# Patient Record
Sex: Female | Born: 1950 | Race: White | Hispanic: No | Smoking: Former smoker
Health system: Southern US, Community
[De-identification: ages and names within clinical notes are randomized; demographics above are authoritative.]

## PROBLEM LIST (undated history)

## (undated) ENCOUNTER — Ambulatory Visit: Admission: EM | Payer: Medicare PPO | Source: Home / Self Care

## (undated) DIAGNOSIS — M1990 Unspecified osteoarthritis, unspecified site: Secondary | ICD-10-CM

## (undated) DIAGNOSIS — C801 Malignant (primary) neoplasm, unspecified: Secondary | ICD-10-CM

## (undated) DIAGNOSIS — I472 Ventricular tachycardia, unspecified: Secondary | ICD-10-CM

## (undated) DIAGNOSIS — I10 Essential (primary) hypertension: Secondary | ICD-10-CM

## (undated) DIAGNOSIS — Z87442 Personal history of urinary calculi: Secondary | ICD-10-CM

## (undated) DIAGNOSIS — H3530 Unspecified macular degeneration: Secondary | ICD-10-CM

## (undated) DIAGNOSIS — G2581 Restless legs syndrome: Secondary | ICD-10-CM

## (undated) DIAGNOSIS — F329 Major depressive disorder, single episode, unspecified: Secondary | ICD-10-CM

## (undated) DIAGNOSIS — F32A Depression, unspecified: Secondary | ICD-10-CM

## (undated) DIAGNOSIS — E7800 Pure hypercholesterolemia, unspecified: Secondary | ICD-10-CM

## (undated) DIAGNOSIS — R011 Cardiac murmur, unspecified: Secondary | ICD-10-CM

## (undated) DIAGNOSIS — F419 Anxiety disorder, unspecified: Secondary | ICD-10-CM

## (undated) DIAGNOSIS — G4733 Obstructive sleep apnea (adult) (pediatric): Secondary | ICD-10-CM

## (undated) DIAGNOSIS — N200 Calculus of kidney: Secondary | ICD-10-CM

## (undated) DIAGNOSIS — T884XXA Failed or difficult intubation, initial encounter: Secondary | ICD-10-CM

## (undated) HISTORY — DX: Unspecified macular degeneration: H35.30

## (undated) HISTORY — DX: Restless legs syndrome: G25.81

## (undated) HISTORY — DX: Major depressive disorder, single episode, unspecified: F32.9

## (undated) HISTORY — DX: Unspecified osteoarthritis, unspecified site: M19.90

## (undated) HISTORY — DX: Anxiety disorder, unspecified: F41.9

## (undated) HISTORY — DX: Pure hypercholesterolemia, unspecified: E78.00

## (undated) HISTORY — DX: Cardiac murmur, unspecified: R01.1

## (undated) HISTORY — PX: OTHER SURGICAL HISTORY: SHX169

## (undated) HISTORY — PX: REPLACEMENT TOTAL KNEE BILATERAL: SUR1225

## (undated) HISTORY — DX: Obstructive sleep apnea (adult) (pediatric): G47.33

## (undated) HISTORY — DX: Essential (primary) hypertension: I10

## (undated) HISTORY — PX: APPENDECTOMY: SHX54

## (undated) HISTORY — DX: Depression, unspecified: F32.A

## (undated) HISTORY — PX: ABDOMINAL HYSTERECTOMY: SHX81

## (undated) HISTORY — DX: Calculus of kidney: N20.0

## (undated) HISTORY — PX: CHOLECYSTECTOMY: SHX55

---

## 2014-02-13 DIAGNOSIS — D803 Selective deficiency of immunoglobulin G [IgG] subclasses: Secondary | ICD-10-CM | POA: Insufficient documentation

## 2014-02-13 DIAGNOSIS — Z87442 Personal history of urinary calculi: Secondary | ICD-10-CM | POA: Insufficient documentation

## 2014-02-13 DIAGNOSIS — G2581 Restless legs syndrome: Secondary | ICD-10-CM | POA: Insufficient documentation

## 2014-02-13 DIAGNOSIS — J45901 Unspecified asthma with (acute) exacerbation: Secondary | ICD-10-CM | POA: Insufficient documentation

## 2014-02-13 DIAGNOSIS — Z9989 Dependence on other enabling machines and devices: Secondary | ICD-10-CM

## 2014-02-13 DIAGNOSIS — G4733 Obstructive sleep apnea (adult) (pediatric): Secondary | ICD-10-CM | POA: Insufficient documentation

## 2014-02-13 DIAGNOSIS — M1990 Unspecified osteoarthritis, unspecified site: Secondary | ICD-10-CM | POA: Insufficient documentation

## 2014-02-13 DIAGNOSIS — J45909 Unspecified asthma, uncomplicated: Secondary | ICD-10-CM | POA: Insufficient documentation

## 2014-02-13 DIAGNOSIS — E782 Mixed hyperlipidemia: Secondary | ICD-10-CM | POA: Insufficient documentation

## 2014-02-13 DIAGNOSIS — I10 Essential (primary) hypertension: Secondary | ICD-10-CM | POA: Insufficient documentation

## 2014-10-03 DIAGNOSIS — F3341 Major depressive disorder, recurrent, in partial remission: Secondary | ICD-10-CM | POA: Insufficient documentation

## 2014-10-03 DIAGNOSIS — F331 Major depressive disorder, recurrent, moderate: Secondary | ICD-10-CM | POA: Insufficient documentation

## 2014-10-13 DIAGNOSIS — E7212 Methylenetetrahydrofolate reductase deficiency: Secondary | ICD-10-CM | POA: Insufficient documentation

## 2014-10-13 DIAGNOSIS — Z1589 Genetic susceptibility to other disease: Secondary | ICD-10-CM | POA: Insufficient documentation

## 2015-02-11 DIAGNOSIS — F17200 Nicotine dependence, unspecified, uncomplicated: Secondary | ICD-10-CM | POA: Insufficient documentation

## 2015-05-14 DIAGNOSIS — E66813 Obesity, class 3: Secondary | ICD-10-CM | POA: Insufficient documentation

## 2015-09-02 DIAGNOSIS — R7301 Impaired fasting glucose: Secondary | ICD-10-CM | POA: Insufficient documentation

## 2016-09-07 ENCOUNTER — Encounter (HOSPITAL_COMMUNITY): Payer: Self-pay

## 2016-09-07 ENCOUNTER — Encounter (HOSPITAL_COMMUNITY): Payer: Self-pay | Admitting: Psychology

## 2016-09-07 ENCOUNTER — Ambulatory Visit (INDEPENDENT_AMBULATORY_CARE_PROVIDER_SITE_OTHER): Payer: Medicare Other | Admitting: Psychology

## 2016-09-07 DIAGNOSIS — F331 Major depressive disorder, recurrent, moderate: Secondary | ICD-10-CM | POA: Diagnosis not present

## 2016-09-07 DIAGNOSIS — Z634 Disappearance and death of family member: Secondary | ICD-10-CM | POA: Diagnosis not present
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
## 2016-09-21 ENCOUNTER — Ambulatory Visit (INDEPENDENT_AMBULATORY_CARE_PROVIDER_SITE_OTHER): Payer: Medicare Other | Admitting: Psychology

## 2016-09-21 DIAGNOSIS — F331 Major depressive disorder, recurrent, moderate: Secondary | ICD-10-CM | POA: Diagnosis not present

## 2016-09-21 DIAGNOSIS — Z634 Disappearance and death of family member: Secondary | ICD-10-CM

## 2016-09-22 NOTE — Progress Notes (Signed)
   THERAPIST PROGRESS NOTE  Session Time: 1.30pm-2.30pm  Participation Level: Active  Behavioral Response: Well GroomedAlertDepressed  Type of Therapy: Individual Therapy  Treatment Goals addressed: Diagnosis: MDD and goal 1.  Interventions: CBT and Other: tx planning  Summary: Alice Bowers is a 66 y.o. female who presents with affect WNL.  Pt discussed goals for tx and tx plan developed.   Pt reported that she is now staying in her rental in [place]- but still in the process of moving boxes from last rental.  Pt reported that she had several friends state would help but didn't- son in law has assisted in moving furniture.  Pt discussed how down sizing.  Pt discussed that her granddaughter was excited to see her house and her room.  Pt is staying connected w/ them- but fears that after a month will drop off. Pt is keeping communication open and offering support of being present at her biking events- plans to go this weekend w/ them.  Pt discussed that best friend is in ICU and her immediate response when she found out was to text her daughter and then realized she couldn't.  Pt discussed how this was difficult.  Pt discussed how her faith is being challenged- feels that prayers weren't answered.  Pt reports she did go to her daughter's church this weekend on a whim an did feel support here.     Suicidal/Homicidal: Nowithout intent/plan  Therapist Response:   Assessed pt current functioning per pt report.  Developed tx plan. Explored w/pt interactions over past couple of weeks.  Discussed stressors of move and supports.  Processed grieving and how faith is being challenged and still seeking support.  Validated and normalized.  Plan: Return again in 2 weeks.  Diagnosis: MDD    Jan Fireman, [facility] 09/22/2016

## 2016-10-04 ENCOUNTER — Ambulatory Visit (INDEPENDENT_AMBULATORY_CARE_PROVIDER_SITE_OTHER): Payer: Medicare Other | Admitting: Psychology

## 2016-10-04 DIAGNOSIS — F331 Major depressive disorder, recurrent, moderate: Secondary | ICD-10-CM | POA: Diagnosis not present

## 2016-10-04 DIAGNOSIS — Z634 Disappearance and death of family member: Secondary | ICD-10-CM

## 2016-10-05 NOTE — Progress Notes (Signed)
   THERAPIST PROGRESS NOTE  Session Time: 1.30pm-2.30pm  Participation Level: Active  Behavioral Response: Well GroomedAlertaffect wnl  Type of Therapy: Individual Therapy  Treatment Goals addressed: Diagnosis: MDD and goal 1.  Interventions: CBT and Supportive  Summary: Blakelee Certo is a 66 y.o. female who presents with report of feeling tired today.  Pt discussed how work has been stressful this week w/ increased volume.  Pt reported that work has been having her stay longer than her 4 hours to help- pt reports doesn't mind.  Pt reported that had positive weekend w/ her granddaughter and son-in-law at [employer] day.  Pt discussed how she has good days and bad days w/ mood and grief.  Pt reported that she slept through the night last night for the first time in months.  Pt has moved the majority of her things to new house and positive outlook about process of unpacking w/ reality that will take awhile.  Pt discussed her grieving of daughter and seeing this present for her granddaughter and son in law.  Pt discussed importance of remaining a support and feeling the support from the community her daughter was known in.   Suicidal/Homicidal: Nowithout intent/plan  Therapist Response: Assessed pt current functioning per pt report.  Processed w/pt her continued transition w/ move.  Explored w/ pt interactions w/ her family and how supporting each other through grief.  Discussed  her grieving process.   Plan: Return again in 2 weeks.  Diagnosis: MDD    Jan Fireman, [facility] 10/05/2016

## 2016-10-06 ENCOUNTER — Encounter (HOSPITAL_COMMUNITY): Payer: Self-pay | Admitting: Psychiatry

## 2016-10-06 ENCOUNTER — Ambulatory Visit (INDEPENDENT_AMBULATORY_CARE_PROVIDER_SITE_OTHER): Payer: Medicare Other | Admitting: Psychiatry

## 2016-10-06 VITALS — BP 138/82 | HR 85 | Ht 64.0 in | Wt 240.8 lb

## 2016-10-06 DIAGNOSIS — Z9889 Other specified postprocedural states: Secondary | ICD-10-CM

## 2016-10-06 DIAGNOSIS — Z818 Family history of other mental and behavioral disorders: Secondary | ICD-10-CM | POA: Diagnosis not present

## 2016-10-06 DIAGNOSIS — Z9049 Acquired absence of other specified parts of digestive tract: Secondary | ICD-10-CM | POA: Diagnosis not present

## 2016-10-06 DIAGNOSIS — Z882 Allergy status to sulfonamides status: Secondary | ICD-10-CM

## 2016-10-06 DIAGNOSIS — Z90710 Acquired absence of both cervix and uterus: Secondary | ICD-10-CM

## 2016-10-06 DIAGNOSIS — F17210 Nicotine dependence, cigarettes, uncomplicated: Secondary | ICD-10-CM

## 2016-10-06 DIAGNOSIS — Z79899 Other long term (current) drug therapy: Secondary | ICD-10-CM

## 2016-10-06 DIAGNOSIS — F331 Major depressive disorder, recurrent, moderate: Secondary | ICD-10-CM | POA: Diagnosis not present

## 2016-10-06 DIAGNOSIS — Z888 Allergy status to other drugs, medicaments and biological substances status: Secondary | ICD-10-CM

## 2016-10-06 MED ORDER — BUPROPION HCL ER (SR) 150 MG PO TB12: ORAL_TABLET | ORAL | 1 refills | Status: DC

## 2016-10-06 MED ORDER — ESCITALOPRAM OXALATE 20 MG PO TABS: 20.0000 mg | ORAL_TABLET | Freq: Every day | ORAL | 1 refills | Status: DC
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
## 2016-10-13 ENCOUNTER — Ambulatory Visit (HOSPITAL_COMMUNITY): Payer: Medicare Other | Admitting: Psychology

## 2016-10-27 ENCOUNTER — Ambulatory Visit (HOSPITAL_COMMUNITY): Payer: Medicare Other | Admitting: Psychology

## 2016-11-01 ENCOUNTER — Encounter (HOSPITAL_COMMUNITY): Payer: Self-pay | Admitting: Psychiatry

## 2016-11-10 ENCOUNTER — Ambulatory Visit (INDEPENDENT_AMBULATORY_CARE_PROVIDER_SITE_OTHER): Payer: Medicare Other | Admitting: Psychology

## 2016-11-10 DIAGNOSIS — Z634 Disappearance and death of family member: Secondary | ICD-10-CM | POA: Diagnosis not present

## 2016-11-10 DIAGNOSIS — F331 Major depressive disorder, recurrent, moderate: Secondary | ICD-10-CM

## 2016-11-10 NOTE — Progress Notes (Signed)
   THERAPIST PROGRESS NOTE  Session Time: 1.30pm-2.23pm  Participation Level: Active  Behavioral Response: Well GroomedAlertDepressed  Type of Therapy: Individual Therapy  Treatment Goals addressed: Diagnosis: MDD, Bereavment and goal 1.  Interventions: CBT and Supportive  Summary: Alice Bowers is a 66 y.o. female who presents with report of being tired- pt reported has been working increased hours for past several weeks.  Pt reports not a lot of energy to get things done around house. Pt reported that she has continued to stay engaged w/ her granddauther and son-in-law.  Pt reported this is positive and able to talk about daughter w/ granddaughter.  Pt reported thinking a lot about her daughter over past 2 weeks and acknowledges this as norm.  Pt did report new stressor dx w/ macular degeneration-  Some worry about future and lack of support to care for potential needs in future.   Pt looking forward to sibling beach trip.  Suicidal/Homicidal: Nowithout intent/plan  Therapist Response: Assessed pt current functioning per pt report.  Processed w/ pt mood, grief and new medical stressor.  Reflected positive interactions and continued engagement and benefit.  Plan: Return again in 2 weeks.  Diagnosis: MDD    Jan Fireman, [facility] 11/10/2016

## 2016-11-22 ENCOUNTER — Encounter (HOSPITAL_COMMUNITY): Payer: Self-pay | Admitting: Psychiatry

## 2016-11-22 ENCOUNTER — Ambulatory Visit (INDEPENDENT_AMBULATORY_CARE_PROVIDER_SITE_OTHER): Payer: Medicare Other | Admitting: Psychiatry

## 2016-11-22 DIAGNOSIS — F331 Major depressive disorder, recurrent, moderate: Secondary | ICD-10-CM | POA: Diagnosis not present

## 2016-11-22 DIAGNOSIS — Z818 Family history of other mental and behavioral disorders: Secondary | ICD-10-CM | POA: Diagnosis not present

## 2016-11-22 DIAGNOSIS — F17210 Nicotine dependence, cigarettes, uncomplicated: Secondary | ICD-10-CM | POA: Diagnosis not present

## 2016-11-22 DIAGNOSIS — Z79899 Other long term (current) drug therapy: Secondary | ICD-10-CM | POA: Diagnosis not present

## 2016-11-22 MED ORDER — ESCITALOPRAM OXALATE 20 MG PO TABS: 20.0000 mg | ORAL_TABLET | Freq: Every day | ORAL | 2 refills | Status: DC

## 2016-11-22 MED ORDER — BUPROPION HCL ER (SR) 150 MG PO TB12: ORAL_TABLET | ORAL | 2 refills | Status: DC

## 2016-11-22 NOTE — Progress Notes (Signed)
[facility] MD/PA/NP OP Progress Note  11/22/2016 2:08 PM Alice Bowers  MRN:  [id number]  Chief Complaint:  Subjective:  I am doing okay.  Some time I cry when I remember my daughter who died in Sep 11, 2023.  I was also recently diagnosed with macular degeneration and I'm getting injection in my eye every 4 weeks.  HPI: Alice Bowers came for her follow-up appointment.  She is 66 year old Caucasian widowed part-time employed female who was seen first time on February 22nd .  Patient has a history of depression and she's been taking Wellbutrin and Lexapro prescribed by her previous psychiatrist Dr. Philippa Bowers.  Her psychiatrist left the practice.  Overall patient describes her depression is a stable but sometimes she didn't get sad depressed and tearful when she remember her daughter who died in 09-11-2023 due to leukemia.  She was also diagnosed 4 weeks ago with macular degeneration and getting injection in her eye every month.  Sometimes she gets very anxious and depressed about her future but she believed medicine helping her.  She is seeing Alice Bowers for counseling.  Her energy level is fair.  She sleeping good.  She denies any irritability, anger, mania or any psychosis.  Patient has multiple health issues which include restless leg, sleep apnea, hypertension, obesity, hyperlipidemia and recently diagnosed with macular degeneration.  Patient does not want to change her medication.  She does not want to add or reduce the dose.  She has no tremors shakes or any EPS.  She denies any feeling of hopelessness or worthlessness.  Patient is very close to her son-in-law and 2-year-old granddaughter who lives in [place].  Patient has a son who lives in [place].  Patient lives by herself.  Patient denies drinking alcohol or using any illegal substances.  Visit Diagnosis:    ICD-9-CM ICD-10-CM   1. Major depressive disorder, recurrent episode, moderate (HCC) 296.32 F33.1 buPROPion (WELLBUTRIN SR) 150 MG 12 hr tablet   escitalopram (LEXAPRO) 20 MG tablet    Past Psychiatric History: Patient endorse history of depression since 1995.  At that time she was going through divorce.  She has at least 4 psychiatric hospitalization.  She has one suicidal attempt by taking overdose on Seroquel.  Patient denies any history of paranoia, hallucination or any aggressive behavior.  In the past she had tried Paxil, Zoloft and Seroquel with limited response.  She tried Remeron but cause weight gain.    Past Medical History:  Past Medical History:  Diagnosis Date  . Anxiety   . Arthritis   . Depression   . Heart murmur   . High blood pressure   . High cholesterol   . Kidney stones   . Macular degeneration of left eye   . Obstructive sleep apnea   . Restless leg syndrome     Past Surgical History:  Procedure Laterality Date  . ABDOMINAL HYSTERECTOMY    . APPENDECTOMY    . arthroscopy left shoulder    . CHOLECYSTECTOMY    . joint fusion bilateral thumbs    . ORIF right ankle    . REPLACEMENT TOTAL KNEE BILATERAL      Family Psychiatric History: Reviewed.   Family History:  Family History  Problem Relation Age of Onset  . Depression Mother   . Depression Brother     Social History:  Social History   Social History  . Marital status: Widowed    Spouse name: N/A  . Number of children: N/A  . Years of education:  N/A   Social History Main Topics  . Smoking status: Current Every Day Smoker    Packs/day: 1.00    Types: Cigarettes  . Smokeless tobacco: Never Used  . Alcohol use Yes     Comment: 1-2 drinks per month  . Drug use: No  . Sexual activity: Not Currently   Other Topics Concern  . None   Social History Narrative  . None    Allergies:  Allergies  Allergen Reactions  . Azo [Phenazopyridine]   . Cephalosporins   . Demerol [Meperidine]   . Edta [Edetic Acid]   . Macrobid [Nitrofurantoin Macrocrystal]   . Sulfa Antibiotics     Metabolic Disorder Labs: No results found for:  HGBA1C, MPG No results found for: PROLACTIN No results found for: CHOL, TRIG, HDL, CHOLHDL, VLDL, LDLCALC   Current Medications: Current Outpatient Prescriptions  Medication Sig Dispense Refill  . albuterol (ACCUNEB) 1.25 MG/3ML nebulizer solution Take 1 ampule by nebulization every 6 (six) hours as needed for wheezing.    . Albuterol Sulfate 108 (90 Base) MCG/ACT AEPB Inhale into the lungs.    Marland Kitchen allopurinol (ZYLOPRIM) 300 MG tablet Take 300 mg by mouth daily.    Marland Kitchen amLODipine (NORVASC) 5 MG tablet Take 5 mg by mouth daily.    . Armodafinil (NUVIGIL) 150 MG tablet Take 150 mg by mouth daily.    Marland Kitchen atorvastatin (LIPITOR) 40 MG tablet Take 40 mg by mouth daily.    Marland Kitchen buPROPion (WELLBUTRIN SR) 150 MG 12 hr tablet Take 2 in am and 1 at bed time 90 tablet 1  . escitalopram (LEXAPRO) 20 MG tablet Take 1 tablet (20 mg total) by mouth daily. 30 tablet 1  . fluticasone furoate-vilanterol (BREO ELLIPTA) 200-25 MCG/INH AEPB Inhale 1 puff into the lungs daily.    . folic acid (FOLVITE) 1 MG tablet Take 1 mg by mouth daily.    . pramipexole (MIRAPEX) 0.5 MG tablet Take 0.5 mg by mouth 2 (two) times daily.    . valsartan-hydrochlorothiazide (DIOVAN-HCT) 320-25 MG tablet Take 1 tablet by mouth daily.     No current facility-administered medications for this visit.     Neurologic: Headache: No Seizure: No Paresthesias: No  Musculoskeletal: Strength & Muscle Tone: within normal limits Gait & Station: normal Patient leans: N/A  Psychiatric Specialty Exam: ROS  Blood pressure 138/80, pulse 93, height 5\' 4"  (1.626 m), weight 246 lb 4.8 oz (111.7 kg), SpO2 95 %.Body mass index is 42.28 kg/m.  General Appearance: Casual  Eye Contact:  Good  Speech:  Clear and Coherent  Volume:  Normal  Mood:  Dysphoric  Affect:  Congruent  Thought Process:  Goal Directed  Orientation:  Full (Time, Place, and Person)  Thought Content: Rumination   Suicidal Thoughts:  No  Homicidal Thoughts:  No  Memory:   Immediate;   Good Recent;   Good Remote;   Good  Judgement:  Good  Insight:  Good  Psychomotor Activity:  Normal  Concentration:  Concentration: Good and Attention Span: Good  Recall:  Good  Fund of Knowledge: Good  Language: Good  Akathisia:  No  Handed:  Right  AIMS (if indicated):  0  Assets:  Communication Skills Desire for Improvement Housing Resilience  ADL's:  Intact  Cognition: WNL  Sleep:  Good   Assessment: Major depressive disorder, recurrent.  Plan: Patient is a stable on Wellbutrin and Lexapro.  She does not have any side effects.  She is seen Archie Endo for counseling.  She does not want to change or reduce the dose.  I will continue Lexapro 20 mg daily, Wellbutrin SR 300 mg in the morning and 150 in the evening.  Discussed medication side effects and benefits.  Recommended to call us back if she has any question, concern or if she feels worsening of the symptom.  Follow-up in 3 months.    Haidyn Kilburg T., MD 11/22/2016, 2:08 PM

## 2016-12-05 ENCOUNTER — Ambulatory Visit (INDEPENDENT_AMBULATORY_CARE_PROVIDER_SITE_OTHER): Payer: Medicare Other | Admitting: Psychology

## 2016-12-05 DIAGNOSIS — Z634 Disappearance and death of family member: Secondary | ICD-10-CM | POA: Diagnosis not present

## 2016-12-05 DIAGNOSIS — F331 Major depressive disorder, recurrent, moderate: Secondary | ICD-10-CM

## 2016-12-06 NOTE — Progress Notes (Signed)
   THERAPIST PROGRESS NOTE  Session Time: 1.30pm-2.20pm  Participation Level: Active  Behavioral Response: Well GroomedAlertDepressed  Type of Therapy: Individual Therapy  Treatment Goals addressed: Diagnosis: MDD and goal 1.  Interventions: CBT  Summary: Alice Bowers is a 66 y.o. female who presents with report of depressed mood and affect congruent.  Pt reports that she hasn't been feeling like doing anything when at home.  Pt is still working, traveling w/ granddaughter for dirt bike racing on weekends.  Pt acknowledges distortions of not wanted at these weekends and feeling left out.  Pt reports that she has been thinking a lot about her deceased daughter with her birthday this Friday. Pt talked about enjoying her visit w/ sister although trip to beach ended up very short.     Suicidal/Homicidal: Nowithout intent/plan  Therapist Response: Assessed pt current functioning per pt report.  Processed w/pt increased loss of interest and depressed mood.  Reflected pt grief and discussed interactions w/ family.  Assisted pt in reframing distortions and focusing on connections that are enjoyable to her w/ family  Plan: Return again in 2 weeks.  Diagnosis: MDD    Jan Fireman, [facility] 12/06/2016

## 2016-12-08 ENCOUNTER — Emergency Department (HOSPITAL_COMMUNITY)
Admission: EM | Admit: 2016-12-08 | Discharge: 2016-12-08 | Disposition: A | Discharge status: Home / Self Care | Payer: Medicare Other | Attending: Emergency Medicine | Admitting: Emergency Medicine

## 2016-12-08 ENCOUNTER — Emergency Department (HOSPITAL_COMMUNITY): Payer: Medicare Other

## 2016-12-08 ENCOUNTER — Encounter (HOSPITAL_COMMUNITY): Payer: Self-pay

## 2016-12-08 ENCOUNTER — Ambulatory Visit (HOSPITAL_COMMUNITY)
Admission: EM | Admit: 2016-12-08 | Discharge: 2016-12-08 | Disposition: A | Discharge status: Home / Self Care | Payer: Medicare Other | Source: Home / Self Care

## 2016-12-08 DIAGNOSIS — Z5321 Procedure and treatment not carried out due to patient leaving prior to being seen by health care provider: Secondary | ICD-10-CM | POA: Insufficient documentation

## 2016-12-08 DIAGNOSIS — M25512 Pain in left shoulder: Secondary | ICD-10-CM | POA: Insufficient documentation

## 2016-12-08 LAB — BASIC METABOLIC PANEL
Anion gap: 11 (ref 5–15)
BUN: 20 mg/dL (ref 6–20)
CHLORIDE: 103 mmol/L (ref 101–111)
CO2: 23 mmol/L (ref 22–32)
CREATININE: 1.28 mg/dL — AB (ref 0.44–1.00)
Calcium: 10.2 mg/dL (ref 8.9–10.3)
GFR calc Af Amer: 50 mL/min — ABNORMAL LOW (ref >60)
GFR calc non Af Amer: 43 mL/min — ABNORMAL LOW (ref >60)
GLUCOSE: 114 mg/dL — AB (ref 65–99)
POTASSIUM: 3.8 mmol/L (ref 3.5–5.1)
Sodium: 137 mmol/L (ref 135–145)

## 2016-12-08 LAB — I-STAT TROPONIN, ED: Troponin i, poc: 0 ng/mL (ref 0.00–0.08)

## 2016-12-08 LAB — CBC
HEMATOCRIT: 43.5 % (ref 36.0–46.0)
Hemoglobin: 14.5 g/dL (ref 12.0–15.0)
MCH: 29.9 pg (ref 26.0–34.0)
MCHC: 33.3 g/dL (ref 30.0–36.0)
MCV: 89.7 fL (ref 78.0–100.0)
Platelets: 338 10*3/uL (ref 150–400)
RBC: 4.85 MIL/uL (ref 3.87–5.11)
RDW: 14.4 % (ref 11.5–15.5)
WBC: 11 10*3/uL — AB (ref 4.0–10.5)

## 2016-12-08 NOTE — ED Notes (Signed)
Patient called multiple times in lobby, no answer

## 2016-12-08 NOTE — ED Notes (Signed)
Seen in Triage by Loura Halt, NP student, PT sent to ED via [facility] shuttle.

## 2016-12-08 NOTE — ED Triage Notes (Signed)
Patient complains of left shoulder pain with nausea and weakness that has gotten worse for 2 weeks. Today the pain a little longer with the above mentioned symptoms. Ongoing weakness on arrival

## 2016-12-08 NOTE — ED Notes (Signed)
Called pt's name in lobby x 2 for bed placement, unable to locate.

## 2016-12-12 ENCOUNTER — Telehealth (HOSPITAL_COMMUNITY): Payer: Self-pay

## 2016-12-12 ENCOUNTER — Other Ambulatory Visit (HOSPITAL_COMMUNITY): Payer: Self-pay | Admitting: Psychiatry

## 2016-12-12 DIAGNOSIS — F331 Major depressive disorder, recurrent, moderate: Secondary | ICD-10-CM

## 2016-12-12 MED ORDER — BUPROPION HCL ER (SR) 150 MG PO TB12: ORAL_TABLET | ORAL | 0 refills | Status: DC

## 2016-12-12 NOTE — Telephone Encounter (Signed)
Okay to fill 90 day supply

## 2016-12-12 NOTE — Telephone Encounter (Signed)
Per Dr. Adele Schilder, a 90 day order of Bupropion was sent to [facility] in [place]

## 2016-12-12 NOTE — Telephone Encounter (Signed)
Medication refill - Fax received from patient's [facility] in [place] with request for 90 day prescription for her Bupropion.

## 2016-12-15 ENCOUNTER — Other Ambulatory Visit (HOSPITAL_COMMUNITY): Payer: Self-pay | Admitting: Psychiatry

## 2016-12-15 ENCOUNTER — Telehealth (HOSPITAL_COMMUNITY): Payer: Self-pay

## 2016-12-15 NOTE — Telephone Encounter (Signed)
She is already taking maximum dose of Lexapro and Wellbutrin.  These medicine cannot be further increase but we can try addition with the medication and she need to be seen before we do that.

## 2016-12-15 NOTE — Telephone Encounter (Signed)
Patient is calling for a medication change or increase. She said that it was discussed in her last visit, but she did not want to change at that time. Patient states that depression is getting worse and she thinks she may need a change. She wants to know if she should come back in - her current follow up is 7/11. Please review and advise, thank you

## 2016-12-20 NOTE — Telephone Encounter (Signed)
I called back and spoke to patient, she is scheduled to come in Monday 5/14.

## 2016-12-22 ENCOUNTER — Ambulatory Visit (INDEPENDENT_AMBULATORY_CARE_PROVIDER_SITE_OTHER): Payer: Medicare Other | Admitting: Psychology

## 2016-12-22 DIAGNOSIS — F331 Major depressive disorder, recurrent, moderate: Secondary | ICD-10-CM

## 2016-12-22 DIAGNOSIS — Z634 Disappearance and death of family member: Secondary | ICD-10-CM | POA: Diagnosis not present

## 2016-12-22 NOTE — Progress Notes (Signed)
   THERAPIST PROGRESS NOTE  Session Time: 2.28pm-3.14pm  Participation Level: Active  Behavioral Response: Well GroomedAlertDepressed  Type of Therapy: Individual Therapy  Treatment Goals addressed: Diagnosis: MDD and goal 1.  Interventions: CBT and Supportive  Summary: Alice Bowers is a 66 y.o. female who presents with depressed mood and affect.  Pt has a walking boot on her left foot and reports dx w/ tendonitis. Pt is struggling w/ daily depressed mood and not wanting to do anything beside sitting down/in bed.  Pt reports that struggling financially and extra medical issue resulting in further bills she is unsure of how to pay.  Pt did accept information for food banks in her area as discussing having to cut out certain meds to pay for other bills.  Pt discussed that she hasn't talked w/ her granddaugther or son in law since 12/08/16 and is bothered by this.  She reports she has tried to call but calls haven't been returned.  Pt reports she's worried "what did I do" that ignoring.  Pt identified that could be several things unrelated and continuing to reach out and will show up this weekend at scheduled race event.  Pt discussed that she did met w/ granddaughter on daughter birthday to make cupcakes in honor of her and this was positive.    Suicidal/Homicidal: Nowithout intent/plan  Therapist Response: Assessed pt current functioning per pt report.  Processed w/ pt her worry re: lack of contact.  Validated her feelings and assisted pt in reframing and focusing on her continued interactions.  Encouraged continued interactions w/ other connections in area and other family as well.    Plan: Return again in 2 weeks.  Diagnosis: MDD, recurrent    YATES,LEANNE, [place] 12/22/2016

## 2016-12-26 ENCOUNTER — Ambulatory Visit (INDEPENDENT_AMBULATORY_CARE_PROVIDER_SITE_OTHER): Payer: Medicare Other | Admitting: Psychiatry

## 2016-12-26 ENCOUNTER — Encounter (HOSPITAL_COMMUNITY): Payer: Self-pay | Admitting: Psychiatry

## 2016-12-26 VITALS — BP 140/84 | HR 86 | Ht 64.0 in | Wt 244.0 lb

## 2016-12-26 DIAGNOSIS — Z79899 Other long term (current) drug therapy: Secondary | ICD-10-CM | POA: Diagnosis not present

## 2016-12-26 DIAGNOSIS — Z818 Family history of other mental and behavioral disorders: Secondary | ICD-10-CM

## 2016-12-26 DIAGNOSIS — F331 Major depressive disorder, recurrent, moderate: Secondary | ICD-10-CM

## 2016-12-26 DIAGNOSIS — F17210 Nicotine dependence, cigarettes, uncomplicated: Secondary | ICD-10-CM

## 2016-12-26 MED ORDER — LAMOTRIGINE 25 MG PO TABS: ORAL_TABLET | ORAL | 0 refills | Status: DC

## 2016-12-26 NOTE — Progress Notes (Signed)
[facility] MD/PA/NP OP Progress Note  12/26/2016 8:44 AM Alice Bowers  MRN:  [id number]  Chief Complaint:  Subjective:  I don't think that I am doing well.  I'm having crying spells.  I have no motivation to do things.  HPI: Shelah came for her follow-up appointment earlier than her scheduled time.  Alice is complaining of increased depression, lack of motivation, lack of energy and having crying spells.  Patient told when Alice comes from the work Alice does not do anything I like to go to bed.  Alice admitted lately more isolated and withdrawn.  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  Alice also very sad because Alice has been difficulty seeing her 65-year-old granddaughter.  Patient continues to mourn about her daughter who died in 2022/09/22 after leukemia.  Alice is very concerned about her physical health.  Alice was diagnosed with macular degeneration and getting injections every 4 week and her aunt.  Alice admitted getting easily tired, lack of energy, lack of motivation.  However Alice denies any suicidal thoughts, mania, irritability, hallucination or any paranoia.  Alice is taking Wellbutrin and Lexapro.  Alice has no tremors or shakes.  Recently Alice had tendinitis in her left foot and now Alice is having braces.  Alice has difficulty walking.  Patient lives by herself.  Alice had a son who lives in [place].  Patient denies drinking alcohol or using any illegal substances.  Alice seen Alice Bowers for counseling.  Visit Diagnosis:    ICD-9-CM ICD-10-CM   1. Major depressive disorder, recurrent episode, moderate (HCC) 296.32 F33.1 lamoTRIgine (LAMICTAL) 25 MG tablet    Past Psychiatric History: Reviewed. Patient endorse history of depression since 1995.  At that time Alice was going through divorce.  Alice has at least 4 psychiatric hospitalization.  Alice has one suicidal attempt by taking overdose on Seroquel.  Patient denies  any history of paranoia, hallucination or any aggressive behavior.  In the past Alice had tried Paxil, Zoloft, Remeron and Seroquel with limited response.  Alice tried Remeron but cause weight gain.    Past Medical History:  Past Medical History:  Diagnosis Date  . Anxiety   . Arthritis   . Depression   . Heart murmur   . High blood pressure   . High cholesterol   . Kidney stones   . Macular degeneration of left eye   . Obstructive sleep apnea   . Restless leg syndrome     Past Surgical History:  Procedure Laterality Date  . ABDOMINAL HYSTERECTOMY    . APPENDECTOMY    . arthroscopy left shoulder    . CHOLECYSTECTOMY    . joint fusion bilateral thumbs    . ORIF right ankle    . REPLACEMENT TOTAL KNEE BILATERAL      Family Psychiatric History: Reviewed.  Family History:  Family History  Problem Relation Age of Onset  . Depression Mother   . Depression Brother     Social History:  Social History   Social History  . Marital status: Widowed    Spouse name: N/A  . Number of children: N/A  . Years of education: N/A   Social History Main Topics  . Smoking status: Current Every Day Smoker    Packs/day: 1.00    Types: Cigarettes  . Smokeless tobacco: Never Used  . Alcohol use Yes     Comment: 1-2 drinks per  month  . Drug use: No  . Sexual activity: Not Currently   Other Topics Concern  . Not on file   Social History Narrative  . No narrative on file    Allergies:  Allergies  Allergen Reactions  . Azo [Phenazopyridine]   . Cephalosporins   . Demerol [Meperidine]   . Edta [Edetic Acid]   . Macrobid [Nitrofurantoin Macrocrystal]   . Sulfa Antibiotics     Metabolic Disorder Labs: No results found for: HGBA1C, MPG No results found for: PROLACTIN No results found for: CHOL, TRIG, HDL, CHOLHDL, VLDL, LDLCALC   Current Medications: Current Outpatient Prescriptions  Medication Sig Dispense Refill  . albuterol (ACCUNEB) 1.25 MG/3ML nebulizer solution Take 1  ampule by nebulization every 6 (six) hours as needed for wheezing.    . Albuterol Sulfate 108 (90 Base) MCG/ACT AEPB Inhale into the lungs.    Marland Kitchen allopurinol (ZYLOPRIM) 300 MG tablet Take 300 mg by mouth daily.    Marland Kitchen amLODipine (NORVASC) 5 MG tablet Take 5 mg by mouth daily.    . Armodafinil (NUVIGIL) 150 MG tablet Take 150 mg by mouth daily.    Marland Kitchen atorvastatin (LIPITOR) 40 MG tablet Take 40 mg by mouth daily.    Marland Kitchen buPROPion (WELLBUTRIN SR) 150 MG 12 hr tablet Take 2 in am and 1 at bed time 270 tablet 0  . escitalopram (LEXAPRO) 20 MG tablet Take 1 tablet (20 mg total) by mouth daily. 30 tablet 2  . fluticasone furoate-vilanterol (BREO ELLIPTA) 200-25 MCG/INH AEPB Inhale 1 puff into the lungs daily.    . folic acid (FOLVITE) 1 MG tablet Take 1 mg by mouth daily.    . pramipexole (MIRAPEX) 0.5 MG tablet Take 0.5 mg by mouth 2 (two) times daily.    . valsartan-hydrochlorothiazide (DIOVAN-HCT) 320-25 MG tablet Take 1 tablet by mouth daily.     No current facility-administered medications for this visit.     Neurologic: Headache: No Seizure: No Paresthesias: No  Musculoskeletal: Strength & Muscle Tone: decreased Gait & Station: Patient has left ankle braces Patient leans: Right  Psychiatric Specialty Exam: Review of Systems  Constitutional: Negative.   HENT: Negative.   Respiratory: Negative.   Cardiovascular: Negative.   Musculoskeletal: Positive for joint pain.       Left foot pain  Skin: Negative.   Neurological: Negative.   Psychiatric/Behavioral: Positive for depression. The patient is nervous/anxious.     Blood pressure 140/84, pulse 86, height 5\' 4"  (1.626 m), weight 244 lb (110.7 kg).Body mass index is 41.88 kg/m.  General Appearance: Casual  Eye Contact:  Fair  Speech:  Slow  Volume:  Normal  Mood:  Depressed and Dysphoric  Affect:  Constricted and Depressed  Thought Process:  Goal Directed  Orientation:  Full (Time, Place, and Person)  Thought Content: Rumination    Suicidal Thoughts:  No  Homicidal Thoughts:  No  Memory:  Immediate;   Fair Recent;   Fair Remote;   Fair  Judgement:  Fair  Insight:  Good  Psychomotor Activity:  Decreased  Concentration:  Concentration: Fair and Attention Span: Fair  Recall:  Good  Fund of Knowledge: Good  Language: Good  Akathisia:  No  Handed:  Right  AIMS (if indicated):  0  Assets:  Communication Skills Desire for Improvement Housing Resilience Social Support  ADL's:  Intact  Cognition: WNL  Sleep:  fair    Assessment: Major depressive disorder, recurrent.  Plan: Discusses psychosocial stressors.  Patient is taking Wellbutrin SR  450 mg in the morning and Lexapro 20 mg in the evening.  Alice has no tremors or shakes or any serotonin syndrome.  I recommended to try Lamictal to help the mood and depression.  Discuss in detail medication side effects but Alice if Alice had a rash that Alice needed to stop the medication immediately.  We will start Lamictal 25 mg daily for 1 week and then 50 mg daily.  Encouraged to continue counseling with Alice Bowers.  Recommended to call us back if Alice has any question, concern or if Alice feels worsening of the symptom.  Discuss safety plan that anytime having active suicidal thoughts or homicidal thought that should call 911 or go to the local emergency room.  Follow-up in 4 weeks. Viktoriya Glaspy T., MD 12/26/2016, 8:44 AM

## 2017-01-05 ENCOUNTER — Ambulatory Visit (INDEPENDENT_AMBULATORY_CARE_PROVIDER_SITE_OTHER): Payer: Medicare Other | Admitting: Psychology

## 2017-01-05 DIAGNOSIS — F331 Major depressive disorder, recurrent, moderate: Secondary | ICD-10-CM

## 2017-01-05 DIAGNOSIS — Z634 Disappearance and death of family member: Secondary | ICD-10-CM | POA: Diagnosis not present

## 2017-01-05 NOTE — Progress Notes (Signed)
   THERAPIST PROGRESS NOTE  Session Time: 1.33pm-2.23pm  Participation Level: Active  Behavioral Response: Well GroomedAlertDepressed  Type of Therapy: Individual Therapy  Treatment Goals addressed: Diagnosis: MDd and goal 1.  Interventions: CBT and Supportive  Summary: Alice Bowers is a 66 y.o. female who presents with depressed mood and affect.  Pt does report some improvement w/ new medication- a little more energy and pt w/ assistance of church members is getting things on packed at home.  Pt reported that she did talk w/ her son-in law last weekend after several unreturned- went to the home.  Pt reported that he seemed guarded and suggested too busy that day when she offered to pick up granddaughter for sleepover and church next day.  Pt reported she confronted him about what was going on as feeling put off- had she done something wrong.  Pt reported that he didn't like that she upset granddaughter when talking to her couple weeks prior. pt apologized- clarified what happened and they had resolved.  Pt reported he stated "he was done with it".  Pt is concerned that he won't change is mind and will be left out of granddaughters life. Pt discussed ways she can still show support at her races but give space as well for her son- in law.  Pt discussed how connection w/ church that daughter was a member has been positive and feels welcomed there and connected to daughter there.    Suicidal/Homicidal: Nowithout intent/plan  Therapist Response: Assessed pt current functioning per pt report.  Processed w/pt coping depression and bereavement.  Discussed how pt assert herself and was able to have discussion even if not resolved.  Explored w/pt how she can be present but not instigate power struggle either.  REflected positive connection w/ the church and ways she feels connected.   Plan: Return again in 2 weeks.  Diagnosis: MDD    Jan Fireman, [facility] 01/05/2017

## 2017-01-24 ENCOUNTER — Encounter (HOSPITAL_COMMUNITY): Payer: Self-pay | Admitting: Psychiatry

## 2017-01-24 ENCOUNTER — Ambulatory Visit (INDEPENDENT_AMBULATORY_CARE_PROVIDER_SITE_OTHER): Payer: Medicare Other | Admitting: Psychiatry

## 2017-01-24 DIAGNOSIS — F331 Major depressive disorder, recurrent, moderate: Secondary | ICD-10-CM

## 2017-01-24 DIAGNOSIS — Z818 Family history of other mental and behavioral disorders: Secondary | ICD-10-CM | POA: Diagnosis not present

## 2017-01-24 DIAGNOSIS — F17210 Nicotine dependence, cigarettes, uncomplicated: Secondary | ICD-10-CM | POA: Diagnosis not present

## 2017-01-24 MED ORDER — LAMOTRIGINE 25 MG PO TABS: 50.0000 mg | ORAL_TABLET | Freq: Every day | ORAL | 0 refills | Status: DC

## 2017-01-24 NOTE — Progress Notes (Signed)
BH MD/PA/NP OP Progress Note  01/24/2017 2:26 PM Alice Bowers  MRN:  [id number]  Chief Complaint:  Subjective:  I like new medication.  I have more energy.  HPI: Patient came for her follow-up appointment.  She like Lamictal and she is feeling less depressed and less anxious.  She endorse increased energy level and motivation to do things.  Her crying spells are less intense and less frequent.  She has noticed that her coworker also helpful and change in the behavior.  She is sleeping better.  She was able to see her grandson daughter few times in past few weeks.  Though she still sad about son who lives in [place] and does not call as much.  She still missed her daughter who died due to leukemia in 10/01/22.  She started seeing Archie Endo for a regular basis.  Patient denies any agitation, anger, mania, psychosis.  She has no tremors or shakes.  She has no rash or itching.  She wants to continue Lamictal 50 mg along with Lexapro and Wellbutrin.  Patient denies drinking alcohol or using any illegal substances.  Her appetite is okay.  Her vital signs are stable.    Visit Diagnosis:    ICD-10-CM   1. Major depressive disorder, recurrent episode, moderate (HCC) F33.1 lamoTRIgine (LAMICTAL) 25 MG tablet    Past Psychiatric History: Reviewed. Patient endorse history of depression since 1995. At that time she was going through divorce. She has at least 4 psychiatric hospitalization. She has one suicidal attempt by taking overdose on Seroquel. Patient denies any history of paranoia, hallucination or any aggressive behavior. In the past she had tried Paxil, Zoloft, Remeron and Seroquel with limited response. She tried Remeron but cause weight gain.   Past Medical History:  Past Medical History:  Diagnosis Date  . Anxiety   . Arthritis   . Depression   . Heart murmur   . High blood pressure   . High cholesterol   . Kidney stones   . Macular degeneration of left eye   . Obstructive  sleep apnea   . Restless leg syndrome     Past Surgical History:  Procedure Laterality Date  . ABDOMINAL HYSTERECTOMY    . APPENDECTOMY    . arthroscopy left shoulder    . CHOLECYSTECTOMY    . joint fusion bilateral thumbs    . ORIF right ankle    . REPLACEMENT TOTAL KNEE BILATERAL      Family Psychiatric History: Reviewed.  Family History:  Family History  Problem Relation Age of Onset  . Depression Mother   . Depression Brother     Social History:  Social History   Social History  . Marital status: Widowed    Spouse name: N/A  . Number of children: N/A  . Years of education: N/A   Social History Main Topics  . Smoking status: Current Every Day Smoker    Packs/day: 1.00    Types: Cigarettes  . Smokeless tobacco: Never Used  . Alcohol use Yes     Comment: 1-2 drinks per month  . Drug use: No  . Sexual activity: Not Currently   Other Topics Concern  . Not on file   Social History Narrative  . No narrative on file    Allergies:  Allergies  Allergen Reactions  . Azo [Phenazopyridine]   . Cephalosporins   . Demerol [Meperidine]   . Edta [Edetic Acid]   . Macrobid [Nitrofurantoin Macrocrystal]   . Sulfa Antibiotics  Metabolic Disorder Labs: No results found for: HGBA1C, MPG No results found for: PROLACTIN No results found for: CHOL, TRIG, HDL, CHOLHDL, VLDL, LDLCALC   Current Medications: Current Outpatient Prescriptions  Medication Sig Dispense Refill  . albuterol (ACCUNEB) 1.25 MG/3ML nebulizer solution Take 1 ampule by nebulization every 6 (six) hours as needed for wheezing.    . Albuterol Sulfate 108 (90 Base) MCG/ACT AEPB Inhale into the lungs.    Marland Kitchen allopurinol (ZYLOPRIM) 300 MG tablet Take 300 mg by mouth daily.    Marland Kitchen amLODipine (NORVASC) 5 MG tablet Take 5 mg by mouth daily.    . Armodafinil (NUVIGIL) 150 MG tablet Take 150 mg by mouth daily.    Marland Kitchen atorvastatin (LIPITOR) 40 MG tablet Take 40 mg by mouth daily.    Marland Kitchen buPROPion (WELLBUTRIN SR)  150 MG 12 hr tablet Take 2 in am and 1 at bed time 270 tablet 0  . escitalopram (LEXAPRO) 20 MG tablet Take 1 tablet (20 mg total) by mouth daily. 30 tablet 2  . fluticasone furoate-vilanterol (BREO ELLIPTA) 200-25 MCG/INH AEPB Inhale 1 puff into the lungs daily.    . folic acid (FOLVITE) 1 MG tablet Take 1 mg by mouth daily.    Marland Kitchen lamoTRIgine (LAMICTAL) 25 MG tablet Take 1 tab daily for 1 week and than 2 tab daily 60 tablet 0  . pramipexole (MIRAPEX) 0.5 MG tablet Take 0.5 mg by mouth 2 (two) times daily.    . valsartan-hydrochlorothiazide (DIOVAN-HCT) 320-25 MG tablet Take 1 tablet by mouth daily.     No current facility-administered medications for this visit.     Neurologic: Headache: No Seizure: No Paresthesias: No  Musculoskeletal: Strength & Muscle Tone: within normal limits Gait & Station: normal Patient leans: N/A  Psychiatric Specialty Exam: Review of Systems  Constitutional: Negative.   Skin: Negative.  Negative for itching and rash.  Neurological: Negative for dizziness and tremors.    Blood pressure 122/70, pulse 87, height 5\' 6"  (1.676 m), weight 240 lb 6.4 oz (109 kg).Body mass index is 38.8 kg/m.  General Appearance: Casual  Eye Contact:  Good  Speech:  Clear and Coherent  Volume:  Normal  Mood:  Euthymic  Affect:  Improved  Thought Process:  Goal Directed  Orientation:  Full (Time, Place, and Person)  Thought Content: WDL and Logical   Suicidal Thoughts:  No  Homicidal Thoughts:  No  Memory:  Immediate;   Good Recent;   Good Remote;   Good  Judgement:  Good  Insight:  Good  Psychomotor Activity:  Normal  Concentration:  Concentration: Good and Attention Span: Good  Recall:  Good  Fund of Knowledge: Good  Language: Good  Akathisia:  No  Handed:  Right  AIMS (if indicated):  0  Assets:  Communication Skills Desire for [place] Talents/Skills Transportation Vocational/Educational  ADL's:  Intact   Cognition: WNL  Sleep:  Improved    Assessment: Major depressive disorder, recurrent.  Plan: Patient is doing better since added Lamictal.  She has more energy.  She started seeing Tye Maryland for counseling.  Discussed medication side effects and benefits.  Continue Wellbutrin SR 450 mg in the morning and Lexapro 20 mg in the evening.  She will continue Lamictal 50 mg however we will consider increasing the dose if needed on her next appointment.  Reminded about rash that if she had the rash and need to stop the medication immediately.  Recommended to call us back if  she has any question, concern or if she feels worsening of the symptom.  Follow-up in 3 months  Tricia O[place]aca T., MD 01/24/2017, 2:26 PM

## 2017-02-06 ENCOUNTER — Ambulatory Visit (HOSPITAL_COMMUNITY): Payer: Medicare Other | Admitting: Psychology

## 2017-02-06 ENCOUNTER — Encounter (HOSPITAL_COMMUNITY): Payer: Self-pay | Admitting: Psychology

## 2017-02-06 NOTE — Progress Notes (Signed)
Alice Bowers is a 66 y.o. female patient who didn't show for her appointment.  Called to front office 25 minutes after appointment informing got caught up w/ pt at work and just realized missed appointment.          Jan Fireman, LPC

## 2017-02-20 ENCOUNTER — Ambulatory Visit (INDEPENDENT_AMBULATORY_CARE_PROVIDER_SITE_OTHER): Payer: Medicare Other | Admitting: Psychology

## 2017-02-20 DIAGNOSIS — Z634 Disappearance and death of family member: Secondary | ICD-10-CM | POA: Diagnosis not present

## 2017-02-20 DIAGNOSIS — F331 Major depressive disorder, recurrent, moderate: Secondary | ICD-10-CM | POA: Diagnosis not present

## 2017-02-20 NOTE — Progress Notes (Signed)
   THERAPIST PROGRESS NOTE  Session Time: 2.26pm-3.16pm  Participation Level: Active  Behavioral Response: Well GroomedAlertaffect wnl  Type of Therapy: Individual Therapy  Treatment Goals addressed: Diagnosis: MDD and goal 1.  Interventions: CBT and Supportive  Summary: Alice Bowers is a 66 y.o. female who presents with affect bright.  Pt reports she had to call off work today as w/ activity over weekend she wasn't able to walk on her foot this morning. Pt reports that she has been involved w/ going to her granddaughter's races and helping her out and her son in law is talking w/ her again.  Pt reported that she has had more initiative to get things done around the house.  Also her house that was in foreclosure sold at auction today- although will still owe bank- more closure.  Pt discussed positives and challenges financially and w/ medical issues- but focused on not ruminating on things and problem solving what she can.   Suicidal/Homicidal: Nowithout intent/plan  Therapist Response: Assessed pt current functioning per pt report.  Processed w/pt coping and improved interactions w/ son in law and granddaughter.  EXplored w/ pt stressors and how she is approaching.  Plan: Return again in 2 weeks.  Diagnosis: MDD, mild    YATES,LEANNE, LPC 02/20/2017

## 2017-02-22 ENCOUNTER — Ambulatory Visit (HOSPITAL_COMMUNITY): Payer: Medicare Other | Admitting: Psychiatry

## 2017-03-06 ENCOUNTER — Ambulatory Visit (INDEPENDENT_AMBULATORY_CARE_PROVIDER_SITE_OTHER): Payer: Medicare Other | Admitting: Psychology

## 2017-03-06 DIAGNOSIS — F331 Major depressive disorder, recurrent, moderate: Secondary | ICD-10-CM

## 2017-03-06 DIAGNOSIS — Z634 Disappearance and death of family member: Secondary | ICD-10-CM

## 2017-03-06 NOTE — Progress Notes (Signed)
   THERAPIST PROGRESS NOTE  Session Time: 2.30pm-3.17pm  Participation Level: Active  Behavioral Response: Well GroomedAlertaffect bright  Type of Therapy: Individual Therapy  Treatment Goals addressed: Diagnosis: MDD and goal 1.  Interventions: CBT and Supportive  Summary: Alice Bowers is a 66 y.o. female who presents with full and bright affect.  Pt reported tired following work today and was tired Sunday- but that she has been accomplishing more and overall energy improved.  Pt continues to attend her granddaughter's races and enjoying being part of things.  Pt reported she is continuing to get through things in the house slowly and responding to debts.  Pt reports that she feels she is doing better. Pt continues to think of her daughter frequently but not w/ constant tearfulness.  Pt is connecting w/ church support group of parents w/ death of a child. .   Suicidal/Homicidal: Nowithout intent/plan  Therapist Response: Assessed pt current functioning per pt report.  Processed w/pt connectedness w/ granddaughter, church and groups for support.  Validated and normalized grieving process .    Plan: Return again in 2 weeks.  Diagnosis: MDD    Jan Fireman, [facility] 03/06/2017

## 2017-03-20 ENCOUNTER — Ambulatory Visit (INDEPENDENT_AMBULATORY_CARE_PROVIDER_SITE_OTHER): Payer: Medicare Other | Admitting: Psychology

## 2017-03-20 DIAGNOSIS — F331 Major depressive disorder, recurrent, moderate: Secondary | ICD-10-CM

## 2017-03-20 DIAGNOSIS — Z634 Disappearance and death of family member: Secondary | ICD-10-CM | POA: Diagnosis not present

## 2017-03-20 NOTE — Progress Notes (Signed)
   THERAPIST PROGRESS NOTE  Session Time: 2.38pm-3.30pm  Participation Level: Active  Behavioral Response: Well GroomedAlertaffect wnl  Type of Therapy: Individual Therapy  Treatment Goals addressed: diagnosis, MDD and goal 1.  Interventions: CBT  Summary: Harbor Paster is a 66 y.o. female who presents with affect full and generally bright.  pt reported she hasn't worked past 1.5 weeks due to low volume at work. pt reported she has been able to get things done around the house w/ the time.  pt reported that overall mood has been improved and energy remains better.  pt reported some triggers of grief over past couple of weeks and validating/normalized for self.  pt reported positives of being present w/her granddaughter for races and having her over for 2 nights. Pt discussed financial stressors but not ruminating on- doing what    Suicidal/Homicidal: Nowithout intent/plan  Therapist Response: Assessed pt current functioning per pt report.  Processed w/pt coping through grief moments and stressors of finances.  Explored ways of remembering and celebrating.  Plan: Return again in 2 weeks.  Diagnosis: MDD    Jan Fireman, [facility] 03/20/2017

## 2017-04-11 ENCOUNTER — Ambulatory Visit (INDEPENDENT_AMBULATORY_CARE_PROVIDER_SITE_OTHER): Payer: Medicare Other | Admitting: Psychology

## 2017-04-11 DIAGNOSIS — Z634 Disappearance and death of family member: Secondary | ICD-10-CM

## 2017-04-11 DIAGNOSIS — F331 Major depressive disorder, recurrent, moderate: Secondary | ICD-10-CM | POA: Diagnosis not present

## 2017-04-11 NOTE — Progress Notes (Signed)
   THERAPIST PROGRESS NOTE  Session Time: 1.20pm-2.15pm  Participation Level: Active  Behavioral Response: Well GroomedAlertaffect wnl  Type of Therapy: Individual Therapy  Treatment Goals addressed: Diagnosis: MDD and goal 1.  Interventions: CBT and Strength-based  Summary: Alice Bowers is a 66 y.o. female who presents with affect wnl.  Pt reported positive interactions w/ granddaughter and son-in-law.  Pt reported work stressors w/ evaluation and not reflective of her pt care and how hard work- but of one time incidents/complaints of one of physicians.  Pt was able to acknowledge and challenge distortions.  Pt discussed possible job change w/ her role switching to PRN.  Pt reported some progress w/ financial stressor- getting payments reduced and continue progress w/ accomplishing organizing of home.  Pt reports making progress overall.   Suicidal/Homicidal: Nowithout intent/plan  Therapist Response: Assessed pt current functioning per pt report.  Processed w/ pt coping w/ stressors and challenging distortions.  Discussed pt potential job changes and opportunity that may come w/ change.  Reflected pt progress and reminded as process.   Plan: Return again in 2 weeks.  Diagnosis: MDD    Alice Bowers, [facility] 04/11/2017

## 2017-04-24 ENCOUNTER — Ambulatory Visit (INDEPENDENT_AMBULATORY_CARE_PROVIDER_SITE_OTHER): Payer: Medicare Other | Admitting: Psychology

## 2017-04-24 DIAGNOSIS — F331 Major depressive disorder, recurrent, moderate: Secondary | ICD-10-CM

## 2017-04-24 DIAGNOSIS — Z634 Disappearance and death of family member: Secondary | ICD-10-CM

## 2017-04-24 NOTE — Progress Notes (Signed)
   THERAPIST PROGRESS NOTE  Session Time: 2pm-2:30pm  Participation Level: Active  Behavioral Response: Well GroomedAlertaffect bright  Type of Therapy: Individual Therapy  Treatment Goals addressed: Diagnosis: MDD, bereavement and goal 1.  Interventions: CBT and Supportive  Summary: Alice Bowers is a 66 y.o. female who presents with full and bright affect.  Pt apologized for arriving late.  Pt reportedly dozed off at home unexpectedly.  Pt reported that she had a good weekend w/ her granddaughter.  She was able to bring her to one of her racing buddies who lives out of town birthday.  Pt reported that communication w/ her son in law has been better and recognizing he is not an initiator of planning.  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 .   Suicidal/Homicidal: Nowithout intent/plan  Therapist Response: Assessed pt current functioning per pt report.  processed w/pt interactions w/ her granddaughter and son in law.  Assisted in recognizing and reframing past cognitive distortions given interactions.   Plan: Return again in 2 weeks.  Diagnosis: MDD and Grief  Afnan Cadiente, LPC 04/24/2017

## 2017-04-26 ENCOUNTER — Ambulatory Visit (INDEPENDENT_AMBULATORY_CARE_PROVIDER_SITE_OTHER): Payer: Medicare Other | Admitting: Psychiatry

## 2017-04-26 ENCOUNTER — Encounter (HOSPITAL_COMMUNITY): Payer: Self-pay | Admitting: Psychiatry

## 2017-04-26 DIAGNOSIS — Z818 Family history of other mental and behavioral disorders: Secondary | ICD-10-CM

## 2017-04-26 DIAGNOSIS — F331 Major depressive disorder, recurrent, moderate: Secondary | ICD-10-CM | POA: Diagnosis not present

## 2017-04-26 DIAGNOSIS — F17210 Nicotine dependence, cigarettes, uncomplicated: Secondary | ICD-10-CM | POA: Diagnosis not present

## 2017-04-26 MED ORDER — BUPROPION HCL ER (SR) 150 MG PO TB12: ORAL_TABLET | ORAL | 0 refills | Status: DC

## 2017-04-26 MED ORDER — LAMOTRIGINE 25 MG PO TABS: 50.0000 mg | ORAL_TABLET | Freq: Every day | ORAL | 0 refills | Status: DC

## 2017-04-26 MED ORDER — ESCITALOPRAM OXALATE 20 MG PO TABS: 20.0000 mg | ORAL_TABLET | Freq: Every day | ORAL | 2 refills | Status: DC

## 2017-04-26 NOTE — Progress Notes (Signed)
BH MD/PA/NP OP Progress Note  04/26/2017 1:24 PM Alice Bowers  MRN:  [id number]  Chief Complaint: I am doing good.  I like Lamictal. Chief Complaint    Follow-up     HPI: Patient came for her follow-up appointment.  She is taking Lamictal and denies any side effects.  She is less depressed and less anxious.  She also taking Lexapro 20 mg and Wellbutrin 450 mg daily.  She has no rash, itching, tremors or shakes.  She is disappointed because she could not see Legrand Pitts due to her insurance.  Her energy level is good.  She had a good time with her grand daughter last week when she went close to [place] to visit the family member.  Patient denies any mania, psychosis, hallucination.  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  Her appetite is okay.  Her vital signs are stable.  Patient denies drinking alcohol or using any illegal substances. Visit Diagnosis:    ICD-10-CM   1. Major depressive disorder, recurrent episode, moderate (HCC) F33.1 escitalopram (LEXAPRO) 20 MG tablet    buPROPion (WELLBUTRIN SR) 150 MG 12 hr tablet    lamoTRIgine (LAMICTAL) 25 MG tablet    Past Psychiatric History: Reviewed.  Patient endorse history of depression since 1995. At that time she was going through divorce. She has at least 4 psychiatric hospitalization. She has one suicidal attempt by taking overdose on Seroquel. Patient denies any history of paranoia, hallucination or any aggressive behavior. In the past she had tried Paxil, Zoloft, Remeronand Seroquel with limited response. She tried Remeron but cause weight gain.   Past Medical History:  Past Medical History:  Diagnosis Date  . Anxiety   . Arthritis   . Depression   . Heart murmur   . High blood pressure   . High cholesterol   . Kidney stones   . Macular degeneration of left eye   . Obstructive sleep apnea   . Restless leg syndrome     Past Surgical History:  Procedure Laterality Date  . ABDOMINAL HYSTERECTOMY     . APPENDECTOMY    . arthroscopy left shoulder    . CHOLECYSTECTOMY    . joint fusion bilateral thumbs    . ORIF right ankle    . REPLACEMENT TOTAL KNEE BILATERAL      Family Psychiatric Histoy; Reviewed.    Family History:  Family History  Problem Relation Age of Onset  . Depression Mother   . Depression Brother     Social History:  Social History   Social History  . Marital status: Widowed    Spouse name: N/A  . Number of children: N/A  . Years of education: N/A   Social History Main Topics  . Smoking status: Current Every Day Smoker    Packs/day: 1.00    Types: Cigarettes  . Smokeless tobacco: Never Used  . Alcohol use Yes     Comment: 1-2 drinks per month  . Drug use: No  . Sexual activity: Not Currently   Other Topics Concern  . None   Social History Narrative  . None    Allergies:  Allergies  Allergen Reactions  . Azo [Phenazopyridine]   . Cephalosporins   . Demerol [Meperidine]   . Edta [Edetic Acid]   . Macrobid [Nitrofurantoin Macrocrystal]   . Sulfa Antibiotics     Metabolic Disorder Labs: No results found for: HGBA1C, MPG No results found for: PROLACTIN No results found for: CHOL, TRIG, HDL,  CHOLHDL, VLDL, LDLCALC No results found for: TSH  Therapeutic Level Labs: No results found for: LITHIUM No results found for: VALPROATE No components found for:  CBMZ  Current Medications: Current Outpatient Prescriptions  Medication Sig Dispense Refill  . albuterol (ACCUNEB) 1.25 MG/3ML nebulizer solution Take 1 ampule by nebulization every 6 (six) hours as needed for wheezing.    . Albuterol Sulfate 108 (90 Base) MCG/ACT AEPB Inhale into the lungs.    Marland Kitchen allopurinol (ZYLOPRIM) 300 MG tablet Take 300 mg by mouth daily.    Marland Kitchen amLODipine (NORVASC) 5 MG tablet Take 5 mg by mouth daily.    . Armodafinil (NUVIGIL) 150 MG tablet Take 150 mg by mouth daily.    Marland Kitchen atorvastatin (LIPITOR) 40 MG tablet Take 40 mg by mouth daily.    Marland Kitchen buPROPion (WELLBUTRIN  SR) 150 MG 12 hr tablet Take 2 in am and 1 at bed time 270 tablet 0  . escitalopram (LEXAPRO) 20 MG tablet Take 1 tablet (20 mg total) by mouth daily. 30 tablet 2  . fluticasone furoate-vilanterol (BREO ELLIPTA) 200-25 MCG/INH AEPB Inhale 1 puff into the lungs daily.    . folic acid (FOLVITE) 1 MG tablet Take 1 mg by mouth daily.    Marland Kitchen lamoTRIgine (LAMICTAL) 25 MG tablet Take 2 tablets (50 mg total) by mouth daily. 180 tablet 0  . pramipexole (MIRAPEX) 0.5 MG tablet Take 0.5 mg by mouth 2 (two) times daily.    . valsartan-hydrochlorothiazide (DIOVAN-HCT) 320-25 MG tablet Take 1 tablet by mouth daily.     No current facility-administered medications for this visit.      Musculoskeletal: Strength & Muscle Tone: within normal limits Gait & Station: normal Patient leans: N/A  Psychiatric Specialty Exam: Review of Systems  HENT: Negative.   Skin: Negative.  Negative for itching and rash.  Neurological: Negative.     Blood pressure (!) 124/56, pulse 96, height 5\' 6"  (1.676 m), weight 246 lb 12.8 oz (111.9 kg).Body mass index is 39.83 kg/m.  General Appearance: Casual  Eye Contact:  Good  Speech:  Clear and Coherent  Volume:  Normal  Mood:  Euthymic  Affect:  Congruent  Thought Process:  Goal Directed  Orientation:  Full (Time, Place, and Person)  Thought Content: Logical   Suicidal Thoughts:  No  Homicidal Thoughts:  No  Memory:  Immediate;   Good Recent;   Good Remote;   Good  Judgement:  Good  Insight:  Good  Psychomotor Activity:  Normal  Concentration:  Concentration: Good and Attention Span: Good  Recall:  Good  Fund of Knowledge: Good  Language: Good  Akathisia:  No  Handed:  Right  AIMS (if indicated): not done  Assets:  Communication Skills Desire for Improvement Housing Resilience  ADL's:  Intact  Cognition: WNL  Sleep:  Good   Screenings:   Assessment and Plan: Mdd, recurrent   Patient doing better on her current meds. We will schedule her to a new  therapist who accepts her insurance. Continue Lamicatl 50 mg daily, wellbutrin 450 mg daily and lexapro 20 mg daily. Discussed side effects of meds. Rec to call us if needed. Follow up in 3 months    Merryl Buckels T., MD 04/26/2017, 1:24 PM

## 2017-05-08 ENCOUNTER — Ambulatory Visit (HOSPITAL_COMMUNITY): Payer: Self-pay | Admitting: Psychology

## 2017-05-08 ENCOUNTER — Encounter (HOSPITAL_COMMUNITY): Payer: Self-pay | Admitting: Licensed Clinical Social Worker

## 2017-05-08 ENCOUNTER — Ambulatory Visit (INDEPENDENT_AMBULATORY_CARE_PROVIDER_SITE_OTHER): Payer: Medicare Other | Admitting: Licensed Clinical Social Worker

## 2017-05-08 DIAGNOSIS — F331 Major depressive disorder, recurrent, moderate: Secondary | ICD-10-CM

## 2017-05-08 DIAGNOSIS — Z634 Disappearance and death of family member: Secondary | ICD-10-CM | POA: Diagnosis not present

## 2017-05-08 NOTE — Progress Notes (Signed)
Comprehensive Clinical Assessment (CCA) Note  05/08/2017 Alice Bowers [id number]  Visit Diagnosis:      ICD-10-CM   1. Major depressive disorder, recurrent episode, moderate (HCC) F33.1   2. Bereavement Z63.4       CCA Part One  Part One has been completed on paper by the patient.  (See scanned document in Chart Review)  CCA Part Two A  Intake/Chief Complaint:  CCA Intake With Chief Complaint CCA Part Two Date: 05/08/17 CCA Part Two Time: 1333-11-22 Chief Complaint/Presenting Problem: Major depressive disorder since 11/22/93. August 26, 2023- daughter died. Still having difficulty dealing with this loss Patients Currently Reported Symptoms/Problems: "I don't want to do anything, I just want to sit". Lamictdal has helped somewhat.  Collateral Involvement: Dr. Adele Schilder, Son-in-law and granddaughter live here. Son lives in [place].  Individual's Strengths: I'm 26 and still have my brain, still working. Individual's Preferences: support for grieving and services for depression Individual's Abilities: nurse, crochet, swedish weave, sewing, photography Type of Services Patient Feels Are Needed: counseling and medication management  Mental Health Symptoms Depression:  Depression: Change in energy/activity, Fatigue, Hopelessness, Increase/decrease in appetite, Tearfulness (Lamictdal has helped with energy level)  Mania:  Mania: N/A  Anxiety:   Anxiety: Fatigue, Irritability, Worrying  Psychosis:  Psychosis: N/A  Trauma:  Trauma: N/A  Obsessions:  Obsessions: N/A  Compulsions:  Compulsions: N/A  Inattention:  Inattention: N/A  Hyperactivity/Impulsivity:  Hyperactivity/Impulsivity: N/A  Oppositional/Defiant Behaviors:  Oppositional/Defiant Behaviors: N/A  Borderline Personality:  Emotional Irregularity: N/A  Other Mood/Personality Symptoms:   NA   Mental Status Exam Appearance and self-care  Stature:  Stature: Average  Weight:  Weight: Overweight  Clothing:  Clothing: Neat/clean   Grooming:  Grooming: Well-groomed  Cosmetic use:  Cosmetic Use: Age appropriate  Posture/gait:  Posture/Gait: Normal  Motor activity:  Motor Activity: Not Remarkable  Sensorium  Attention:  Attention: Normal  Concentration:  Concentration: Normal  Orientation:  Orientation: X5  Recall/memory:  Recall/Memory: Normal  Affect and Mood  Affect:  Affect: Depressed  Mood:  Mood: Angry  Relating  Eye contact:  Eye Contact: Normal  Facial expression:  Facial Expression: Depressed  Attitude toward examiner:  Attitude Toward Examiner: Cooperative  Thought and Language  Speech flow: Speech Flow: Normal  Thought content:  Thought Content: Appropriate to mood and circumstances  Preoccupation:   NA  Hallucinations:   NA  Organization:   NA  Transport planner of Knowledge:  Fund of Knowledge: Average  Intelligence:  Intelligence: Average  Abstraction:  Abstraction: Normal  Judgement:  Judgement: Normal  Reality Testing:  Reality Testing: Realistic  Insight:  Insight: Good  Decision Making:  Decision Making: Normal  Social Functioning  Social Maturity:  Social Maturity: Isolates  Social Judgement:  Social Judgement: Normal  Stress  Stressors:  Stressors: Brewing technologist, Psychologist, forensic Ability:  Coping Ability: Deficient supports, English as a second language teacher Deficits:   NA  Supports:   NA   Family and Psychosocial History: Family history Marital status: Widowed Widowed, when?: 11-23-2010- 2nd husband died from lung cancer 1 week after dx Are you sexually active?: No What is your sexual orientation?: heterosexual Has your sexual activity been affected by drugs, alcohol, medication, or emotional stress?: no Does patient have children?: Yes How many children?: 2 How is patient's relationship with their children?: Pt daughter, Claiborne Billings, died 60/73/71 from complications of leukemia- she was 65y/o.  Pt reported that a year after her dx her daughter distanced herself but over past 9 months positive  relationship  and interactions.  Pt has a son Hilliard Clark age 34y/o who moved to [place], [place] 9 years ago.  pt has 1 65 y/o granddaughter and 4 stepgranddaughters who are all adults.   Childhood History:  Childhood History By whom was/is the patient raised?: Both parents Additional childhood history information: Pt was born and grew up in [place], [place] Description of patient's relationship with caregiver when they were a child: good with dad, mother was never a loving person. mother found fault with everything.  Patient's description of current relationship with people who raised him/her: parents are deceased How were you disciplined when you got in trouble as a child/adolescent?: spanking with a belt Does patient have siblings?: Yes Number of Siblings: 3 Description of patient's current relationship with siblings: Pt has a 5 y/o sister, 65 y/o sister, susan who lives in [place] and pt reports she is a support, pt has a 61 y/o brother.  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[facility] [employer] How long has patient been employed?: 10 months Patient's job has been impacted by current illness: Yes Describe how patient's job has been impacted: feels paranoid about doing her job, gets negative attention from the doctor What is the longest time patient has a held a job?: 16 years Where was the patient employed at that time?: [employer] in [place] patient ever been in  the [employer]?: No Has patient ever served in combat?: No Did You Receive Any Psychiatric Treatment/Services While in Passenger transport manager?: No Are There Guns or Other Weapons in [place]?: Yes Types of Guns/Weapons: gun Are These Weapons Safely Secured?: No Who Could Verify You Are Able To Have These Secured:: lives by herself. Gun for her protection  Education: Education Did Teacher, adult education From [employer]?: Yes Did Physicist, medical?: Yes What Type of College Degree Do you Have?: RN What Was Your Major?: nursing Did You Have An Individualized Education Program (IIEP): No Did You Have Any Difficulty At School?: No  Religion: Religion/Spirituality Are You A Religious Person?: Yes What is Your Religious Affiliation?: Baptist How Might This Affect Treatment?: "it shouldn't"  Leisure/Recreation: Leisure / Recreation Leisure and Hobbies: crocheting, sewing, play games on her phone, watching tv  Exercise/Diet: Exercise/Diet Do You Exercise?: No Have You Gained or Lost A Significant Amount of Weight in the Past Six Months?: No Do You Follow a Special Diet?: No Do You Have Any Trouble Sleeping?: Yes Explanation of Sleeping Difficulties: difficulty falling asleep, waking frequent- just since the loss of her daughter.   CCA Part Two C  Alcohol/Drug Use: Alcohol / Drug Use Pain Medications: see chart Prescriptions: taking as prescribed History of alcohol / drug use?: No history of alcohol / drug abuse                      CCA Part Three  ASAM's:  Six Dimensions of Multidimensional Assessment  Dimension 1:  Acute Intoxication and/or Withdrawal Potential:     Dimension 2:  Biomedical Conditions  and Complications:     Dimension 3:  Emotional, Behavioral, or Cognitive Conditions and Complications:     Dimension 4:  Readiness to Change:     Dimension 5:  Relapse, Continued use, or Continued Problem Potential:     Dimension 6:  Recovery/Living Environment:      Substance use  Disorder (SUD)    Social Function:  Social Functioning Social Maturity: Isolates Social Judgement: Normal  Stress:  Stress Stressors: Grief/losses, Barista Ability: Deficient supports, Overwhelmed Patient Takes Medications The Way The Doctor Instructed?: Yes Priority Risk: Low Acuity  Risk Assessment- Self-Harm Potential: Risk Assessment For Self-Harm Potential Thoughts of Self-Harm: No current thoughts Method: No plan Availability of Means: No access/NA Additional Information for Self-Harm Potential: Previous Attempts Additional Comments for Self-Harm Potential: Pt was inpt for tx following overdose in November 1995. Wasn't trying to kill herself. Very overwhelmed by family stressors.   Risk Assessment -Dangerous to Others Potential: Risk Assessment For Dangerous to Others Potential Method: No Plan Availability of Means: No access or NA Intent: Vague intent or NA Notification Required: No need or identified person  DSM5 Diagnoses: There are no active problems to display for this patient.   Patient Centered Plan: Patient is on the following Treatment Plan(s):  Depression  Recommendations for Services/Supports/Treatments: Recommendations for Services/Supports/Treatments Recommendations For Services/Supports/Treatments: Medication Management, Individual Therapy  Treatment Plan Summary:    Referrals to Alternative Service(s): Referred to Alternative Service(s):   Place:   Date:   Time:    Referred to Alternative Service(s):   Place:   Date:   Time:    Referred to Alternative Service(s):   Place:   Date:   Time:    Referred to Alternative Service(s):   Place:   Date:   Time:     [name], LCSW

## 2017-05-08 NOTE — Progress Notes (Signed)
   THERAPIST PROGRESS NOTE  Session Time: 1:30pm-2:30pm  Participation Level: Active  Behavioral Response: NeatConfusedDepressed  Type of Therapy: Individual Therapy   Treatment Goals addressed: Diagnosis: MDD, bereavement    Interventions: CBT and Supportive   Summary: Alice Bowers is a 66 y.o. female who presents with MDD and Bereavement    Suicidal/Homicidal: Nowithout intent/plan   Therapist Response: Assessed pt current functioning per pt report.  processed w/pt interactions w/ her granddaughter and son in law.  Assisted in recognizing and reframing past cognitive distortions given interactions. Updated CCA. Discussed treatment options and identified coping skills.    Plan: Return again in 2 weeks.   Diagnosis:      MDD and Bereavement  [name] 05/08/2017

## 2017-05-22 ENCOUNTER — Ambulatory Visit (INDEPENDENT_AMBULATORY_CARE_PROVIDER_SITE_OTHER): Payer: Medicare Other | Admitting: Licensed Clinical Social Worker

## 2017-05-22 ENCOUNTER — Encounter (HOSPITAL_COMMUNITY): Payer: Self-pay | Admitting: Licensed Clinical Social Worker

## 2017-05-22 DIAGNOSIS — Z634 Disappearance and death of family member: Secondary | ICD-10-CM | POA: Diagnosis not present

## 2017-05-22 DIAGNOSIS — F331 Major depressive disorder, recurrent, moderate: Secondary | ICD-10-CM | POA: Diagnosis not present

## 2017-05-22 NOTE — Progress Notes (Signed)
   THERAPIST PROGRESS NOTE  Session Time: 1:30pm-2:30pm  Participation Level: Active  Behavioral Response: NeatLethargicDepressed  Type of Therapy: Individual Therapy   Treatment Goals addressed: Diagnosis: MDD, bereavement   Interventions: CBT and Supportive   Summary: Alice Bowers is a 65 y.o. female who presents with MDD and Bereavement    Suicidal/Homicidal: Nowithout intent/plan   Therapist Response: Assessed pt current functioning per pt report.  processed w/pt interactions w/ her granddaughter and son in law.  Assisted in recognizing and reframing past cognitive distortions given interactions. Discussed current work situation and identified need to make a change. Explored pt's interests and options for job hunting. Encouraged pt to do what she loves in order to make her experience better. Also explored ways to improve self care.    Plan: Return again in 2 weeks.   Diagnosis:      MDD and Grief   [name], LCSW 05/22/2017

## 2017-05-30 ENCOUNTER — Ambulatory Visit (HOSPITAL_COMMUNITY): Payer: Self-pay | Admitting: Psychology

## 2017-06-05 ENCOUNTER — Ambulatory Visit (HOSPITAL_COMMUNITY): Payer: Self-pay | Admitting: Licensed Clinical Social Worker

## 2017-06-12 ENCOUNTER — Ambulatory Visit (HOSPITAL_COMMUNITY): Payer: Self-pay | Admitting: Licensed Clinical Social Worker

## 2017-06-13 ENCOUNTER — Ambulatory Visit (HOSPITAL_COMMUNITY): Payer: Self-pay | Admitting: Psychology

## 2017-06-26 ENCOUNTER — Ambulatory Visit (INDEPENDENT_AMBULATORY_CARE_PROVIDER_SITE_OTHER): Payer: Medicare Other | Admitting: Licensed Clinical Social Worker

## 2017-06-26 ENCOUNTER — Encounter (HOSPITAL_COMMUNITY): Payer: Self-pay | Admitting: Licensed Clinical Social Worker

## 2017-06-26 DIAGNOSIS — F331 Major depressive disorder, recurrent, moderate: Secondary | ICD-10-CM | POA: Diagnosis not present

## 2017-06-26 DIAGNOSIS — F4321 Adjustment disorder with depressed mood: Secondary | ICD-10-CM | POA: Diagnosis not present

## 2017-06-26 NOTE — Progress Notes (Signed)
   THERAPIST PROGRESS NOTE  Session Time: 1:30pm-2:30pm  Participation Level: Active  Behavioral Response: Well GroomedAlertEuthymic  Type of Therapy: Individual Therapy   Treatment Goals addressed: Diagnosis: MDD, bereavement and goal 1.   Interventions: CBT and Supportive   Summary: Alice Bowers is a 66 y.o. female who presents with MDD and Bereavement    Suicidal/Homicidal: Nowithout intent/plan   Therapist Response: Assessed pt current functioning per pt report.  processed w/pt interactions w/ her granddaughter and son in law.  Assisted in recognizing and reframing past cognitive distortions given interactions. Discussed upcoming holidays and pt's concerns about spending the holidays on her own. Explored options for asking to be included with son-in-law and granddaughter's family. Reminded pt that the assumption is that she has plans, which will continue to be the case unless she speaks up. Clinician discussed ways to commemorate daughter's memory, as her Onnie Boer date is coming up after Christmas. Pt discussed the death of daughter and her previous interests and involvement with first responders. Clinician offered options for baking cookies for first responders or giving back in some other way. Pt. Reports mood is improved also because she has changed jobs.    Plan: Return again in 2 weeks.   Diagnosis:      MDD and Grief  [name], LCSW 06/26/2017

## 2017-07-10 ENCOUNTER — Ambulatory Visit (INDEPENDENT_AMBULATORY_CARE_PROVIDER_SITE_OTHER): Payer: Medicare Other | Admitting: Licensed Clinical Social Worker

## 2017-07-10 ENCOUNTER — Encounter (HOSPITAL_COMMUNITY): Payer: Self-pay | Admitting: Licensed Clinical Social Worker

## 2017-07-10 DIAGNOSIS — F4321 Adjustment disorder with depressed mood: Secondary | ICD-10-CM

## 2017-07-10 DIAGNOSIS — F329 Major depressive disorder, single episode, unspecified: Secondary | ICD-10-CM | POA: Diagnosis not present

## 2017-07-10 DIAGNOSIS — F330 Major depressive disorder, recurrent, mild: Secondary | ICD-10-CM

## 2017-07-10 NOTE — Progress Notes (Signed)
   THERAPIST PROGRESS NOTE  Session Time: 1:30pm-2:30pm   Participation Level: Active  Behavioral Response: Well GroomedAlertEuthymic  Type of Therapy: Individual Therapy   Treatment Goals addressed: Diagnosis: MDD, bereavement and goal 1.   Interventions: CBT and Supportive   Summary: Alice Bowers is a 66 y.o. female who presents with MDD and Bereavement    Suicidal/Homicidal: Nowithout intent/plan   Therapist Response: Assessed pt current functioning per pt report.  processed w/pt interactions w/ her granddaughter and son in law.  Assisted in recognizing and reframing past cognitive distortions given interactions. Discussed interactions with in-laws over the holiday and noted increased positive interactions and happiness due to feelings more included in the family. Explored interactions and adjustment to new work situation and noted improvement in mood, processing, and overall sense of ease.    Plan: Return again in 2 weeks.   Diagnosis:      MDD and Grief  Alice Curling, LCSW 07/10/2017

## 2017-07-24 ENCOUNTER — Ambulatory Visit (HOSPITAL_COMMUNITY): Payer: Self-pay | Admitting: Licensed Clinical Social Worker

## 2017-07-26 ENCOUNTER — Encounter (HOSPITAL_COMMUNITY): Payer: Self-pay | Admitting: Psychiatry

## 2017-07-26 ENCOUNTER — Ambulatory Visit (INDEPENDENT_AMBULATORY_CARE_PROVIDER_SITE_OTHER): Payer: Medicare Other | Admitting: Psychiatry

## 2017-07-26 DIAGNOSIS — F331 Major depressive disorder, recurrent, moderate: Secondary | ICD-10-CM | POA: Diagnosis not present

## 2017-07-26 DIAGNOSIS — Z818 Family history of other mental and behavioral disorders: Secondary | ICD-10-CM | POA: Diagnosis not present

## 2017-07-26 DIAGNOSIS — F17210 Nicotine dependence, cigarettes, uncomplicated: Secondary | ICD-10-CM | POA: Diagnosis not present

## 2017-07-26 MED ORDER — BUPROPION HCL ER (SR) 150 MG PO TB12: ORAL_TABLET | ORAL | 0 refills | Status: DC

## 2017-07-26 MED ORDER — LAMOTRIGINE 25 MG PO TABS: 50.0000 mg | ORAL_TABLET | Freq: Every day | ORAL | 0 refills | Status: DC

## 2017-07-26 MED ORDER — ESCITALOPRAM OXALATE 20 MG PO TABS: 20.0000 mg | ORAL_TABLET | Freq: Every day | ORAL | 0 refills | Status: DC

## 2017-07-26 NOTE — Progress Notes (Signed)
[place] MD/PA/NP OP Progress Note  07/26/2017 1:19 PM Alice Bowers  MRN:  [id number]  Chief Complaint: I am doing better.  I quit working at [employer] because I did not get along with the physician.  HPI: Patient came for her follow-up appointment.  She is compliant with medication and denies any side effects.  She quit working at [employer] because she did not get along with the physician.  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  She really like the job.  She is busy 3-4 days a week.  She is happy she is seeing [name] for CBT.  She had a good Thanksgiving.  However this will be her first Thanksgiving since her daughter died.  She is hoping to spend time with her granddaughter.  She is sleeping good.  She denies any irritability, anger, mania or any psychosis.  She denies drinking alcohol or using any illegal substances.  She has no tremors shakes or any EPS.  She is taking Lexapro 20 mg daily, Wellbutrin 450 mg daily and Lamictal 50 mg daily.  She has no rash, itching, tremors or shakes.  Her energy level is good.  Her appetite is okay.  Visit Diagnosis:    ICD-10-CM   1. Major depressive disorder, recurrent episode, moderate (HCC) F33.1 escitalopram (LEXAPRO) 20 MG tablet    buPROPion (WELLBUTRIN SR) 150 MG 12 hr tablet    lamoTRIgine (LAMICTAL) 25 MG tablet    Past Psychiatric History: Reviewed. Patient has history of depression since 1995. At that time she was going through divorce. She has at least 4 psychiatric hospitalization. She has one suicidal attempt by taking overdose on Seroquel. Patient denies any history of paranoia, hallucination or any aggressive behavior. In the past she had tried Paxil, Zoloft, Remeronand Seroquel with limited response. She tried Remeron but cause weight gain.   Past Medical History:  Past Medical History:  Diagnosis Date  . Anxiety   . Arthritis   . Depression   . Heart murmur   . High  blood pressure   . High cholesterol   . Kidney stones   . Macular degeneration of left eye   . Obstructive sleep apnea   . Restless leg syndrome     Past Surgical History:  Procedure Laterality Date  . ABDOMINAL HYSTERECTOMY    . APPENDECTOMY    . arthroscopy left shoulder    . CHOLECYSTECTOMY    . joint fusion bilateral thumbs    . ORIF right ankle    . REPLACEMENT TOTAL KNEE BILATERAL      Family Psychiatric History: Reviewed  Family History:  Family History  Problem Relation Age of Onset  . Depression Mother   . Depression Brother     Social History:  Social History   Socioeconomic History  . Marital status: Widowed    Spouse name: Not on file  . Number of children: Not on file  . Years of education: Not on file  . Highest education level: Not on file  Social Needs  . Financial resource strain: Not on file  . Food insecurity - worry: Not on file  . Food insecurity - inability: Not on file  . Transportation needs - medical: Not on file  . Transportation needs - non-medical: Not on file  Occupational History  . Not on file  Tobacco Use  . Smoking status: Current Every Day Smoker    Packs/day: 1.00    Types: Cigarettes  .  Smokeless tobacco: Never Used  Substance and Sexual Activity  . Alcohol use: Yes    Comment: 1-2 drinks per month  . Drug use: No  . Sexual activity: Not Currently  Other Topics Concern  . Not on file  Social History Narrative  . Not on file    Allergies:  Allergies  Allergen Reactions  . Azo [Phenazopyridine]   . Cephalosporins   . Demerol [Meperidine]   . Edta [Edetic Acid]   . Macrobid [Nitrofurantoin Macrocrystal]   . Sulfa Antibiotics     Metabolic Disorder Labs: No results found for: HGBA1C, MPG No results found for: PROLACTIN No results found for: CHOL, TRIG, HDL, CHOLHDL, VLDL, LDLCALC No results found for: TSH  Therapeutic Level Labs: No results found for: LITHIUM No results found for: VALPROATE No components  found for:  CBMZ  Current Medications: Current Outpatient Medications  Medication Sig Dispense Refill  . albuterol (ACCUNEB) 1.25 MG/3ML nebulizer solution Take 1 ampule by nebulization every 6 (six) hours as needed for wheezing.    . Albuterol Sulfate 108 (90 Base) MCG/ACT AEPB Inhale into the lungs.    Marland Kitchen allopurinol (ZYLOPRIM) 300 MG tablet Take 300 mg by mouth daily.    Marland Kitchen amLODipine (NORVASC) 5 MG tablet Take 5 mg by mouth daily.    . Armodafinil (NUVIGIL) 150 MG tablet Take 150 mg by mouth daily.    Marland Kitchen atorvastatin (LIPITOR) 40 MG tablet Take 40 mg by mouth daily.    Marland Kitchen buPROPion (WELLBUTRIN SR) 150 MG 12 hr tablet Take 2 in am and 1 at bed time 270 tablet 0  . escitalopram (LEXAPRO) 20 MG tablet Take 1 tablet (20 mg total) by mouth daily. 30 tablet 2  . fluticasone furoate-vilanterol (BREO ELLIPTA) 200-25 MCG/INH AEPB Inhale 1 puff into the lungs daily.    . folic acid (FOLVITE) 1 MG tablet Take 1 mg by mouth daily.    Marland Kitchen lamoTRIgine (LAMICTAL) 25 MG tablet Take 2 tablets (50 mg total) by mouth daily. 180 tablet 0  . pramipexole (MIRAPEX) 0.5 MG tablet Take 0.5 mg by mouth 2 (two) times daily.    . valsartan-hydrochlorothiazide (DIOVAN-HCT) 320-25 MG tablet Take 1 tablet by mouth daily.     No current facility-administered medications for this visit.      Musculoskeletal: Strength & Muscle Tone: within normal limits Gait & Station: normal Patient leans: N/A  Psychiatric Specialty Exam: ROS  Blood pressure 128/76, pulse 86, height 5' 3.5" (1.613 m), weight 246 lb (111.6 kg), SpO2 97 %.There is no height or weight on file to calculate BMI.  General Appearance: Casual  Eye Contact:  Good  Speech:  Clear and Coherent  Volume:  Normal  Mood:  Euthymic  Affect:  Appropriate  Thought Process:  Goal Directed  Orientation:  Full (Time, Place, and Person)  Thought Content: Logical   Suicidal Thoughts:  No  Homicidal Thoughts:  No  Memory:  Immediate;   Good Recent;   Good Remote;    Good  Judgement:  Good  Insight:  Good  Psychomotor Activity:  Normal  Concentration:  Concentration: Good and Attention Span: Good  Recall:  Good  Fund of Knowledge: Good  Language: Good  Akathisia:  No  Handed:  Right  AIMS (if indicated): not done  Assets:  Communication Skills Desire for Improvement Housing  ADL's:  Intact  Cognition: WNL  Sleep:  Good   Screenings:   Assessment and Plan: Major depressive disorder, recurrent.  Patient doing much better  on her current medication.  She is seeing [name] for CBT.  Continue Lamictal 50 mg daily, Wellbutrin 450 mg daily and Lexapro 20 mg daily.  Discussed medication side effects and benefits.  Recommended to call us back if she has any question or any concern.  Follow-up in 3 months.  Discussed healthy lifestyle.  Encouraged to watch her calorie intake and do regular exercise.   Kathlee Nations, MD 07/26/2017, 1:19 PM

## 2017-08-01 ENCOUNTER — Other Ambulatory Visit (HOSPITAL_COMMUNITY): Payer: Self-pay | Admitting: Psychiatry

## 2017-08-01 DIAGNOSIS — F331 Major depressive disorder, recurrent, moderate: Secondary | ICD-10-CM

## 2017-08-14 ENCOUNTER — Encounter (HOSPITAL_COMMUNITY): Payer: Self-pay | Admitting: Psychiatry

## 2017-08-14 ENCOUNTER — Encounter (HOSPITAL_COMMUNITY): Payer: Self-pay | Admitting: Licensed Clinical Social Worker

## 2017-08-14 ENCOUNTER — Ambulatory Visit (INDEPENDENT_AMBULATORY_CARE_PROVIDER_SITE_OTHER): Payer: Medicare Other | Admitting: Licensed Clinical Social Worker

## 2017-08-14 DIAGNOSIS — F4321 Adjustment disorder with depressed mood: Secondary | ICD-10-CM | POA: Diagnosis not present

## 2017-08-14 DIAGNOSIS — F330 Major depressive disorder, recurrent, mild: Secondary | ICD-10-CM

## 2017-08-14 NOTE — Progress Notes (Signed)
   THERAPIST PROGRESS NOTE  Session Time: 1:30pm-2:30pm  Participation Level: Active  Behavioral Response: Well GroomedAlertEuthymic   Type of Therapy: Individual Therapy   Treatment Goals addressed: Diagnosis: MDD, bereavement   Interventions: CBT and Supportive   Summary: Alice Bowers is a 65 y.o. female who presents with MDD and Bereavement    Suicidal/Homicidal: Nowithout intent/plan   Therapist Response: Assessed pt current functioning per pt report.  processed w/pt interactions w/ her granddaughter and son in law.  Assisted in recognizing and reframing past cognitive distortions given interactions. Discussed grief process through Christmas and the anniversary of her daughter's death. Provided supportive therapy re: grief process and noted that there is no rule book about how to process losses. Identified the importance of being prepared for difficult anniversaries and allowing the feelings to come, as well as tears, as needed.  Alice Bowers reported she increased her Lamictdal for a few days in order to survive the holidays and anniversary date. She reported that was very helpful, but has since reduced it to previous dosage.    Plan: Return again in 2 weeks.   Diagnosis:      MDD and Grief  [name], LCSW 08/14/2017

## 2017-08-15 HISTORY — PX: MASTECTOMY: SHX3

## 2017-09-21 ENCOUNTER — Ambulatory Visit (HOSPITAL_COMMUNITY): Payer: Self-pay | Admitting: Licensed Clinical Social Worker

## 2017-10-18 ENCOUNTER — Other Ambulatory Visit (HOSPITAL_COMMUNITY): Payer: Self-pay | Admitting: Psychiatry

## 2017-10-18 DIAGNOSIS — F331 Major depressive disorder, recurrent, moderate: Secondary | ICD-10-CM

## 2017-10-19 ENCOUNTER — Encounter (HOSPITAL_COMMUNITY): Payer: Self-pay | Admitting: Licensed Clinical Social Worker

## 2017-10-19 ENCOUNTER — Ambulatory Visit (INDEPENDENT_AMBULATORY_CARE_PROVIDER_SITE_OTHER): Payer: Medicare Other | Admitting: Licensed Clinical Social Worker

## 2017-10-19 DIAGNOSIS — F331 Major depressive disorder, recurrent, moderate: Secondary | ICD-10-CM

## 2017-10-19 DIAGNOSIS — F4321 Adjustment disorder with depressed mood: Secondary | ICD-10-CM | POA: Diagnosis not present

## 2017-10-19 NOTE — Progress Notes (Signed)
   THERAPIST PROGRESS NOTE  Session Time: 10:10am-11:00am  Participation Level: Active  Behavioral Response: NeatAlertDepressed  Type of Therapy: Individual Therapy   Treatment Goals addressed: Diagnosis: MDD, bereavement and goal 1.   Interventions: CBT and Supportive   Summary: Alice Bowers is a 67 y.o. female who presents with MDD and Bereavement    Suicidal/Homicidal: Nowithout intent/plan   Therapist Response: Assessed pt current functioning per pt report.  processed w/pt interactions w/ her granddaughter and son in law.  Assisted in recognizing and reframing past cognitive distortions given interactions. Clinician identified increased depression following increased thoughts about daughter. Clinician processed the events of daughter's death and noted the likelihood of daughter knowing it was her time to go and not being afraid. [name] reports she continues to feel so stressed about money and not being able to pay for prescriptions. Clinician explored options for discounts, samples, and going directly to the pharmaceutical companies.   Plan: Return again in 2 weeks.   Diagnosis:      MDD and Grief  [name], LCSW  10/19/2017

## 2017-10-24 ENCOUNTER — Ambulatory Visit (INDEPENDENT_AMBULATORY_CARE_PROVIDER_SITE_OTHER): Payer: Medicare Other | Admitting: Psychiatry

## 2017-10-24 ENCOUNTER — Encounter (HOSPITAL_COMMUNITY): Payer: Self-pay | Admitting: Psychiatry

## 2017-10-24 DIAGNOSIS — F17210 Nicotine dependence, cigarettes, uncomplicated: Secondary | ICD-10-CM

## 2017-10-24 DIAGNOSIS — Z818 Family history of other mental and behavioral disorders: Secondary | ICD-10-CM

## 2017-10-24 DIAGNOSIS — F331 Major depressive disorder, recurrent, moderate: Secondary | ICD-10-CM

## 2017-10-24 MED ORDER — LAMOTRIGINE 25 MG PO TABS: 50.0000 mg | ORAL_TABLET | Freq: Every day | ORAL | 0 refills | Status: DC

## 2017-10-24 MED ORDER — ESCITALOPRAM OXALATE 20 MG PO TABS: 20.0000 mg | ORAL_TABLET | Freq: Every day | ORAL | 0 refills | Status: DC

## 2017-10-24 MED ORDER — BUPROPION HCL ER (SR) 150 MG PO TB12: ORAL_TABLET | ORAL | 0 refills | Status: DC

## 2017-10-24 NOTE — Progress Notes (Signed)
[facility] MD/PA/NP OP Progress Note  10/24/2017 8:54 AM Alice Bowers  MRN:  [id number]  Chief Complaint: I am doing good.  I am taking the medication.  HPI: Patient came for her follow-up appointment.  She is compliant with medication and reported no side effects.  She is working 2 part-time job.  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  She recently seen her primary care physician and she had blood work.  Her hemoglobin A1c is 5.8.  Her LFT and CBC was normal.  She admitted weight gain because she has been not walking and doing exercise.  She has tendinitis in left leg and wearing boot.  She is hoping once boot come off then she can walk or exercise.  Patient denies any irritability, anger, mania, psychosis.  She denies any crying spells or any feeling of hopelessness or worthlessness.  She has no tremors, shakes or any EPS.  She feels medicine working very well.  She has no rash or itching.  She is taking Lexapro 20 mg daily and Wellbutrin 450 mg daily Lamictal 50 mg daily.  Patient denies drinking alcohol or using any illegal substances.  Her appetite is okay.  Her energy level is good.  Visit Diagnosis:    ICD-10-CM   1. Major depressive disorder, recurrent episode, moderate (HCC) F33.1 buPROPion (WELLBUTRIN SR) 150 MG 12 hr tablet    lamoTRIgine (LAMICTAL) 25 MG tablet    escitalopram (LEXAPRO) 20 MG tablet    Past Psychiatric History: Reviewed. Patient has history of depression since 1995. At that time she was going through divorce. She has at least 4 psychiatric hospitalization. She has one suicidal attempt by taking overdose on Seroquel. Patient denies any history of paranoia, hallucination or any aggressive behavior. In the past she had tried Paxil, Zoloft, Remeronand Seroquel with limited response. She tried Remeron but cause weight gain.   Past Medical History:  Past Medical History:  Diagnosis Date  . Anxiety   . Arthritis   .  Depression   . Heart murmur   . High blood pressure   . High cholesterol   . Kidney stones   . Macular degeneration of left eye   . Obstructive sleep apnea   . Restless leg syndrome     Past Surgical History:  Procedure Laterality Date  . ABDOMINAL HYSTERECTOMY    . APPENDECTOMY    . arthroscopy left shoulder    . CHOLECYSTECTOMY    . joint fusion bilateral thumbs    . ORIF right ankle    . REPLACEMENT TOTAL KNEE BILATERAL      Family Psychiatric History: Reviewed.  Family History:  Family History  Problem Relation Age of Onset  . Depression Mother   . Depression Brother     Social History:  Social History   Socioeconomic History  . Marital status: Widowed    Spouse name: None  . Number of children: None  . Years of education: None  . Highest education level: None  Social Needs  . Financial resource strain: None  . Food insecurity - worry: None  . Food insecurity - inability: None  . Transportation needs - medical: None  . Transportation needs - non-medical: None  Occupational History  . None  Tobacco Use  . Smoking status: Current Some Day Smoker    Packs/day: 1.00    Years: 20.00    Pack years: 20.00    Types: Cigarettes  . Smokeless tobacco:  Never Used  Substance and Sexual Activity  . Alcohol use: Yes    Comment: 1-2 drinks per month  . Drug use: No  . Sexual activity: Not Currently  Other Topics Concern  . None  Social History Narrative  . None    Allergies:  Allergies  Allergen Reactions  . Azo [Phenazopyridine] Hives  . Pyridium [Phenazopyridine Hcl] Hives  . Requip [Ropinirole Hcl] Swelling  . Betadine [Povidone Iodine] Other (See Comments)    Burn rash  . Cephalosporins   . Demerol [Meperidine]   . Edta [Edetic Acid]   . Macrobid [Nitrofurantoin Macrocrystal]   . Sulfa Antibiotics   . Ceclor [Cefaclor] Rash  . Dynacirc [Isradipine] Rash  . Keflex [Cephalexin] Rash    Metabolic Disorder Labs: No results found for: HGBA1C,  MPG No results found for: PROLACTIN No results found for: CHOL, TRIG, HDL, CHOLHDL, VLDL, LDLCALC No results found for: TSH  Therapeutic Level Labs: No results found for: LITHIUM No results found for: VALPROATE No components found for:  CBMZ  Current Medications: Current Outpatient Medications  Medication Sig Dispense Refill  . albuterol (ACCUNEB) 1.25 MG/3ML nebulizer solution Take 1 ampule by nebulization every 6 (six) hours as needed for wheezing.    . Albuterol Sulfate 108 (90 Base) MCG/ACT AEPB Inhale into the lungs.    Marland Kitchen allopurinol (ZYLOPRIM) 300 MG tablet Take 300 mg by mouth daily.    Marland Kitchen amLODipine (NORVASC) 5 MG tablet Take 5 mg by mouth daily.    Marland Kitchen atorvastatin (LIPITOR) 40 MG tablet Take 40 mg by mouth daily.    Marland Kitchen buPROPion (WELLBUTRIN SR) 150 MG 12 hr tablet Take 2 in am and 1 at bed time 270 tablet 0  . escitalopram (LEXAPRO) 20 MG tablet Take 1 tablet (20 mg total) by mouth daily. 90 tablet 0  . fluticasone furoate-vilanterol (BREO ELLIPTA) 200-25 MCG/INH AEPB Inhale 1 puff into the lungs daily.    . folic acid (FOLVITE) 1 MG tablet Take 1 mg by mouth daily.    Marland Kitchen lamoTRIgine (LAMICTAL) 25 MG tablet Take 2 tablets (50 mg total) by mouth daily. 180 tablet 0  . pramipexole (MIRAPEX) 0.5 MG tablet Take 0.5 mg by mouth 2 (two) times daily.    . valsartan-hydrochlorothiazide (DIOVAN-HCT) 320-25 MG tablet Take 1 tablet by mouth daily.    . Armodafinil (NUVIGIL) 150 MG tablet Take 150 mg by mouth daily.     No current facility-administered medications for this visit.      Musculoskeletal: Strength & Muscle Tone: within normal limits Gait & Station: unsteady, boot on left leg Patient leans: Left  Psychiatric Specialty Exam: ROS  Blood pressure 132/80, pulse 97, height 5\' 6"  (1.676 m), weight 250 lb (113.4 kg).Body mass index is 40.35 kg/m.  General Appearance: Casual  Eye Contact:  Good  Speech:  Clear and Coherent  Volume:  Normal  Mood:  Euthymic  Affect:   Appropriate  Thought Process:  Goal Directed  Orientation:  Full (Time, Place, and Person)  Thought Content: Logical   Suicidal Thoughts:  No  Homicidal Thoughts:  No  Memory:  Immediate;   Good Recent;   Good Remote;   Good  Judgement:  Good  Insight:  Good  Psychomotor Activity:  Normal  Concentration:  Concentration: Good and Attention Span: Good  Recall:  Good  Fund of Knowledge: Good  Language: Good  Akathisia:  No  Handed:  Right  AIMS (if indicated): not done  Assets:  Communication Skills Desire for  Improvement Housing Resilience Social Support  ADL's:  Intact  Cognition: WNL  Sleep:  Good   Screenings:   Assessment and Plan: Major depressive disorder, recurrent.  Patient is a stable on her current psychiatric medication.  I reviewed blood work results from the primary care physician.  Her hemoglobin A1c is 5.8.  Encourage weight loss and healthy lifestyle.  Continue Lamictal 50 mg daily, Wellbutrin XL 450 mg daily and Lexapro 20 mg daily.  Patient does not have any tremors, shakes or any serotonin syndrome.  Encouraged to continue counseling with [name].  Recommended to call us back if she has any question, concern if he feels worsening of the symptoms.  Follow-up in 3 months.   Kathlee Nations, MD 10/24/2017, 8:55 AM

## 2017-11-16 ENCOUNTER — Ambulatory Visit (INDEPENDENT_AMBULATORY_CARE_PROVIDER_SITE_OTHER): Payer: Medicare Other | Admitting: Licensed Clinical Social Worker

## 2017-11-16 ENCOUNTER — Encounter (HOSPITAL_COMMUNITY): Payer: Self-pay | Admitting: Licensed Clinical Social Worker

## 2017-11-16 DIAGNOSIS — F331 Major depressive disorder, recurrent, moderate: Secondary | ICD-10-CM

## 2017-11-16 DIAGNOSIS — Z634 Disappearance and death of family member: Secondary | ICD-10-CM

## 2017-11-16 NOTE — Progress Notes (Signed)
   THERAPIST PROGRESS NOTE  Session Time: 10:00am-11:00am  Participation Level: Active  Behavioral Response: Well GroomedAlertDepressed  Type of Therapy: Individual Therapy   Treatment Goals addressed: Diagnosis: MDD, bereavement and goal 1.   Interventions: CBT, MI and Supportive   Summary: Alice Bowers is a 67 y.o. female who presents with MDD and Bereavement    Suicidal/Homicidal: Nowithout intent/plan   Therapist Response: Assessed pt current functioning per pt report.  processed w/pt interactions w/ her granddaughter and son in law.  Assisted in recognizing and reframing past cognitive distortions given interactions. Pt discussed current stress regarding finances. Clinician processed ways to either cut down on expenses or change her work environment in order to make more money. Clinician discussed pt's concerns about granddaughter being more reserved and not wanting to spend as much time with her. Clinician utilized MI OARS to reflect and summarize thoughts and feelings. Clinician encouraged pt to maintain engagement and communication with granddaughter and son-in-law to maintain closeness and relationship.    Plan: Return again in 4 weeks.   Diagnosis:      MDD and Grief  [name], LCSW 11/16/2017

## 2017-11-25 ENCOUNTER — Other Ambulatory Visit (HOSPITAL_COMMUNITY): Payer: Self-pay | Admitting: Psychiatry

## 2017-11-25 DIAGNOSIS — F331 Major depressive disorder, recurrent, moderate: Secondary | ICD-10-CM

## 2017-12-14 ENCOUNTER — Ambulatory Visit (INDEPENDENT_AMBULATORY_CARE_PROVIDER_SITE_OTHER): Payer: Medicare Other | Admitting: Licensed Clinical Social Worker

## 2017-12-14 ENCOUNTER — Encounter (HOSPITAL_COMMUNITY): Payer: Self-pay | Admitting: Licensed Clinical Social Worker

## 2017-12-14 ENCOUNTER — Other Ambulatory Visit (HOSPITAL_COMMUNITY): Payer: Self-pay | Admitting: Psychiatry

## 2017-12-14 ENCOUNTER — Telehealth (HOSPITAL_COMMUNITY): Payer: Self-pay

## 2017-12-14 DIAGNOSIS — F331 Major depressive disorder, recurrent, moderate: Secondary | ICD-10-CM | POA: Diagnosis not present

## 2017-12-14 DIAGNOSIS — Z634 Disappearance and death of family member: Secondary | ICD-10-CM

## 2017-12-14 NOTE — Telephone Encounter (Signed)
Patient is asking if she can increase the Lamictal, she follows up with you in June

## 2017-12-14 NOTE — Progress Notes (Signed)
   THERAPIST PROGRESS NOTE  Session Time: 10:00am-11:00am  Participation Level: Active  Behavioral Response: CasualAlertDepressed  Type of Therapy: Individual Therapy   Treatment Goals addressed: Diagnosis: MDD, bereavement and goal 1.   Interventions: CBT, MI, and Supportive   Summary: Alice Bowers is a 67 y.o. female who presents with MDD and Bereavement    Suicidal/Homicidal: Nowithout intent/plan   Therapist Response: Assessed pt current functioning per pt report.  processed w/pt interactions w/ her granddaughter and son in law.  Used MI when processing client's feelings about not being able to afford her maintenance medications or pay her bills due to financial hardship.  Assisted patient with processing feelings related to her son-in-law not being on board with grief therapy for her granddaughter.  Provided referral information for a therapist in [place], [place] for granddaughter and son in Sports coach. Offered postive reinforcement and encouraged patient to continue to be social at work.  Spoke to the nurse about possibly increasing Lamictal.    Plan: Return again in 2 weeks.   Diagnosis:      MDD and Grief  [name], LCSW 12/14/2017

## 2017-12-14 NOTE — Telephone Encounter (Signed)
She can take 3 Lamictal to 75 mg daily.  Watch carefully for the rash.  We can provide 30-day supply.  Patient has appointment in June.

## 2017-12-15 MED ORDER — LAMOTRIGINE 25 MG PO TABS: 75.0000 mg | ORAL_TABLET | Freq: Every day | ORAL | 0 refills | Status: DC

## 2017-12-20 ENCOUNTER — Other Ambulatory Visit (HOSPITAL_COMMUNITY): Payer: Self-pay

## 2017-12-20 DIAGNOSIS — F331 Major depressive disorder, recurrent, moderate: Secondary | ICD-10-CM

## 2017-12-20 MED ORDER — LAMOTRIGINE 25 MG PO TABS: 75.0000 mg | ORAL_TABLET | Freq: Every day | ORAL | 0 refills | Status: DC

## 2018-01-16 ENCOUNTER — Ambulatory Visit (INDEPENDENT_AMBULATORY_CARE_PROVIDER_SITE_OTHER): Payer: Medicare Other | Admitting: Licensed Clinical Social Worker

## 2018-01-16 ENCOUNTER — Encounter (HOSPITAL_COMMUNITY): Payer: Self-pay | Admitting: Licensed Clinical Social Worker

## 2018-01-16 DIAGNOSIS — Z634 Disappearance and death of family member: Secondary | ICD-10-CM | POA: Diagnosis not present

## 2018-01-16 DIAGNOSIS — F329 Major depressive disorder, single episode, unspecified: Secondary | ICD-10-CM | POA: Diagnosis not present

## 2018-01-16 DIAGNOSIS — F331 Major depressive disorder, recurrent, moderate: Secondary | ICD-10-CM

## 2018-01-16 NOTE — Progress Notes (Signed)
   THERAPIST PROGRESS NOTE  Session Time: 9:15am-10:00am  Participation Level: Active  Behavioral Response: Well GroomedAlertDepressed  Type of Therapy: Individual Therapy   Treatment Goals addressed: Diagnosis: MDD, bereavement and goal 1.   Interventions: CBT and Supportive   Summary: Alice Bowers is a 67 y.o. female who presents with MDD and Bereavement    Suicidal/Homicidal: Nowithout intent/plan   Therapist Response: Assessed pt current functioning per pt report.  processed w/pt interactions w/ her granddaughter and son in law.  Assisted in recognizing and reframing past cognitive distortions given interactions. Clinician identified ongoing depression and hurt feelings about granddaughter not wanting to spend as much time with her. Clinician provided psychoeducation about the impact of grief and how it can be presented by children and adults. Clinician discussed ways to self-soothe and encouraged Alice Bowers to increase her social network. 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 Clinician also discussed the value of exercise in treatment of depression and encouraged Alice Bowers to research getting into [employer].    Plan: Return again in 2 weeks.   Diagnosis:      MDD and Grief  [name], LCSW 01/16/2018

## 2018-01-18 ENCOUNTER — Other Ambulatory Visit (HOSPITAL_COMMUNITY): Payer: Self-pay | Admitting: Psychiatry

## 2018-01-18 DIAGNOSIS — F331 Major depressive disorder, recurrent, moderate: Secondary | ICD-10-CM

## 2018-01-24 ENCOUNTER — Encounter (HOSPITAL_COMMUNITY): Payer: Self-pay | Admitting: Psychiatry

## 2018-01-24 ENCOUNTER — Ambulatory Visit (INDEPENDENT_AMBULATORY_CARE_PROVIDER_SITE_OTHER): Payer: Medicare Other | Admitting: Psychiatry

## 2018-01-24 DIAGNOSIS — F331 Major depressive disorder, recurrent, moderate: Secondary | ICD-10-CM

## 2018-01-24 MED ORDER — ESCITALOPRAM OXALATE 20 MG PO TABS: 20.0000 mg | ORAL_TABLET | Freq: Every day | ORAL | 2 refills | Status: DC

## 2018-01-24 MED ORDER — BUPROPION HCL ER (SR) 150 MG PO TB12: ORAL_TABLET | ORAL | 2 refills | Status: DC

## 2018-01-24 MED ORDER — LAMOTRIGINE 100 MG PO TABS: 100.0000 mg | ORAL_TABLET | Freq: Every day | ORAL | 2 refills | Status: DC

## 2018-01-24 NOTE — Progress Notes (Signed)
[facility] MD/PA/NP OP Progress Note  01/24/2018 8:45 AM Alice Bowers  MRN:  [id number]  Chief Complaint: I am very sad because my 66-year-old daughter refused to talk to me.  HPI: Alice Bowers came for her follow-up appointment.  Now she is taking Lamictal 75 mg along with Wellbutrin and Lexapro.  She is sad because her 51-year-old granddaughter refused to talk to her.  Patient told she accuses her that all the time she talks about her deceased daughter.  Patient endorsed having crying spells and feeling guilty and sad.  She is also not happy with her current job which is part-time.  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  She is seeing Alice Bowers for therapy.  She admitted lately crying spells, anxiety but denies any suicidal thoughts or homicidal thought.  She continues to have left leg pain and she is taking some time pain medication.  She has no tremors, shakes or any EPS.  She has no rash or itching.  Her appetite is okay.  Her energy level is good.  Patient denies drinking or using any illegal substances.  Visit Diagnosis:    ICD-10-CM   1. Major depressive disorder, recurrent episode, moderate (HCC) F33.1 escitalopram (LEXAPRO) 20 MG tablet    buPROPion (WELLBUTRIN SR) 150 MG 12 hr tablet    lamoTRIgine (LAMICTAL) 100 MG tablet    Past Psychiatric History: Reviewed Patienthashistory of depression since 1995. At that time she was going through divorce. She has at least 4 psychiatric hospitalization. She has one suicidal attempt by taking overdose on Seroquel. Patient denies any history of paranoia, hallucination or any aggressive behavior. In the past she had tried Paxil, Zoloft, Remeronand Seroquel with limited response. She tried Remeron but cause weight gain.   Past Medical History:  Past Medical History:  Diagnosis Date  . Anxiety   . Arthritis   . Depression   . Heart murmur   . High blood pressure   . High cholesterol   . Kidney stones   . Macular  degeneration of left eye   . Obstructive sleep apnea   . Restless leg syndrome     Past Surgical History:  Procedure Laterality Date  . ABDOMINAL HYSTERECTOMY    . APPENDECTOMY    . arthroscopy left shoulder    . CHOLECYSTECTOMY    . joint fusion bilateral thumbs    . ORIF right ankle    . REPLACEMENT TOTAL KNEE BILATERAL      Family Psychiatric History: Viewed.  Family History:  Family History  Problem Relation Age of Onset  . Depression Mother   . Depression Brother     Social History:  Social History   Socioeconomic History  . Marital status: Widowed    Spouse name: Not on file  . Number of children: Not on file  . Years of education: Not on file  . Highest education level: Not on file  Occupational History  . Not on file  Social Needs  . Financial resource strain: Not on file  . Food insecurity:    Worry: Not on file    Inability: Not on file  . Transportation needs:    Medical: Not on file    Non-medical: Not on file  Tobacco Use  . Smoking status: Current Some Day Smoker    Packs/day: 1.00    Years: 20.00    Pack years: 20.00    Types: Cigarettes  . Smokeless tobacco: Never Used  Substance and Sexual Activity  .  Alcohol use: Yes    Comment: 1-2 drinks per month  . Drug use: No  . Sexual activity: Not Currently  Lifestyle  . Physical activity:    Days per week: Not on file    Minutes per session: Not on file  . Stress: Not on file  Relationships  . Social connections:    Talks on phone: Not on file    Gets together: Not on file    Attends religious service: Not on file    Active member of club or organization: Not on file    Attends meetings of clubs or organizations: Not on file    Relationship status: Not on file  Other Topics Concern  . Not on file  Social History Narrative  . Not on file    Allergies:  Allergies  Allergen Reactions  . Azo [Phenazopyridine] Hives  . Pyridium [Phenazopyridine Hcl] Hives  . Requip [Ropinirole Hcl]  Swelling  . Betadine [Povidone Iodine] Other (See Comments)    Burn rash  . Cephalosporins   . Demerol [Meperidine]   . Edta [Edetic Acid]   . Macrobid [Nitrofurantoin Macrocrystal]   . Sulfa Antibiotics   . Ceclor [Cefaclor] Rash  . Dynacirc [Isradipine] Rash  . Keflex [Cephalexin] Rash    Metabolic Disorder Labs: No results found for: HGBA1C, MPG No results found for: PROLACTIN No results found for: CHOL, TRIG, HDL, CHOLHDL, VLDL, LDLCALC No results found for: TSH  Therapeutic Level Labs: No results found for: LITHIUM No results found for: VALPROATE No components found for:  CBMZ  Current Medications: Current Outpatient Medications  Medication Sig Dispense Refill  . albuterol (ACCUNEB) 1.25 MG/3ML nebulizer solution Take 1 ampule by nebulization every 6 (six) hours as needed for wheezing.    . Albuterol Sulfate 108 (90 Base) MCG/ACT AEPB Inhale into the lungs.    Marland Kitchen allopurinol (ZYLOPRIM) 300 MG tablet Take 300 mg by mouth daily.    Marland Kitchen amLODipine (NORVASC) 5 MG tablet Take 5 mg by mouth daily.    Marland Kitchen atorvastatin (LIPITOR) 40 MG tablet Take 40 mg by mouth daily.    Marland Kitchen buPROPion (WELLBUTRIN SR) 150 MG 12 hr tablet Take 2 in am and 1 at bed time 270 tablet 0  . escitalopram (LEXAPRO) 20 MG tablet Take 1 tablet (20 mg total) by mouth daily. 90 tablet 0  . fluticasone furoate-vilanterol (BREO ELLIPTA) 200-25 MCG/INH AEPB Inhale 1 puff into the lungs daily.    . folic acid (FOLVITE) 1 MG tablet Take 1 mg by mouth daily.    . hydrochlorothiazide (HYDRODIURIL) 25 MG tablet Take 25 mg by mouth daily.    Marland Kitchen lamoTRIgine (LAMICTAL) 25 MG tablet Take 3 tablets (75 mg total) by mouth daily. 90 tablet 0  . losartan (COZAAR) 100 MG tablet Take 100 mg by mouth daily.    . pramipexole (MIRAPEX) 0.5 MG tablet Take 0.5 mg by mouth 2 (two) times daily.    . Armodafinil (NUVIGIL) 150 MG tablet Take 150 mg by mouth daily.    . valsartan-hydrochlorothiazide (DIOVAN-HCT) 320-25 MG tablet Take 1 tablet  by mouth daily.     No current facility-administered medications for this visit.      Musculoskeletal: Strength & Muscle Tone: within normal limits Gait & Station: normal Patient leans: N/A  Psychiatric Specialty Exam: ROS  Blood pressure 97/62, pulse 86, height 5\' 6"  (1.676 m), weight 245 lb (111.1 kg), SpO2 98 %.Body mass index is 39.54 kg/m.  General Appearance: Casual  Eye Contact:  Good  Speech:  Clear and Coherent  Volume:  Normal  Mood:  Dysphoric  Affect:  Congruent  Thought Process:  Goal Directed  Orientation:  Full (Time, Place, and Person)  Thought Content: Rumination   Suicidal Thoughts:  No  Homicidal Thoughts:  No  Memory:  Immediate;   Good Recent;   Good Remote;   Good  Judgement:  Good  Insight:  Good  Psychomotor Activity:  Normal  Concentration:  Concentration: Fair and Attention Span: Fair  Recall:  Good  Fund of Knowledge: Good  Language: Good  Akathisia:  No  Handed:  Left  AIMS (if indicated): not done  Assets:  Communication Skills Desire for Improvement Housing Talents/Skills  ADL's:  Intact  Cognition: WNL  Sleep:  Fair   Screenings:   Assessment and Plan: Major depressive disorder, recurrent.  Anxiety disorder NOS.  Reassurance given.  Encouraged to write letters to her granddaughter.  Increase Lamictal 100 mg daily, continue Wellbutrin SR 450 mg daily and Lexapro 20 mg daily.  Patient has no shakes, tremors or serotonin syndrome.  Encouraged to keep appointment with Alice Bowers.  Recommended to call us back if she has any question, concern if she feels worsening of the symptoms.  Follow-up in 3 months.   Kathlee Nations, MD 01/24/2018, 8:45 AM

## 2018-02-14 ENCOUNTER — Encounter (HOSPITAL_COMMUNITY): Payer: Self-pay | Admitting: Licensed Clinical Social Worker

## 2018-02-14 ENCOUNTER — Ambulatory Visit (INDEPENDENT_AMBULATORY_CARE_PROVIDER_SITE_OTHER): Payer: Medicare Other | Admitting: Licensed Clinical Social Worker

## 2018-02-14 DIAGNOSIS — F331 Major depressive disorder, recurrent, moderate: Secondary | ICD-10-CM

## 2018-02-14 DIAGNOSIS — F4321 Adjustment disorder with depressed mood: Secondary | ICD-10-CM | POA: Diagnosis not present

## 2018-02-14 NOTE — Progress Notes (Signed)
   THERAPIST PROGRESS NOTE  Session Time: 11:00am-12:00pm  Participation Level: Active  Behavioral Response: Well GroomedAlertDepressed  Type of Therapy: Individual Therapy   Treatment Goals addressed: Diagnosis: MDD, bereavement and goal 1.   Interventions: CBT and Supportive   Summary: Alice Bowers is a 67 y.o. female who presents with MDD and Bereavement    Suicidal/Homicidal: Nowithout intent/plan   Therapist Response: Assessed pt current functioning per pt report.  processed w/pt interactions w/ her granddaughter and son in law.  Assisted in recognizing and reframing past cognitive distortions given interactions. Pt discussed ongoing concerns about her relationship with granddaughter. She reports granddaughter does not want her to go to races or to have sleepovers at this time. Clinician explored thoughts and feelings and encouraged her to maintain her involvement with granddaughter, but to go at a slower pace. Pt continues to experience high levels of depression and grief. 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 Clinician explored opportunities available and discussed pt's experience and job options.    Plan: Return again in 2 weeks.   Diagnosis:      MDD and Grief  [name], LCSW 02/14/2018

## 2018-03-15 ENCOUNTER — Ambulatory Visit (HOSPITAL_COMMUNITY): Payer: Medicare Other | Admitting: Licensed Clinical Social Worker

## 2018-04-17 ENCOUNTER — Ambulatory Visit (INDEPENDENT_AMBULATORY_CARE_PROVIDER_SITE_OTHER): Payer: Medicare Other | Admitting: Licensed Clinical Social Worker

## 2018-04-17 ENCOUNTER — Encounter (HOSPITAL_COMMUNITY): Payer: Self-pay | Admitting: Licensed Clinical Social Worker

## 2018-04-17 DIAGNOSIS — Z634 Disappearance and death of family member: Secondary | ICD-10-CM | POA: Diagnosis not present

## 2018-04-17 DIAGNOSIS — F331 Major depressive disorder, recurrent, moderate: Secondary | ICD-10-CM | POA: Diagnosis not present

## 2018-04-17 NOTE — Progress Notes (Signed)
   THERAPIST PROGRESS NOTE  Session Time: 10:00am-11:00am  Participation Level: Active  Behavioral Response: Well GroomedAlertDepressed  Type of Therapy: Individual Therapy   Treatment Goals addressed: Diagnosis: MDD, bereavement and goal 1.   Interventions: CBT and Supportive   Summary: Alice Bowers is a 67 y.o. female who presents with MDD and Bereavement    Suicidal/Homicidal: Nowithout intent/plan   Therapist Response: Assessed pt current functioning per pt report.  processed w/pt interactions w/ her granddaughter and son in law.  Assisted in recognizing and reframing past cognitive distortions given interactions. Pt continues to express depression due to loss of daughter and current lack of contact and communication with granddaughter. Clinician processed recent interactions and explored assistants within the family to increase contact. Clinician provided feedback about expectations of granddaughter and encouraged her to continue being present in granddaughter's life. Pt reports she is working full-time now and feels a little more financially secure, once she can get bills up to date.    Plan: Return again in 4 weeks.   Diagnosis:      MDD and Grief  [name], LCSW 04/17/2018

## 2018-04-30 ENCOUNTER — Ambulatory Visit (HOSPITAL_COMMUNITY): Payer: Self-pay | Admitting: Psychiatry

## 2018-05-17 ENCOUNTER — Encounter

## 2018-05-17 ENCOUNTER — Ambulatory Visit (HOSPITAL_COMMUNITY): Payer: Self-pay | Admitting: Licensed Clinical Social Worker

## 2018-05-26 ENCOUNTER — Other Ambulatory Visit (HOSPITAL_COMMUNITY): Payer: Self-pay | Admitting: Psychiatry

## 2018-05-26 DIAGNOSIS — F331 Major depressive disorder, recurrent, moderate: Secondary | ICD-10-CM

## 2018-05-31 ENCOUNTER — Encounter (HOSPITAL_COMMUNITY): Payer: Self-pay | Admitting: Licensed Clinical Social Worker

## 2018-05-31 ENCOUNTER — Ambulatory Visit (INDEPENDENT_AMBULATORY_CARE_PROVIDER_SITE_OTHER): Payer: Medicare Other | Admitting: Licensed Clinical Social Worker

## 2018-05-31 DIAGNOSIS — F4321 Adjustment disorder with depressed mood: Secondary | ICD-10-CM | POA: Diagnosis not present

## 2018-05-31 DIAGNOSIS — F331 Major depressive disorder, recurrent, moderate: Secondary | ICD-10-CM | POA: Diagnosis not present

## 2018-05-31 NOTE — Progress Notes (Signed)
   THERAPIST PROGRESS NOTE  Session Time: 11:00am-12:00pm  Participation Level: Active  Behavioral Response: Well GroomedAlertDepressed   Type of Therapy: Individual Therapy   Treatment Goals addressed: Diagnosis: MDD, bereavement   Interventions: CBT and Supportive   Summary: Lydiana Milley is a 67 y.o. female who presents with MDD and Bereavement    Suicidal/Homicidal: Nowithout intent/plan   Therapist Response: [name] met with clinician for an individual session. Sakiya discussed her psychiatric symptoms, her current life events and her goals for therapy. Shahira shared that she was diagnosed with Breast Cancer earlier this month and has recently had a mastectomy. Syvanna reports she has not told her granddaughter and son-in-law, but she has let her step-granddaughter know. She has also discussed this with her son, nieces, and neighbors. Clinician processed emotional status and identified the expected and current thoughts and feelings. Clinician utilized supportive therapy in order to process the reality of this dx, as well as the feelings about treatment. Clinician identified the importance of connections with others, referred to groups at the hospital.  [name] discussed increased anxiety since learning of this dx and asked if Dr. Adele Schilder would re-prescribe Ativan, which she took in 2015. Clinician will discuss this with Dr. Adele Schilder.   Plan: Return again in 1-2 weeks.   Diagnosis:      MDD and Grief  [name], LCSW 05/31/2018

## 2018-06-08 ENCOUNTER — Telehealth (HOSPITAL_COMMUNITY): Payer: Self-pay

## 2018-06-08 ENCOUNTER — Other Ambulatory Visit (HOSPITAL_COMMUNITY): Payer: Self-pay | Admitting: Psychiatry

## 2018-06-08 MED ORDER — LORAZEPAM 0.5 MG PO TABS: 0.5000 mg | ORAL_TABLET | Freq: Every day | ORAL | 0 refills | Status: DC | PRN

## 2018-06-08 NOTE — Telephone Encounter (Signed)
Patient comes in next week to see you, however she was just diagnosed with breast cancer and wants to know if if you can give her something for nerves. Patient used to take Ativan and wants to know if you can fill it for her

## 2018-06-08 NOTE — Telephone Encounter (Signed)
Please call Ativan 0.5 mg # 10 pills to take as needed for severe anxiety.  We will discuss more on her appointment.

## 2018-06-12 ENCOUNTER — Ambulatory Visit (HOSPITAL_COMMUNITY): Payer: Self-pay | Admitting: Psychiatry

## 2018-06-13 ENCOUNTER — Ambulatory Visit (HOSPITAL_COMMUNITY): Payer: Medicare Other | Admitting: Licensed Clinical Social Worker

## 2018-06-21 ENCOUNTER — Encounter (HOSPITAL_COMMUNITY): Payer: Self-pay | Admitting: Licensed Clinical Social Worker

## 2018-06-21 ENCOUNTER — Ambulatory Visit (INDEPENDENT_AMBULATORY_CARE_PROVIDER_SITE_OTHER): Payer: Medicare Other | Admitting: Licensed Clinical Social Worker

## 2018-06-21 DIAGNOSIS — F331 Major depressive disorder, recurrent, moderate: Secondary | ICD-10-CM | POA: Diagnosis not present

## 2018-06-21 NOTE — Progress Notes (Signed)
   THERAPIST PROGRESS NOTE  Session Time: 8:05am-9:00am  Participation Level: Active  Behavioral Response: Well GroomedAlertDepressed  Type of Therapy: Individual Therapy   Treatment Goals addressed: Diagnosis: MDD, bereavement   Interventions: CBT and Supportive   Summary: Daleigh Pollinger is a 67 y.o. female who presents with MDD and Bereavement    Suicidal/Homicidal: Nowithout intent/plan   Therapist Response: [name] met with clinician for an individual session. Shanyce discussed her psychiatric symptoms, her current life events and her goals for therapy. Nakiesha shared that she has not been able to have her chemotherapy started due to continuing to drain lymphatic fluid following her mastectomy. Clinician explored the surgeon's concerns and noted that she will not be released to start her chemo until she is draining less than 30 cc's, but at this time she is still draining more than 70 cc's daily. Clinician discussed feelings about going through these treatments and noted resilience and preparedness to go ahead and get through it. Clinician explored sadness and fear, but also identified the motivation for completion. Diasha reports she is working and continues to [employer] through each day, even though she is very tired. Venicia reported concerns about scheduling appointments due to them possibly interfering with chemo. Clinician encouraged Marimar to prioritize her chemo and to utilized counseling services provided at the cancer center as well.  Jerrianne reports ongoing problems with granddaughter not wanting to see her. She reports her daughter's friend has been manipulating granddaughter and discouraging her from reaching out to Dexter and her sister. Clinician utilized MI OARS to reflect and summarize thoughts and feelings. Clinician also identified the importance of being present and keeping up with son-in-law.    Plan: Return again in 1-2 weeks.   Diagnosis:      MDD and Grief  [name], LCSW 06/21/2018

## 2018-06-27 ENCOUNTER — Ambulatory Visit (HOSPITAL_COMMUNITY): Payer: Medicare Other | Admitting: Licensed Clinical Social Worker

## 2018-06-27 ENCOUNTER — Other Ambulatory Visit (HOSPITAL_COMMUNITY): Payer: Self-pay | Admitting: Psychiatry

## 2018-06-27 DIAGNOSIS — F331 Major depressive disorder, recurrent, moderate: Secondary | ICD-10-CM

## 2018-07-03 ENCOUNTER — Ambulatory Visit (INDEPENDENT_AMBULATORY_CARE_PROVIDER_SITE_OTHER): Payer: Medicare Other | Admitting: Licensed Clinical Social Worker

## 2018-07-03 ENCOUNTER — Encounter (HOSPITAL_COMMUNITY): Payer: Self-pay | Admitting: Licensed Clinical Social Worker

## 2018-07-03 DIAGNOSIS — F4321 Adjustment disorder with depressed mood: Secondary | ICD-10-CM | POA: Diagnosis not present

## 2018-07-03 DIAGNOSIS — F331 Major depressive disorder, recurrent, moderate: Secondary | ICD-10-CM | POA: Diagnosis not present

## 2018-07-03 NOTE — Progress Notes (Signed)
   THERAPIST PROGRESS NOTE  Session Time: 8:00am-9:00am  Participation Level: Active  Behavioral Response: Well GroomedAlertDepressed  Type of Therapy: Individual Therapy   Treatment Goals addressed: Diagnosis: MDD, bereavement   Interventions: CBT and Supportive   Summary: Bobie Caris is a 67 y.o. female who presents with MDD and Bereavement    Suicidal/Homicidal: Nowithout intent/plan   Therapist Response: [name] met with clinician for an individual session. Shanette discussed her psychiatric symptoms, her current life events and her goals for therapy. Teresina shared that she started her chemotherapy last week for the first time. She reports she feels very tired and it has been hard for her to work. However, due to her financial concerns, she has no choice. Ethelyne identified that her colleagues and supervisors have been very supportive and have allowed her to take time off as needed. However, she has concerns that the corporate management may cause problems for her. Clinician explored Karisa's coping ability and her connections with her family and friends. Clinician identified the importance of remaining as active and social as possible in order to reduce feelings of isolation and loneliness. Clinician also discussed the value of communicating with her son-in-law, so he can possibly get granddaughter to be closer again. Quintara reports there has been no change in that arena and noted feeling so isolated and lonely when she's stuck at home by herself. Darcy reports motivation to beat the cancer and she is doing what she can do to stay healthy and positive.    Plan: Return again in 1-2 weeks.   Diagnosis:      MDD and Grief  [name], LCSW 07/03/2018

## 2018-07-10 ENCOUNTER — Ambulatory Visit (HOSPITAL_COMMUNITY): Payer: Medicare Other | Admitting: Licensed Clinical Social Worker

## 2018-07-18 ENCOUNTER — Ambulatory Visit (INDEPENDENT_AMBULATORY_CARE_PROVIDER_SITE_OTHER): Payer: BLUE CROSS/BLUE SHIELD | Admitting: Psychiatry

## 2018-07-18 ENCOUNTER — Encounter (HOSPITAL_COMMUNITY): Payer: Self-pay | Admitting: Psychiatry

## 2018-07-18 ENCOUNTER — Encounter

## 2018-07-18 VITALS — BP 144/77 | HR 93 | Ht 66.0 in | Wt 254.0 lb

## 2018-07-18 DIAGNOSIS — F419 Anxiety disorder, unspecified: Secondary | ICD-10-CM | POA: Diagnosis not present

## 2018-07-18 DIAGNOSIS — F331 Major depressive disorder, recurrent, moderate: Secondary | ICD-10-CM

## 2018-07-18 MED ORDER — ESCITALOPRAM OXALATE 20 MG PO TABS: 20.0000 mg | ORAL_TABLET | Freq: Every day | ORAL | 2 refills | Status: DC

## 2018-07-18 MED ORDER — LORAZEPAM 0.5 MG PO TABS: 0.5000 mg | ORAL_TABLET | Freq: Every day | ORAL | 1 refills | Status: DC | PRN

## 2018-07-18 MED ORDER — LAMOTRIGINE 150 MG PO TABS: 150.0000 mg | ORAL_TABLET | Freq: Every day | ORAL | 2 refills | Status: DC

## 2018-07-18 MED ORDER — BUPROPION HCL ER (SR) 150 MG PO TB12: ORAL_TABLET | ORAL | 2 refills | Status: DC

## 2018-07-18 NOTE — Progress Notes (Signed)
BH MD/PA/NP OP Progress Note  07/18/2018 4:08 PM Alice Bowers  MRN:  [id number]  Chief Complaint: I am diagnosed with breast cancer in September.  I am anxious.  I am doing chemotherapy and after that I will require radiation.  HPI: Alice Bowers came for her follow-up appointment.  She was last seen in June.  She apologized missing appointment because diagnosed with breast cancer.  She was very anxious after the diagnosis and call us to get some Ativan which she used to take in the past.  She took Ativan 0.5 mg few times at that help her anxiety.  She feels very nervous and anxious because of the diagnosis of cancer.  Her neighbor helps her as she does not have other good support system.  She is working part-time but she is not sure how long she can work because her job is not accommodating when she goes for chemotherapy.  She is seeing [name] for CBT.  She denies any suicidal thoughts or homicidal thought but continues to have episodes of depression, isolation and sometimes crying spells.  She was disappointed because her granddaughter did not went to shopping on Thanksgiving.  Her energy level is fair.  She is seeing oncologist at [facility].  Patient denies drinking or using any illegal substances.  She has no rash, itching or tremors.  She like increase Lamictal which was done in June visit.  Visit Diagnosis:    ICD-10-CM   1. Anxiety F41.9 LORazepam (ATIVAN) 0.5 MG tablet  2. Major depressive disorder, recurrent episode, moderate (HCC) F33.1 buPROPion (WELLBUTRIN SR) 150 MG 12 hr tablet    escitalopram (LEXAPRO) 20 MG tablet    lamoTRIgine (LAMICTAL) 150 MG tablet    Past Psychiatric History: Reviewed. History of depression since 1995. At that time she was going through divorce. History of 4 psychiatric hospitalization. One suicidal attempt by taking overdose on Seroquel. No history of paranoia, hallucination or aggressive behavior. Tried Paxil, Zoloft, Remeron(weight gain)  and Seroquel with limited response.   Past Medical History:  Past Medical History:  Diagnosis Date  . Anxiety   . Arthritis   . Depression   . Heart murmur   . High blood pressure   . High cholesterol   . Kidney stones   . Macular degeneration of left eye   . Obstructive sleep apnea   . Restless leg syndrome     Past Surgical History:  Procedure Laterality Date  . ABDOMINAL HYSTERECTOMY    . APPENDECTOMY    . arthroscopy left shoulder    . CHOLECYSTECTOMY    . joint fusion bilateral thumbs    . MASTECTOMY Right   . ORIF right ankle    . REPLACEMENT TOTAL KNEE BILATERAL      Family Psychiatric History: Reviewed.  Family History:  Family History  Problem Relation Age of Onset  . Depression Mother   . Depression Brother     Social History:  Social History   Socioeconomic History  . Marital status: Widowed    Spouse name: Not on file  . Number of children: Not on file  . Years of education: Not on file  . Highest education level: Not on file  Occupational History  . Not on file  Social Needs  . Financial resource strain: Not on file  . Food insecurity:    Worry: Not on file    Inability: Not on file  . Transportation needs:    Medical: Not on file    Non-medical:  Not on file  Tobacco Use  . Smoking status: Current Some Day Smoker    Packs/day: 1.00    Years: 20.00    Pack years: 20.00    Types: Cigarettes  . Smokeless tobacco: Never Used  Substance and Sexual Activity  . Alcohol use: Not Currently  . Drug use: No  . Sexual activity: Not Currently  Lifestyle  . Physical activity:    Days per week: Not on file    Minutes per session: Not on file  . Stress: Not on file  Relationships  . Social connections:    Talks on phone: Not on file    Gets together: Not on file    Attends religious service: Not on file    Active member of club or organization: Not on file    Attends meetings of clubs or organizations: Not on file    Relationship status:  Not on file  Other Topics Concern  . Not on file  Social History Narrative  . Not on file    Allergies:  Allergies  Allergen Reactions  . Azo [Phenazopyridine] Hives  . Pyridium [Phenazopyridine Hcl] Hives  . Requip [Ropinirole Hcl] Swelling  . Betadine [Povidone Iodine] Other (See Comments)    Burn rash  . Cephalosporins   . Demerol [Meperidine]   . Edta [Edetic Acid]   . Macrobid [Nitrofurantoin Macrocrystal]   . Sulfa Antibiotics   . Ceclor [Cefaclor] Rash  . Dynacirc [Isradipine] Rash  . Keflex [Cephalexin] Rash    Metabolic Disorder Labs: No results found for: HGBA1C, MPG No results found for: PROLACTIN No results found for: CHOL, TRIG, HDL, CHOLHDL, VLDL, LDLCALC No results found for: TSH  Therapeutic Level Labs: No results found for: LITHIUM No results found for: VALPROATE No components found for:  CBMZ  Current Medications: Current Outpatient Medications  Medication Sig Dispense Refill  . albuterol (ACCUNEB) 1.25 MG/3ML nebulizer solution Take 1 ampule by nebulization every 6 (six) hours as needed for wheezing.    . Albuterol Sulfate 108 (90 Base) MCG/ACT AEPB Inhale into the lungs.    Marland Kitchen allopurinol (ZYLOPRIM) 300 MG tablet Take 300 mg by mouth daily.    Marland Kitchen amLODipine (NORVASC) 5 MG tablet Take 5 mg by mouth daily.    Marland Kitchen Aprepitant 80 & 125 MG CAPS Take by mouth.    Marland Kitchen atorvastatin (LIPITOR) 40 MG tablet Take 40 mg by mouth daily.    Marland Kitchen buPROPion (WELLBUTRIN SR) 150 MG 12 hr tablet TAKE 2 TABLETS BY MOUTH EVERY MORNING AND 1 TABLET AT BEDTIME 90 tablet 0  . dexamethasone (DECADRON) 4 MG tablet Take by mouth.    . escitalopram (LEXAPRO) 20 MG tablet Take 1 tablet (20 mg total) by mouth daily. 30 tablet 2  . fluticasone furoate-vilanterol (BREO ELLIPTA) 200-25 MCG/INH AEPB Inhale 1 puff into the lungs daily.    . folic acid (FOLVITE) 1 MG tablet Take 1 mg by mouth daily.    . hydrochlorothiazide (HYDRODIURIL) 25 MG tablet Take 25 mg by mouth daily.    Marland Kitchen  lamoTRIgine (LAMICTAL) 100 MG tablet TAKE 1 TABLET BY MOUTH EVERY DAY 30 tablet 0  . losartan (COZAAR) 100 MG tablet Take 100 mg by mouth daily.    . pramipexole (MIRAPEX) 0.5 MG tablet Take 0.5 mg by mouth 2 (two) times daily.    . Armodafinil (NUVIGIL) 150 MG tablet Take 150 mg by mouth daily.    Marland Kitchen LORazepam (ATIVAN) 0.5 MG tablet Take 1 tablet (0.5 mg total) by  mouth daily as needed for anxiety. (Patient not taking: Reported on 07/18/2018) 10 tablet 0   No current facility-administered medications for this visit.      Musculoskeletal: Strength & Muscle Tone: within normal limits Gait & Station: normal Patient leans: N/A  Psychiatric Specialty Exam: ROS  Blood pressure (!) 144/77, pulse 93, height 5\' 6"  (1.676 m), weight 254 lb (115.2 kg).Body mass index is 41 kg/m.  General Appearance: Casual  Eye Contact:  Good  Speech:  Clear and Coherent  Volume:  Normal  Mood:  Dysphoric  Affect:  Congruent  Thought Process:  Goal Directed  Orientation:  Full (Time, Place, and Person)  Thought Content: Rumination   Suicidal Thoughts:  No  Homicidal Thoughts:  No  Memory:  Immediate;   Good Recent;   Good Remote;   Good  Judgement:  Good  Insight:  Present  Psychomotor Activity:  Normal  Concentration:  Concentration: Good and Attention Span: Good  Recall:  Good  Fund of Knowledge: Good  Language: Good  Akathisia:  No  Handed:  Right  AIMS (if indicated): not done  Assets:  Communication Skills Desire for Improvement Housing Resilience Social Support Talents/Skills  ADL's:  Intact  Cognition: WNL  Sleep:  Fair   Screenings:   Assessment and Plan: Major depressive disorder, recurrent.  Generalized anxiety disorder.  Reassurance given.  Patient now diagnosed with breast cancer and getting treatment.  Encouraged to continue therapy with [name].  I will increase Lamictal 150 mg daily which is helping her depression.  Continue Wellbutrin SR 450 mg daily Lexapro 20 mg daily.   Patient tried Ativan in the past with good response to help her anxiety.  I will continue Ativan 0.5 mg to take as needed for severe anxiety.  We will provide 20 tablets with one more additional refill.  Discussed benzodiazepine dependence tolerance and withdrawal.  Recommended to call us back if she has any question or any concern.  Follow-up in 3 months.   Kathlee Nations, MD 07/18/2018, 4:08 PM

## 2018-07-19 ENCOUNTER — Encounter (HOSPITAL_COMMUNITY): Payer: Self-pay | Admitting: Licensed Clinical Social Worker

## 2018-07-19 ENCOUNTER — Ambulatory Visit (INDEPENDENT_AMBULATORY_CARE_PROVIDER_SITE_OTHER): Payer: BLUE CROSS/BLUE SHIELD | Admitting: Licensed Clinical Social Worker

## 2018-07-19 DIAGNOSIS — F330 Major depressive disorder, recurrent, mild: Secondary | ICD-10-CM

## 2018-07-19 NOTE — Progress Notes (Signed)
   THERAPIST PROGRESS NOTE  Session Time: 8:05am-9:00am  Participation Level: Active  Behavioral Response: Well GroomedAlertDepressed  Type of Therapy: Individual Therapy   Treatment Goals addressed: Diagnosis: MDD, bereavement   Interventions: CBT and Supportive   Summary: Alice Bowers is a 67 y.o. female who presents with MDD and Bereavement    Suicidal/Homicidal: Nowithout intent/plan   Therapist Response: [name] met with clinician for an individual session. Alice Bowers discussed her psychiatric symptoms, her current life events and her goals for therapy. Alice Bowers shared that she has now had 2 chemotherapy treatments. She reports she has already started losing hair and she wore a wig today to session. Clinician explored coping with this and noted positive motivation to carry on with her treatments. She reported that she is young enough and healthy enough to fight and this is what she'll do. Clinician explored conversation she had with her son-in-law about her treatment and her interactions with granddaughter. Alice Bowers identified that son-in-law will talk to granddaughter about this and encourage her to spend more time with [name]. Alice Bowers reports concern that she may lose her job due to absences. Clinician explored options in the event that this may happen. She reports having another job interview in [place], which may be a better option for her due to proximity to home.  Clinician validated and reflected feelings and noted the importance of self-care and self-compassion.    Plan: Return again in 2 weeks.   Diagnosis:      MDD and Grief  [name], LCSW 07/19/2018

## 2018-07-24 ENCOUNTER — Ambulatory Visit (INDEPENDENT_AMBULATORY_CARE_PROVIDER_SITE_OTHER): Payer: Medicare PPO | Admitting: Licensed Clinical Social Worker

## 2018-07-24 ENCOUNTER — Encounter (HOSPITAL_COMMUNITY): Payer: Self-pay | Admitting: Licensed Clinical Social Worker

## 2018-07-24 DIAGNOSIS — F330 Major depressive disorder, recurrent, mild: Secondary | ICD-10-CM | POA: Diagnosis not present

## 2018-07-24 NOTE — Progress Notes (Signed)
   THERAPIST PROGRESS NOTE  Session Time: 8:00am-8:45am  Participation Level: Active  Behavioral Response: Well GroomedAlertEuthymic  Type of Therapy: Individual Therapy   Treatment Goals addressed: Diagnosis: MDD, bereavement   Interventions: CBT and Supportive   Summary: Alice Bowers is a 67 y.o. female who presents with MDD and Bereavement    Suicidal/Homicidal: Nowithout intent/plan   Therapist Response: [name] met with clinician for an individual session. Alice Bowers discussed her psychiatric symptoms, her current life events and her goals for therapy. 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 She identified that chemotherapy is going well, but her legs and feet are very swollen and she has not received a call back from her doctor/nurse. Clinician processed feelings about being demoted at work. 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 She also reported feeling disappointed that the other job interview was for a similar position, not a supervisor position.  Alice Bowers was able to find a bright side, which was that her granddaughter visited her over the weekend and they had a good time together. She reported feeling a full and happy heart and hoped that her granddaughter will come back and spend time over the coming weekend.     Plan: Return again in 1 week.   Diagnosis:      MDD and Grief  [name], LCSW 07/24/2018

## 2018-08-01 ENCOUNTER — Encounter (HOSPITAL_COMMUNITY): Payer: Self-pay | Admitting: Licensed Clinical Social Worker

## 2018-08-01 ENCOUNTER — Ambulatory Visit (INDEPENDENT_AMBULATORY_CARE_PROVIDER_SITE_OTHER): Payer: BLUE CROSS/BLUE SHIELD | Admitting: Licensed Clinical Social Worker

## 2018-08-01 DIAGNOSIS — F330 Major depressive disorder, recurrent, mild: Secondary | ICD-10-CM | POA: Diagnosis not present

## 2018-08-01 NOTE — Progress Notes (Signed)
   THERAPIST PROGRESS NOTE  Session Time: 9:00am-10:00am  Participation Level: Active  Behavioral Response: Well GroomedAlertEuthymic  Type of Therapy: Individual Therapy   Treatment Goals addressed: Diagnosis: MDD, bereavement   Interventions: CBT and Supportive   Summary: Alice Bowers is a 67 y.o. female who presents with MDD and Bereavement    Suicidal/Homicidal: Nowithout intent/plan   Therapist Response: [name] met with clinician for an individual session. Shalondra discussed her psychiatric symptoms, her current life events and her goals for therapy. Amarra shared that she has been spending a little more time with her granddaughter and son-in-law. She reports her granddaughter now knows about her breast cancer and was curious if Juliah would die. Clinician processed those emotions and identified the importance of Jilliane's attitude and resilience in her cancer treatment. Clinician also identified the importance of Delano's self care and encouraged her to do as much as she could, but to take her time and back off as needed. Brityn reports feeling more energy with her granddaughter around her. However, she reports that the chemotherapy makes her very fatigued and she even feels exhausted after getting up to let the dogs out.    Plan: Return again in 2 weeks.   Diagnosis:      MDD and Grief  [name], LCSW 08/01/2018

## 2018-08-06 ENCOUNTER — Encounter (HOSPITAL_COMMUNITY): Payer: Self-pay | Admitting: Licensed Clinical Social Worker

## 2018-08-06 ENCOUNTER — Ambulatory Visit (INDEPENDENT_AMBULATORY_CARE_PROVIDER_SITE_OTHER): Payer: BLUE CROSS/BLUE SHIELD | Admitting: Licensed Clinical Social Worker

## 2018-08-06 DIAGNOSIS — F330 Major depressive disorder, recurrent, mild: Secondary | ICD-10-CM | POA: Diagnosis not present

## 2018-08-06 NOTE — Progress Notes (Signed)
   THERAPIST PROGRESS NOTE  Session Time: 8:00am-9:00am  Participation Level: Active  Behavioral Response: Well GroomedAlertEuthymic  Type of Therapy: Individual Therapy   Treatment Goals addressed: Diagnosis: MDD, bereavement   Interventions: CBT and Supportive   Summary: Alice Bowers is a 67 y.o. female who presents with MDD and Bereavement    Suicidal/Homicidal: Nowithout intent/plan   Therapist Response: [name] met with clinician for an individual session. Catori discussed her psychiatric symptoms, her current life events and her goals for therapy. [name] shared that she has been pretty fatigued lately, following chemotherapy. She reports that the 6-7 day after treatment is when her immunity is lowest and she feels the most tired. Marrianne reports she has not found a job yet, but she has been getting some interest in her resume. Clinician explored the necessity of working and also encouraged Yashvi to re-evaluate her benefits and monthly bills. Clinician identified the importance of self-care and maintaining her energy as much as possible in order to focus on her wellness.    Plan: Return again in 2 weeks.   Diagnosis:      MDD and Grief  [name], LCSW 08/06/2018

## 2018-08-14 ENCOUNTER — Encounter (HOSPITAL_COMMUNITY): Payer: Self-pay | Admitting: Licensed Clinical Social Worker

## 2018-08-14 ENCOUNTER — Ambulatory Visit (INDEPENDENT_AMBULATORY_CARE_PROVIDER_SITE_OTHER): Payer: BLUE CROSS/BLUE SHIELD | Admitting: Licensed Clinical Social Worker

## 2018-08-14 DIAGNOSIS — F330 Major depressive disorder, recurrent, mild: Secondary | ICD-10-CM

## 2018-08-14 NOTE — Progress Notes (Signed)
   THERAPIST PROGRESS NOTE  Session Time: 8:00am-8:55am  Participation Level: Active  Behavioral Response: Well GroomedAlertEuthymic  Type of Therapy: Individual Therapy   Treatment Goals addressed: Diagnosis: MDD, bereavement   Interventions: CBT and Supportive   Summary: Alice Bowers is a 67 y.o. female who presents with MDD and Bereavement    Suicidal/Homicidal: Nowithout intent/plan   Therapist Response: [name] met with clinician for an individual session. Alice Bowers discussed her psychiatric symptoms, her current life events and her goals for therapy. [name] shared that she has been feeling pretty good and positive about her cancer treatment. She reports the hardest part has been losing her hair. Clinician processed thoughts and feelings about losing her hair and identified the importance of hair to most people. Alice Bowers identified that she feels confident that her treatment will work and she will live through cancer. Clinician explored thoughts and feelings about death, God's plan, and heaven. Alice Bowers processed her beliefs and reported no fear, but also confidence that she will be fine.    Plan: Return again in 2 weeks.   Diagnosis:      MDD and Grief [name], LCSW 08/14/2018

## 2018-08-29 ENCOUNTER — Ambulatory Visit (INDEPENDENT_AMBULATORY_CARE_PROVIDER_SITE_OTHER): Payer: Medicare PPO | Admitting: Licensed Clinical Social Worker

## 2018-08-29 ENCOUNTER — Encounter (HOSPITAL_COMMUNITY): Payer: Self-pay | Admitting: Licensed Clinical Social Worker

## 2018-08-29 DIAGNOSIS — F330 Major depressive disorder, recurrent, mild: Secondary | ICD-10-CM | POA: Diagnosis not present

## 2018-08-29 DIAGNOSIS — F4321 Adjustment disorder with depressed mood: Secondary | ICD-10-CM | POA: Diagnosis not present

## 2018-08-29 NOTE — Progress Notes (Signed)
   THERAPIST PROGRESS NOTE  Session Time: 9:05am-10:00am  Participation Level: Active  Behavioral Response: Well GroomedAlertEuthymic  Type of Therapy: Individual Therapy   Treatment Goals addressed: Diagnosis: MDD, bereavement   Interventions: CBT and Supportive   Summary: Alice Bowers is a 68 y.o. female who presents with MDD and Bereavement    Suicidal/Homicidal: Nowithout intent/plan   Therapist Response: [name] met with clinician for an individual session. Deandra discussed her psychiatric symptoms, her current life events and her goals for therapy. Saquoia shared that she continues to move through her chemotherapy and she is not have major side effects other than tiredness and some back pain with the new weekly medication. Adiba identified frustration and hurt feelings that her friends and family who have offered help and support have not actually come through. Clinician explored thoughts and feelings using MI. Clinician encouraged Jeylin to ask again and actually schedule a time for her step-granddaughter to come help her clean the house. Aryonna identified that due to her back pain, she is unable to wash more than a few dishes at a time before she has to sit down. Clinician encouraged Sheilyn to do things in small increments, such has putting away 5 items at a time and then resting.    Plan: Return again in 2 weeks.   Diagnosis:      MDD and Grief [name], LCSW 08/29/2018

## 2018-09-05 ENCOUNTER — Encounter (HOSPITAL_COMMUNITY): Payer: Self-pay | Admitting: Licensed Clinical Social Worker

## 2018-09-05 ENCOUNTER — Ambulatory Visit (INDEPENDENT_AMBULATORY_CARE_PROVIDER_SITE_OTHER): Payer: Medicare PPO | Admitting: Licensed Clinical Social Worker

## 2018-09-05 DIAGNOSIS — F330 Major depressive disorder, recurrent, mild: Secondary | ICD-10-CM | POA: Diagnosis not present

## 2018-09-05 NOTE — Progress Notes (Signed)
   THERAPIST PROGRESS NOTE  Session Time: 9:00am-9:55am  Participation Level: Active  Behavioral Response: Well GroomedAlertAnxious and Euthymic  Type of Therapy: Individual Therapy   Treatment Goals addressed: Diagnosis: MDD, bereavement   Interventions: CBT and Supportive   Summary: Alice Bowers is a 68 y.o. female who presents with MDD, recurrent, mild    Suicidal/Homicidal: Nowithout intent/plan   Therapist Response: [name] met with clinician for an individual session. Safina discussed her psychiatric symptoms, her current life events and her goals for therapy. Waynetta shared that she has had 2 doses of Taxalt, which is a treatment for her breast cancer. She reports she gets a lot of muscle and bone soreness several days after the treatment. This will go on for 10 more weeks and then she may start radiation. Clinician explored mood and motivation for wellness. Larosa continues to have a positive attitude, but reports now more concerns about finances. Clinician processed thoughts and feelings about her financial situation and encouraged her to continue looking into other options for financial support and ways to reduce spending/bills.    Plan: Return again in 2 weeks.   Diagnosis:      MDD, recurrent, mild  [name], LCSW 09/05/2018

## 2018-09-10 IMAGING — DX DG CHEST 2V
2 series · 2 of 2 positions shown · non-contrast
Comparison: None.

CLINICAL DATA: Pain and nausea for 3 weeks.  Hypertension

EXAM:
CHEST  2 VIEW

[chest pa]
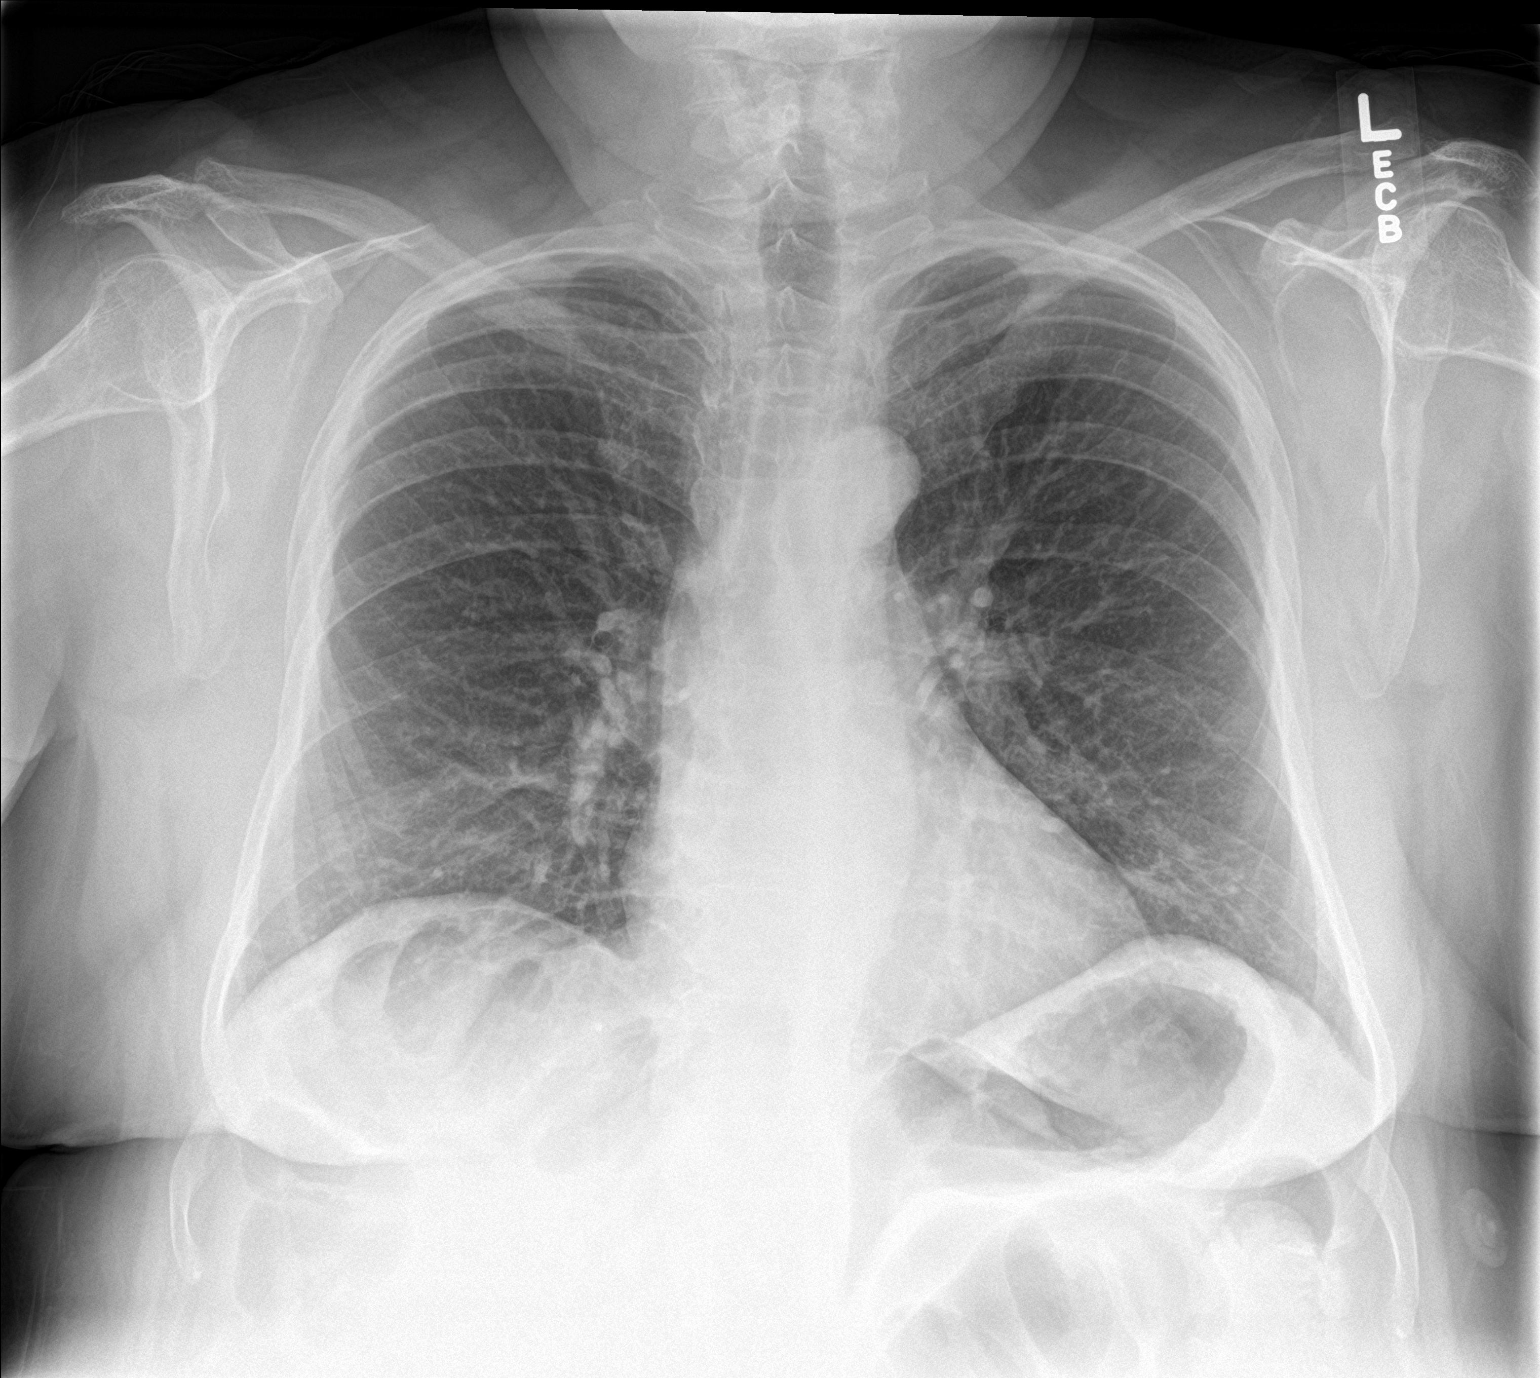

[chest lat]
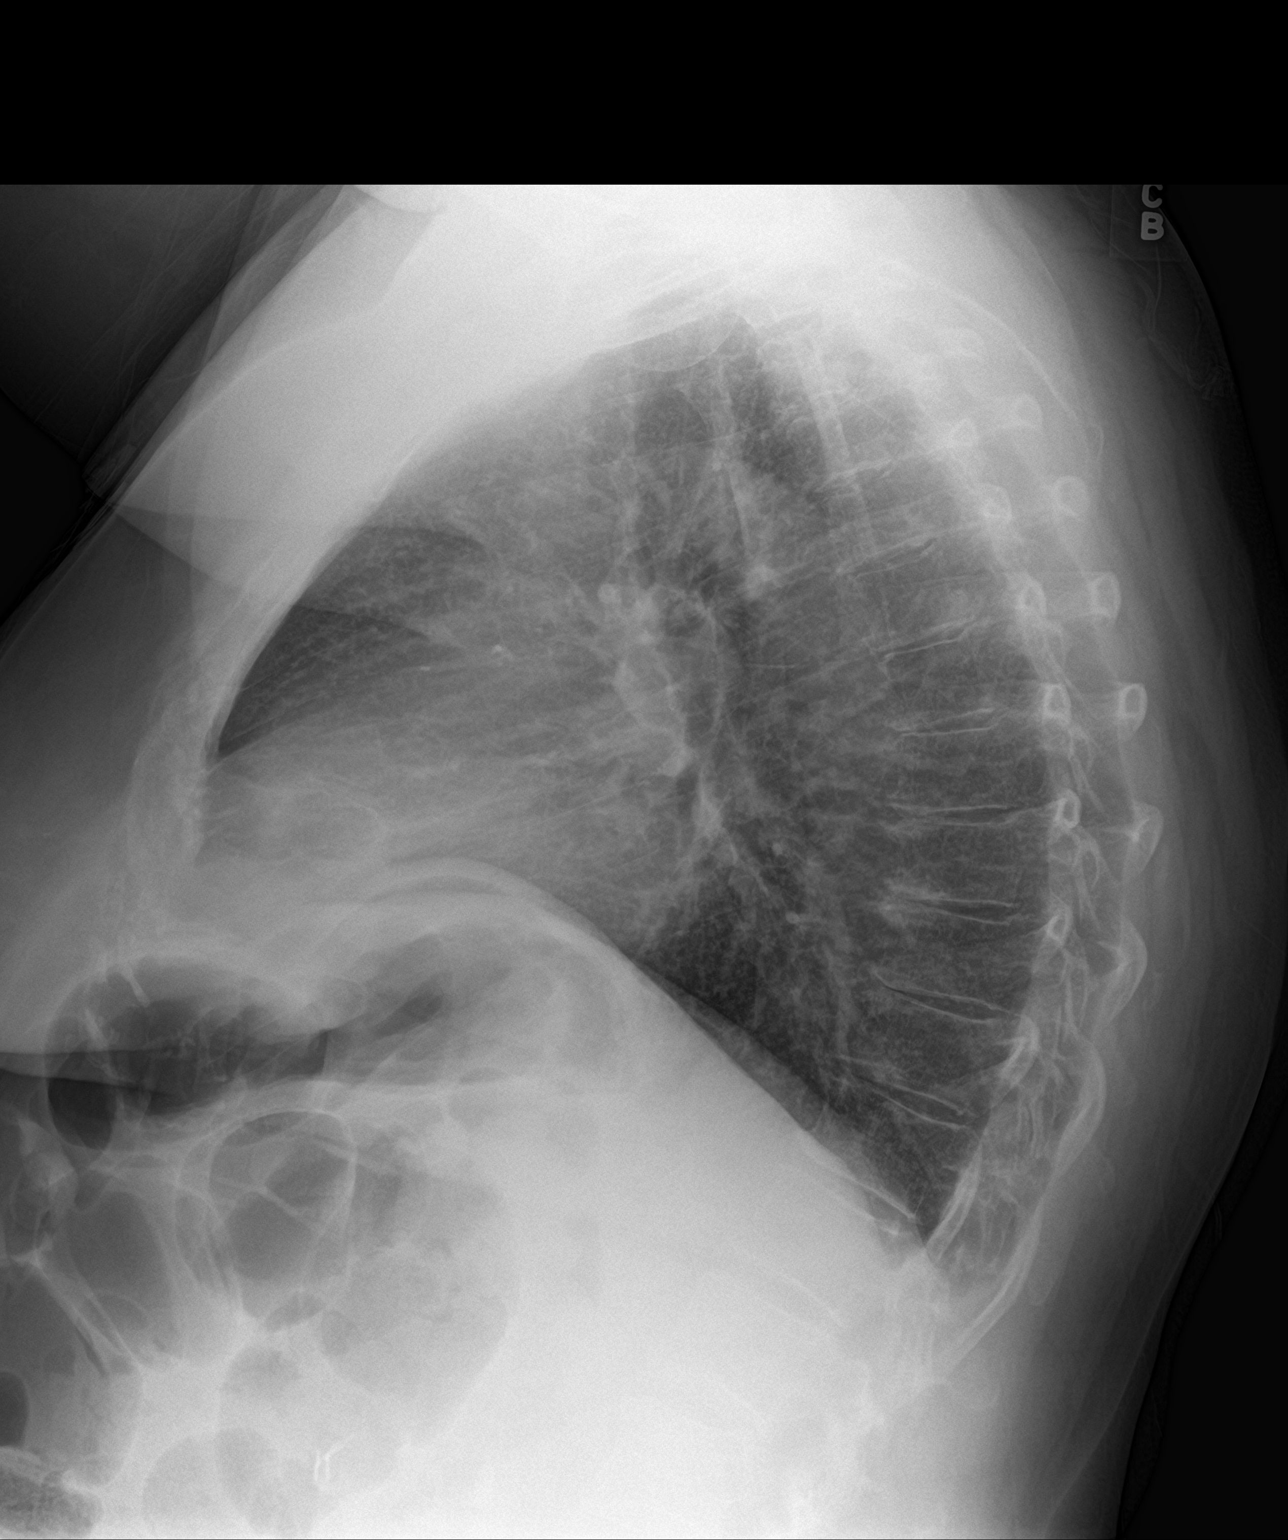

[2 of 2 positions shown; findings below may reference images not displayed]

FINDINGS: Lungs are clear. Heart size and pulmonary vascularity are normal. No
adenopathy. There is colonic interposition between the right
hemidiaphragm and liver.
IMPRESSION: No edema or consolidation.

## 2018-09-12 ENCOUNTER — Ambulatory Visit (INDEPENDENT_AMBULATORY_CARE_PROVIDER_SITE_OTHER): Payer: Medicare PPO | Admitting: Licensed Clinical Social Worker

## 2018-09-12 ENCOUNTER — Encounter (HOSPITAL_COMMUNITY): Payer: Self-pay | Admitting: Licensed Clinical Social Worker

## 2018-09-12 DIAGNOSIS — F330 Major depressive disorder, recurrent, mild: Secondary | ICD-10-CM

## 2018-09-12 NOTE — Progress Notes (Signed)
   THERAPIST PROGRESS NOTE  Session Time: 9:05am-9:55am  Participation Level: Active  Behavioral Response: Well GroomedAlertEuthymic  Type of Therapy: Individual Therapy   Treatment Goals addressed: Diagnosis: MDD, bereavement   Interventions: CBT and Supportive   Summary: Alice Bowers is a 68 y.o. female who presents with MDD, mild   Suicidal/Homicidal: Nowithout intent/plan   Therapist Response: [name] met with clinician for an individual session. Dymond discussed her psychiatric symptoms, her current life events and her goals for therapy. Rhealynn shared that she had her second round of this type of chemotherapy, she will have 9 more treatments. She reported feeling tired and a little sick over the past few days, but otherwise okay. Clinician processed thoughts and feelings about the treatment and her progress through cancer. Clinician noted the positive attitude about this and discussed the importance of having the positive attitude to help healing.  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 She identified some frustration that she was not getting many hours, but clinician also noted that she needs time to heal.    Plan: Return again in 2 weeks.   Diagnosis:      MDD, mild  [name], LCSW 09/12/2018

## 2018-10-02 ENCOUNTER — Encounter (HOSPITAL_COMMUNITY): Payer: Self-pay | Admitting: Licensed Clinical Social Worker

## 2018-10-02 ENCOUNTER — Ambulatory Visit (INDEPENDENT_AMBULATORY_CARE_PROVIDER_SITE_OTHER): Payer: Medicare PPO | Admitting: Licensed Clinical Social Worker

## 2018-10-02 DIAGNOSIS — F330 Major depressive disorder, recurrent, mild: Secondary | ICD-10-CM

## 2018-10-02 NOTE — Progress Notes (Signed)
   THERAPIST PROGRESS NOTE  Session Time: 9:05am-10:00am  Participation Level: Active  Behavioral Response: Well GroomedAlertEuthymic  Type of Therapy: Individual Therapy   Treatment Goals addressed: Diagnosis: MDD, bereavement   Interventions: CBT and Supportive   Summary: Alice Bowers is a 68 y.o. female who presents with MDD    Suicidal/Homicidal: Nowithout intent/plan   Therapist Response: [name] met with clinician for an individual session. Alice Bowers discussed her psychiatric symptoms, her current life events and her goals for therapy. Alice Bowers shared that she is very stressed about her finances. She reports some problems with her medical billing through Medicare and was able to process mistakes made by the hospital. Clinician discussed and praised Alice Bowers's ability to understand her EOBs and encouraged her to continue this so she does not end up with thousands of dollars in medical bills. Clinician explored interactions with family members and discussed relationship with granddaughter and son. Alice Bowers identified some improvement, but mostly due to her starting conversations and initiating contact.  Alice Bowers reports she will start radiation treatment through the [facility] next month. This will be daily. Clinician assisted in providing information about the location of the clinic and parking.    Plan: Return again in 2 weeks.   Diagnosis:      MDD   [name], LCSW 10/02/2018

## 2018-10-09 ENCOUNTER — Ambulatory Visit (INDEPENDENT_AMBULATORY_CARE_PROVIDER_SITE_OTHER): Payer: Medicare PPO | Admitting: Licensed Clinical Social Worker

## 2018-10-09 DIAGNOSIS — F330 Major depressive disorder, recurrent, mild: Secondary | ICD-10-CM

## 2018-10-10 ENCOUNTER — Encounter (HOSPITAL_COMMUNITY): Payer: Self-pay | Admitting: Licensed Clinical Social Worker

## 2018-10-10 NOTE — Progress Notes (Signed)
   THERAPIST PROGRESS NOTE  Session Time: 10:00am-11:00am  Participation Level: Active  Behavioral Response: Well GroomedAlertIrritable   Type of Therapy: Individual Therapy   Treatment Goals addressed: Diagnosis: MDD, bereavement   Interventions: CBT and Supportive   Summary: Alice Bowers is a 68 y.o. female who presents with MDD and Bereavement    Suicidal/Homicidal: Nowithout intent/plan   Therapist Response: [name] met with clinician for an individual session. Alice Bowers discussed her psychiatric symptoms, her current life events and her goals for therapy. Alice Bowers shared that she has been frustrated over her financial issues, especially with medical bills. She reports receiving bills for over $26,000 due to cancer treatment. Clinician explored ways for Alice Bowers to challenge these bills, especially since Medicare had not been billed. Alice Bowers had already contacted the hospital billing department and will have the bills resubmitted to insurance before she starts paying. 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  Clinician praised and encouraged Alice Bowers for her ability to stand up for herself and not let others run over her.    Plan: Return again in 2 weeks.   Diagnosis:      MDD and Grief Alice Curling, LCSW 10/10/2018

## 2018-10-16 ENCOUNTER — Other Ambulatory Visit (HOSPITAL_COMMUNITY): Payer: Self-pay

## 2018-10-16 ENCOUNTER — Encounter (HOSPITAL_COMMUNITY): Payer: Self-pay | Admitting: Licensed Clinical Social Worker

## 2018-10-16 ENCOUNTER — Ambulatory Visit (INDEPENDENT_AMBULATORY_CARE_PROVIDER_SITE_OTHER): Payer: Medicare PPO | Admitting: Licensed Clinical Social Worker

## 2018-10-16 DIAGNOSIS — F331 Major depressive disorder, recurrent, moderate: Secondary | ICD-10-CM

## 2018-10-16 DIAGNOSIS — F330 Major depressive disorder, recurrent, mild: Secondary | ICD-10-CM | POA: Diagnosis not present

## 2018-10-16 MED ORDER — ESCITALOPRAM OXALATE 20 MG PO TABS: 20.0000 mg | ORAL_TABLET | Freq: Every day | ORAL | 0 refills | Status: DC

## 2018-10-16 NOTE — Progress Notes (Signed)
   THERAPIST PROGRESS NOTE  Session Time: 9:00am-10:00am  Participation Level: Active  Behavioral Response: Well GroomedAlertEuthymic  Type of Therapy: Individual Therapy   Treatment Goals addressed: Diagnosis: MDD   Interventions: CBT and Supportive   Summary: Shawnia Vizcarrondo is a 68 y.o. female who presents with MDD and Bereavement    Suicidal/Homicidal: Nowithout intent/plan   Therapist Response: [name] met with clinician for an individual session. Caeley discussed her psychiatric symptoms, her current life events and her goals for therapy. Izella shared that she has been feeling okay, but a little shaky yesterday and today. She reported feeling a lot of weakness Sunday in her legs and having to take many sitting breaks while cooking dinner, as well as dropping "everything". Clinician noted that today she seems to be a little shaky in voice and hands. Teonna reports it varies daily, especially when she had chemotherapy yesterday. 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 Clinician provided supportive therapy and assisted in problem solving. Roshaunda continues to have a positive attitude overall, which has been very helpful in her coping with breast cancer treatments. She will have chemo weekly through the end of March. She will then have radiation daily for several weeks.    Plan: Return again in 1 week.   Diagnosis:      MDD  [name], LCSW 10/16/2018

## 2018-10-17 ENCOUNTER — Ambulatory Visit (HOSPITAL_COMMUNITY): Payer: Medicare Other | Admitting: Psychiatry

## 2018-10-23 ENCOUNTER — Encounter (HOSPITAL_COMMUNITY): Payer: Self-pay | Admitting: Licensed Clinical Social Worker

## 2018-10-23 ENCOUNTER — Ambulatory Visit (INDEPENDENT_AMBULATORY_CARE_PROVIDER_SITE_OTHER): Payer: Medicare PPO | Admitting: Licensed Clinical Social Worker

## 2018-10-23 DIAGNOSIS — F330 Major depressive disorder, recurrent, mild: Secondary | ICD-10-CM

## 2018-10-23 DIAGNOSIS — F4321 Adjustment disorder with depressed mood: Secondary | ICD-10-CM | POA: Diagnosis not present

## 2018-10-23 NOTE — Progress Notes (Signed)
   THERAPIST PROGRESS NOTE  Session Time: 8:00am-8:45am  Participation Level: Active  Behavioral Response: Well GroomedAlertDepressed  Type of Therapy: Individual Therapy   Treatment Goals addressed: Diagnosis: MDD, bereavement   Interventions: CBT and Supportive   Summary: Zuriel Yeaman is a 68 y.o. female who presents with MDD and Bereavement    Suicidal/Homicidal: Nowithout intent/plan   Therapist Response: [name] met with clinician for an individual session. Jewelia discussed her psychiatric symptoms, her current life events and her goals for therapy. Mercedez shared that she has been doing okay, but she heard a song in the car that reminded her of her daughter and she broke down. Clinician normalized this experience and assisted in processing thoughts and feelings using MI. Clinician discussed the way grief changes and noted that especially during challenging times, grief may present itself. Clinician discussed progress toward cancer treatment and noted only 3 more chemotherapy treatments and then radiation will start. Clinician processed thoughts and feelings about making daily radiation treatments and financial concerns.    Plan: Return again in 1-2 weeks.   Diagnosis:      MDD and Grief [name], LCSW 10/23/2018

## 2018-10-30 ENCOUNTER — Encounter (HOSPITAL_COMMUNITY): Payer: Self-pay | Admitting: Licensed Clinical Social Worker

## 2018-10-30 ENCOUNTER — Ambulatory Visit (INDEPENDENT_AMBULATORY_CARE_PROVIDER_SITE_OTHER): Payer: Medicare PPO | Admitting: Licensed Clinical Social Worker

## 2018-10-30 ENCOUNTER — Other Ambulatory Visit (HOSPITAL_COMMUNITY): Payer: Self-pay

## 2018-10-30 ENCOUNTER — Other Ambulatory Visit: Payer: Self-pay

## 2018-10-30 DIAGNOSIS — F330 Major depressive disorder, recurrent, mild: Secondary | ICD-10-CM

## 2018-10-30 DIAGNOSIS — F331 Major depressive disorder, recurrent, moderate: Secondary | ICD-10-CM

## 2018-10-30 MED ORDER — LAMOTRIGINE 150 MG PO TABS: 150.0000 mg | ORAL_TABLET | Freq: Every day | ORAL | 0 refills | Status: DC

## 2018-10-30 NOTE — Progress Notes (Signed)
   THERAPIST PROGRESS NOTE  Session Time: 8:00am-8:55am  Participation Level: Active  Behavioral Response: Well GroomedAlertEuthymic  Type of Therapy: Individual Therapy   Treatment Goals addressed: Diagnosis: MDD, bereavement   Interventions: CBT and Supportive   Summary: Alice Bowers is a 68 y.o. female who presents with MDD, mild   Suicidal/Homicidal: Nowithout intent/plan   Therapist Response: [name] met with clinician for an individual session. Alice Bowers discussed her psychiatric symptoms, her current life events and her goals for therapy. Alice Bowers shared that she continues to cope with the side effects of her chemotherapy medication, which has caused her nails to start coming off and an infection in one of the nail beds. Clinician utilized MI OARS to reflect and summarize thoughts and feelings. Clinician discussed coping skills: laughing, smiling, having a good attitude. Alice Bowers processed interactions with family members, as well as concerns about finances. Clinician utilized MI to reflect and summarize thoughts and feelings.    Plan: Return again in 2 weeks.   Diagnosis:      MDD, mild  [name], LCSW 10/30/2018

## 2018-11-08 ENCOUNTER — Other Ambulatory Visit: Payer: Self-pay

## 2018-11-08 ENCOUNTER — Encounter (HOSPITAL_COMMUNITY): Payer: Self-pay | Admitting: Licensed Clinical Social Worker

## 2018-11-08 ENCOUNTER — Ambulatory Visit (INDEPENDENT_AMBULATORY_CARE_PROVIDER_SITE_OTHER): Payer: Medicare PPO | Admitting: Licensed Clinical Social Worker

## 2018-11-08 DIAGNOSIS — F330 Major depressive disorder, recurrent, mild: Secondary | ICD-10-CM | POA: Diagnosis not present

## 2018-11-08 NOTE — Progress Notes (Signed)
Virtual Visit via Telephone Note  I connected with Ariely Riddell on 11/08/18 at  8:00 AM EDT by telephone and verified that I am speaking with the correct person using two identifiers.   I discussed the limitations, risks, security and privacy concerns of performing an evaluation and management service by telephone and the availability of in person appointments. I also discussed with the patient that there may be a patient responsible charge related to this service. The patient expressed understanding and agreed to proceed.    Type of Therapy: Individual Therapy   Treatment Goals addressed: Diagnosis: MDD, bereavement   Interventions: CBT and Supportive   Summary: Seidy Labreck is a 68 y.o. female who presents with MDD   Suicidal/Homicidal: Nowithout intent/plan   Therapist Response: [name] met with clinician for an individual session. Saraiyah discussed her psychiatric symptoms, her current life events and her goals for therapy. Sherrice shared that she is doing okay, just concerned about being able to find groceries. Clinician explored options in her area and encouraged her to go out during the first hour for the "senior" shopping times. Clinician also explored ability to reach out to her son-in-law and his daughter to help her. Rhylen reports she has been receiving more phone calls this week from people checking on her, even from her son in [place]. Clinician processed interactions and identified improved feelings of love and support from her friends and family. Diedra reports increased fatigue due to Taxalt, but reports she only has one more treatment before moving to radiation.  Clinician processed concerns about staying away from people and being able to remain calm in these situations.    Plan: Return again in 1-2 weeks.   Diagnosis:      MDD    I discussed the assessment and treatment plan with the patient. The patient was provided an opportunity to ask questions and all were  answered. The patient agreed with the plan and demonstrated an understanding of the instructions.   The patient was advised to call back or seek an in-person evaluation if the symptoms worsen or if the condition fails to improve as anticipated.  I provided 45 minutes of non-face-to-face time during this encounter.   [name], LCSW

## 2018-11-09 ENCOUNTER — Other Ambulatory Visit: Payer: Self-pay

## 2018-11-09 ENCOUNTER — Telehealth (INDEPENDENT_AMBULATORY_CARE_PROVIDER_SITE_OTHER): Payer: Medicare PPO | Admitting: Psychiatry

## 2018-11-09 DIAGNOSIS — F331 Major depressive disorder, recurrent, moderate: Secondary | ICD-10-CM

## 2018-11-09 DIAGNOSIS — F410 Panic disorder [episodic paroxysmal anxiety] without agoraphobia: Secondary | ICD-10-CM | POA: Diagnosis not present

## 2018-11-09 DIAGNOSIS — F411 Generalized anxiety disorder: Secondary | ICD-10-CM | POA: Diagnosis not present

## 2018-11-09 DIAGNOSIS — F419 Anxiety disorder, unspecified: Secondary | ICD-10-CM

## 2018-11-09 DIAGNOSIS — F339 Major depressive disorder, recurrent, unspecified: Secondary | ICD-10-CM | POA: Diagnosis not present

## 2018-11-09 MED ORDER — BUPROPION HCL ER (SR) 150 MG PO TB12: ORAL_TABLET | ORAL | 2 refills | Status: DC

## 2018-11-09 MED ORDER — ESCITALOPRAM OXALATE 20 MG PO TABS: 20.0000 mg | ORAL_TABLET | Freq: Every day | ORAL | 2 refills | Status: DC

## 2018-11-09 MED ORDER — LAMOTRIGINE 150 MG PO TABS: 150.0000 mg | ORAL_TABLET | Freq: Every day | ORAL | 2 refills | Status: DC

## 2018-11-09 NOTE — Progress Notes (Signed)
Virtual Visit via Telephone Note  I connected with Alice Bowers on 11/09/18 at 11:00 AM EDT by telephone and verified that I am speaking with the correct person using two identifiers.   I discussed the limitations, risks, security and privacy concerns of performing an evaluation and management service by telephone and the availability of in person appointments. I also discussed with the patient that there may be a patient responsible charge related to this service. The patient expressed understanding and agreed to proceed.  History of presenting illness; Patient was evaluated by phone session.  She is doing chemotherapy and she has 1 more cycle.  She admitted lately more tired and exhausted.  She reported her hair is now falling off.  She feels anxious but denies any suicidal thoughts.  She is hoping to get better soon.  She told that she is resilient and she will fight back.  Patient has a breast cancer.  Her neighbor does help her to take to the doctor's appointment and chemotherapy treatment.  On her last visit we increase Lamictal and she reported it does help her depression.  She denies any crying spells or any feeling of hopelessness or worthlessness.  She has no rash, itching tremors or shakes.  She like to continue her current medication which are Lamictal, Wellbutrin, Lexapro.  She has not taken Ativan more than 2 times since the last visit.  She is getting therapy from [name] which is also helping her coping skills.  She does not want to change her medication.  Her appetite is okay.  Past Psychiatric History: Reviewed. History of depression since 1995. At that time she was going through divorce. History of 4 psychiatric hospitalization. One suicidal attempt by taking overdose on Seroquel. No history of paranoia, hallucination or aggressive behavior. Tried Paxil, Zoloft, Remeron(weight gain) and Seroquel with limited response.    Mental status examination: Limited mental status  examination done on the phone.  Patient reported her mood is tired due to recent chemotherapy but denies any active or passive suicidal thoughts or homicidal thought.  Her speech appears to be normal, logical.  There were no flight of ideas or any loose association.  She denies any auditory or visual hallucination.  She denies any paranoia or delusions.  She did not report any tremors or any EPS.  She is alert and oriented x3.  Her cognition is intact.  Her fund of knowledge is adequate.  She did not reported any delusions, paranoia or any mania.  Her insight and judgment is okay.  Assessment and Plan: Major depressive disorder, recurrent.  Generalized anxiety disorder.  Panic attacks.  Patient is dealing better her breast cancer treatment despite chemotherapy making her very tired she is resilient.  She reported no side effects of the medication.  She denies any tremors or any rash.  She like to continue her current medication and like to continue therapy with [name] for CBT.  I will continue Wellbutrin SR 450 mg daily, Lexapro 20 mg daily, Lamictal 150 mg daily.  She does not need Ativan at this time as she has still refill remaining.  However she will call us if she needs the medication.  We discussed serotonin syndrome due to multiple psychotropic medication.  Recommended to call us back if is any question or any concern.  I will see her again in 3 months.  Follow Up Instructions:    I discussed the assessment and treatment plan with the patient. The patient was provided an opportunity to  ask questions and all were answered. The patient agreed with the plan and demonstrated an understanding of the instructions.   The patient was advised to call back or seek an in-person evaluation if the symptoms worsen or if the condition fails to improve as anticipated.  I provided 15 minutes of non-face-to-face time during this encounter.   Kathlee Nations, MD

## 2018-11-14 NOTE — Progress Notes (Signed)
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  05/28/2018: Stage IIIA (pT1c, pN3a, cM0, G2, ER+, PR+, HER2-)  Did patient present with symptoms (if so, please note symptoms) or was this found on screening mammography?:   Diagnostic Mammogram 04/23/2018: 3 suspicious masses in the right breast.  Mildly suspicious right axillary lymph nodes.  Nothing for malignancy in the left breast.   Histology per Pathology Report: Biopsy 04/26/2018   Receptor Status: ER(+), PR (+), Her2-neu (-), Ki-()   Past/Anticipated interventions by surgeon, if any: Dr. Denton Meek 05/15/2018 -Right mastectomy and LN dissection. -2 foci of invasive carcinoma (1.4 and 1.8 cm) with metastatic carcinoma to 15 out of 16 lymph nodes.  Past/Anticipated interventions by medical oncology, if any: Chemotherapy  Dr. Jacques Earthly 11/05/2018 -Invasive lobular carcinoma of right breast in female, multifocal disease (at least 3 sites) with right axillary disease, ER/PR positive HER-2 negative. -Completed 4 cycles Doxorubicin/cyclophosphamide (AC) -Weekly taxol x12 weeks.  Week 12 11/12/2018.  Lymphedema issues, if any: Still has some swelling in her right arm.    Pain issues, if any: No  SAFETY ISSUES:  Prior radiation? No  Pacemaker/ICD? No  Possible current pregnancy? Abdominal hysterectomy  Is the patient on methotrexate? No  Current Complaints / other details:   -Port insertion 05/15/2018    Cori Razor, RN 11/14/2018,2:11 PM

## 2018-11-15 ENCOUNTER — Ambulatory Visit
Admission: RE | Admit: 2018-11-15 | Discharge: 2018-11-15 | Disposition: A | Discharge status: Home / Self Care | Payer: Medicare PPO | Source: Ambulatory Visit | Attending: Radiation Oncology | Admitting: Radiation Oncology

## 2018-11-15 ENCOUNTER — Encounter (HOSPITAL_COMMUNITY): Payer: Self-pay | Admitting: Licensed Clinical Social Worker

## 2018-11-15 ENCOUNTER — Ambulatory Visit (INDEPENDENT_AMBULATORY_CARE_PROVIDER_SITE_OTHER): Payer: Medicare PPO | Admitting: Licensed Clinical Social Worker

## 2018-11-15 ENCOUNTER — Encounter: Payer: Self-pay | Admitting: Radiation Oncology

## 2018-11-15 ENCOUNTER — Other Ambulatory Visit: Payer: Self-pay

## 2018-11-15 DIAGNOSIS — G4733 Obstructive sleep apnea (adult) (pediatric): Secondary | ICD-10-CM | POA: Insufficient documentation

## 2018-11-15 DIAGNOSIS — E7800 Pure hypercholesterolemia, unspecified: Secondary | ICD-10-CM | POA: Insufficient documentation

## 2018-11-15 DIAGNOSIS — C50411 Malignant neoplasm of upper-outer quadrant of right female breast: Secondary | ICD-10-CM | POA: Insufficient documentation

## 2018-11-15 DIAGNOSIS — F17210 Nicotine dependence, cigarettes, uncomplicated: Secondary | ICD-10-CM | POA: Insufficient documentation

## 2018-11-15 DIAGNOSIS — Z510 Encounter for antineoplastic radiation therapy: Secondary | ICD-10-CM | POA: Insufficient documentation

## 2018-11-15 DIAGNOSIS — Z96653 Presence of artificial knee joint, bilateral: Secondary | ICD-10-CM | POA: Insufficient documentation

## 2018-11-15 DIAGNOSIS — F419 Anxiety disorder, unspecified: Secondary | ICD-10-CM | POA: Diagnosis not present

## 2018-11-15 DIAGNOSIS — Z170 Estrogen receptor positive status [ER+]: Secondary | ICD-10-CM

## 2018-11-15 DIAGNOSIS — C50911 Malignant neoplasm of unspecified site of right female breast: Secondary | ICD-10-CM | POA: Insufficient documentation

## 2018-11-15 DIAGNOSIS — Z7951 Long term (current) use of inhaled steroids: Secondary | ICD-10-CM | POA: Insufficient documentation

## 2018-11-15 DIAGNOSIS — Z79899 Other long term (current) drug therapy: Secondary | ICD-10-CM | POA: Insufficient documentation

## 2018-11-15 DIAGNOSIS — F330 Major depressive disorder, recurrent, mild: Secondary | ICD-10-CM | POA: Diagnosis not present

## 2018-11-15 DIAGNOSIS — Z9011 Acquired absence of right breast and nipple: Secondary | ICD-10-CM | POA: Insufficient documentation

## 2018-11-15 DIAGNOSIS — G2581 Restless legs syndrome: Secondary | ICD-10-CM | POA: Insufficient documentation

## 2018-11-15 DIAGNOSIS — Z9221 Personal history of antineoplastic chemotherapy: Secondary | ICD-10-CM | POA: Insufficient documentation

## 2018-11-15 DIAGNOSIS — F329 Major depressive disorder, single episode, unspecified: Secondary | ICD-10-CM | POA: Insufficient documentation

## 2018-11-15 DIAGNOSIS — Z7952 Long term (current) use of systemic steroids: Secondary | ICD-10-CM | POA: Insufficient documentation

## 2018-11-15 NOTE — Progress Notes (Signed)
Radiation Oncology         [phone]) [phone] ________________________________  Name: Alice Bowers        MRN: [id number]  Date of Service: 11/15/2018 DOB: 1950-12-14  HR:CBULAGT, No Pcp Per  Adam Phenix*     REFERRING PHYSICIAN: Adam Phenix*   DIAGNOSIS: The encounter diagnosis was Malignant neoplasm of right breast in female, estrogen receptor positive, unspecified site of breast ([place]).   HISTORY OF PRESENT ILLNESS: Alice Bowers is a 68 y.o. female seen at the request of Dr. Jacques Earthly for a history of Stage III ER/PR Positive invasive lobular carcinoma of the right breast. She initially presented due to pain and palpable abnormalities in the right breast. Diagnostic imaging identified concerns with multifocal abnormalities and at least 3 abnormal nodes. A biopsy on 04/26/18 revealled a grade 2 invasive lobular carcinoma, ER/PR positive, HER2 negative, and her sampled node also had carcinoma. She elected to proceed with right mastectomy and ALND which she underwent on 05/15/18. Final pathology revealed 2 foci of invasive lobular carcinoma measuring 1.8 and 1.4 cm, and LCIS was present. Her margins were negative but 15/16 sampled nodes were noted. She went on to complete chemotherapy and just finished her last taxol dose on Monday of this week. She is seen today with telemedicine for consideration of radiotherapy closer to home.     PREVIOUS RADIATION THERAPY: No   PAST MEDICAL HISTORY:  Past Medical History:  Diagnosis Date  . Anxiety   . Arthritis   . Depression   . Heart murmur   . High blood pressure   . High cholesterol   . Kidney stones   . Macular degeneration of left eye   . Obstructive sleep apnea   . Restless leg syndrome        PAST SURGICAL HISTORY: Past Surgical History:  Procedure Laterality Date  . ABDOMINAL HYSTERECTOMY    . APPENDECTOMY    . arthroscopy left shoulder    . CHOLECYSTECTOMY    . joint fusion bilateral thumbs    .  MASTECTOMY Right   . ORIF right ankle    . REPLACEMENT TOTAL KNEE BILATERAL       FAMILY HISTORY:  Family History  Problem Relation Age of Onset  . Depression Mother   . Depression Brother      SOCIAL HISTORY:  reports that she has been smoking cigarettes. She has a 20.00 pack-year smoking history. She has never used smokeless tobacco. She reports previous alcohol use. She reports that she does not use drugs.   ALLERGIES: Azo [phenazopyridine]; Pyridium [phenazopyridine hcl]; Requip [ropinirole hcl]; Betadine [povidone iodine]; Cephalosporins; Demerol [meperidine]; Edta [edetic acid]; Macrobid [nitrofurantoin macrocrystal]; Sulfa antibiotics; Ceclor [cefaclor]; Dynacirc [isradipine]; and Keflex [cephalexin]   MEDICATIONS:  Current Outpatient Medications  Medication Sig Dispense Refill  . albuterol (ACCUNEB) 1.25 MG/3ML nebulizer solution Take 1 ampule by nebulization every 6 (six) hours as needed for wheezing.    . Albuterol Sulfate 108 (90 Base) MCG/ACT AEPB Inhale into the lungs.    Marland Kitchen allopurinol (ZYLOPRIM) 300 MG tablet Take 300 mg by mouth daily.    Marland Kitchen amLODipine (NORVASC) 5 MG tablet Take 5 mg by mouth daily.    Marland Kitchen Aprepitant 80 & 125 MG CAPS Take by mouth.    Marland Kitchen atorvastatin (LIPITOR) 40 MG tablet Take 40 mg by mouth daily.    Marland Kitchen buPROPion (WELLBUTRIN SR) 150 MG 12 hr tablet TAKE 2 TABLETS BY MOUTH EVERY MORNING AND 1 TABLET AT BEDTIME 90 tablet 2  .  dexamethasone (DECADRON) 4 MG tablet Take by mouth.    . escitalopram (LEXAPRO) 20 MG tablet Take 1 tablet (20 mg total) by mouth daily. 30 tablet 2  . fluticasone furoate-vilanterol (BREO ELLIPTA) 200-25 MCG/INH AEPB Inhale 1 puff into the lungs daily.    . folic acid (FOLVITE) 1 MG tablet Take 1 mg by mouth daily.    . hydrochlorothiazide (HYDRODIURIL) 25 MG tablet Take 25 mg by mouth daily.    Marland Kitchen lamoTRIgine (LAMICTAL) 150 MG tablet Take 1 tablet (150 mg total) by mouth daily. 30 tablet 2  . LORazepam (ATIVAN) 0.5 MG tablet Take 1  tablet (0.5 mg total) by mouth daily as needed for anxiety. 20 tablet 1  . losartan (COZAAR) 100 MG tablet Take 100 mg by mouth daily.    . pramipexole (MIRAPEX) 0.5 MG tablet Take 0.5 mg by mouth 2 (two) times daily.     No current facility-administered medications for this encounter.      REVIEW OF SYSTEMS: On review of systems, the patient reports that she is doing well overall. She reports her fingernails and hair fell out from chemotherapy. She denies any chest pain, shortness of breath, cough, fevers, chills, night sweats, unintended weight changes. She denies any bowel or bladder disturbances, and denies abdominal pain, nausea or vomiting. She denies any new musculoskeletal or joint aches or pains. A complete review of systems is obtained and is otherwise negative.     PHYSICAL EXAM:  Wt Readings from Last 3 Encounters:  12/08/16 240 lb (108.9 kg)   Temp Readings from Last 3 Encounters:  12/08/16 98.6 F (37 C) (Oral)   BP Readings from Last 3 Encounters:  12/08/16 (!) 151/81   Pulse Readings from Last 3 Encounters:  12/08/16 99    In general this is a well appearing caucasian female in no acute distress. She is alert and oriented x4 and appropriate throughout the examination. HEENT reveals that the patient is normocephalic, atraumatic. Cardiopulmonary assessment is negative for acute distress and she exhibits normal effort. Chest wall exam is deferred to her simulation.   ECOG = 1  0 - Asymptomatic (Fully active, able to carry on all predisease activities without restriction)  1 - Symptomatic but completely ambulatory (Restricted in physically strenuous activity but ambulatory and able to carry out work of a light or sedentary nature. For example, light housework, office work)  2 - Symptomatic, <50% in bed during the day (Ambulatory and capable of all self care but unable to carry out any work activities. Up and about more than 50% of waking hours)  3 - Symptomatic, >50%  in bed, but not bedbound (Capable of only limited self-care, confined to bed or chair 50% or more of waking hours)  4 - Bedbound (Completely disabled. Cannot carry on any self-care. Totally confined to bed or chair)  5 - Death   Eustace Pen MM, Creech RH, Tormey DC, et al. [id number]). "Toxicity and response criteria of the [facility]". [place] Oncol. 5 (6): 649-55    LABORATORY DATA:  Lab Results  Component Value Date   WBC 11.0 (H) 12/08/2016   HGB 14.5 12/08/2016   HCT 43.5 12/08/2016   MCV 89.7 12/08/2016   PLT 338 12/08/2016   Lab Results  Component Value Date   NA 137 12/08/2016   K 3.8 12/08/2016   CL 103 12/08/2016   CO2 23 12/08/2016   No results found for: ALT, AST, GGT, ALKPHOS, BILITOT    RADIOGRAPHY:  No results found.     IMPRESSION/PLAN: 1. 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 Dr. Lisbeth Renshaw discusses the pathology findings and reviews the nature of her right breast disease, and outlines the findings and work up thus far. She has completed chemotherapy this week, and Dr. Lisbeth Renshaw discusses she would be benefited by external radiotherapy to the breast to reduce the risk of local recurrence. She would continue to be followed by medical oncology and start antiestrogen therapy thereafter. We discussed the risks, benefits, short, and long term effects of radiotherapy, and the patient is interested in proceeding. Dr. Lisbeth Renshaw discusses the delivery and logistics of radiotherapy and anticipates a course of 6 1/2 weeks of radiotherapy. She will proceed with simulation in about 4 weeks and begin treatment thereafter. We reviewed consent verbally and will sign formal paper consent at the time of simulation.  2. Possible early lymphedema. The patient reports she has had some concerns for right arm swelling but has not been able to get in with PT due to coronavirus through the [place] system. I'll place an order to  see if she can be seen given her symptoms through [facility]'s PT program.  This encounter was provided by telemedicine platform Webex.  The patient has given verbal consent for this type of encounter and has been advised to only accept a meeting of this type in a secure network environment. The time spent during this encounter was 45 minutes. The attendants for this meeting include Blenda Nicely, RN, Dr. Lisbeth Renshaw,  Hayden Pedro  and Trixie Deis.  During the encounter, Blenda Nicely, RN, Dr. Lisbeth Renshaw, and  Hayden Pedro were located at [facility].  Adabelle Griffiths was located at home.   In a visit lasting 45 minutes, greater than 50% of the time was spent face to face discussing her case, and coordinating the patient's care.   The above documentation reflects my direct findings during this shared patient visit. Please see the separate note by Dr. Lisbeth Renshaw on this date for the remainder of the patient's plan of care.    Carola Rhine, PAC

## 2018-11-15 NOTE — Progress Notes (Signed)
Virtual Visit via Video Note  I connected with Alice Bowers on 11/15/18 at  9:00 AM EDT by a video enabled telemedicine application and verified that I am speaking with the correct person using two identifiers.   I discussed the limitations of evaluation and management by telemedicine and the availability of in person appointments. The patient expressed understanding and agreed to proceed.  Type of Therapy: Individual Therapy   Treatment Goals addressed: Diagnosis: MDD, bereavement   Interventions: CBT and Supportive   Summary: Alice Bowers is a 68 y.o. female who presents with MDD and Bereavement    Suicidal/Homicidal: Nowithout intent/plan   Therapist Response: Alice Bowers met with clinician for an individual session. [place] discussed her psychiatric symptoms, her current life events and her goals for therapy. [name] shared that she felt frustrated due to not being able to get in contact with the clinician over the phone. Clinician discussed the "run around" she dealt with navigating the phone system and assisted in providing direct phone number to clinician. Alice Bowers processed recent news from the PA at her Oncologist's office regarding the type of breast cancer she has the the option to remove her other breast, as the risk for recurring cancer in the other breast is relatively high. Clinician processed this option and identified several options using CBT. Alice Bowers was able to identify pros and cons of this surgery, as well as reality testing about the recovery vs possibly going through cancer treatment again in the future. Alice Bowers also identified needing to consider her granddaughter in this decision, as it would be extremely painful for her granddaughter to have to live through Alice Bowers going through the cancer treatment and possibly dying again.  Clinician encouraged Alice Bowers to reach out to her oncologist to discuss options and to learn more about time frames and preventative care.     Plan:  Return again in 2 weeks.   Diagnosis:      MDD and Grief  I discussed the assessment and treatment plan with the patient. The patient was provided an opportunity to ask questions and all were answered. The patient agreed with the plan and demonstrated an understanding of the instructions.   The patient was advised to call back or seek an in-person evaluation if the symptoms worsen or if the condition fails to improve as anticipated.  I provided 45 minutes of non-face-to-face time during this encounter.   Alice Curling, LCSW

## 2018-11-22 ENCOUNTER — Encounter: Payer: Self-pay | Admitting: Rehabilitation

## 2018-11-22 ENCOUNTER — Ambulatory Visit: Payer: Medicare PPO | Attending: Radiation Oncology | Admitting: Rehabilitation

## 2018-11-22 ENCOUNTER — Other Ambulatory Visit: Payer: Self-pay

## 2018-11-22 DIAGNOSIS — Z170 Estrogen receptor positive status [ER+]: Secondary | ICD-10-CM | POA: Insufficient documentation

## 2018-11-22 DIAGNOSIS — C50911 Malignant neoplasm of unspecified site of right female breast: Secondary | ICD-10-CM

## 2018-11-22 DIAGNOSIS — M25611 Stiffness of right shoulder, not elsewhere classified: Secondary | ICD-10-CM | POA: Diagnosis present

## 2018-11-22 DIAGNOSIS — I972 Postmastectomy lymphedema syndrome: Secondary | ICD-10-CM

## 2018-11-22 NOTE — Therapy (Signed)
[place], [place], [address] Phone: [phone]   Fax:  [phone]  Physical Therapy Treatment  Patient Details  Name: Alice Bowers MRN: [id number] Date of Birth: 01/01/1951 Referring Provider (PT): Dr. Lisbeth Renshaw   Encounter Date: 11/22/2018  PT End of Session - 11/22/18 2217    Visit Number  1    Number of Visits  9    Date for PT Re-Evaluation  12/20/18    Authorization Type  [employer] needs PA    PT Start Time  1203    PT Stop Time  1250    PT Time Calculation (min)  47 min    Activity Tolerance  Patient tolerated treatment well    Behavior During Therapy  [facility] for tasks assessed/performed       Past Medical History:  Diagnosis Date  . Anxiety   . Arthritis   . Depression   . Heart murmur   . High blood pressure   . High cholesterol   . Kidney stones   . Macular degeneration of left eye   . Macular degeneration of right eye   . Obstructive sleep apnea   . Restless leg syndrome     Past Surgical History:  Procedure Laterality Date  . ABDOMINAL HYSTERECTOMY    . APPENDECTOMY    . arthroscopy left shoulder    . CHOLECYSTECTOMY    . joint fusion bilateral thumbs    . MASTECTOMY Right 2019  . ORIF right ankle    . REPLACEMENT TOTAL KNEE BILATERAL      There were no vitals filed for this visit.  Subjective Assessment - 11/22/18 1208    Subjective  Swelling in the Rt UE started in the middle of Chemo.  Maybe some swelling under the arm.     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 15/16 nodes positive. Chemotherapy complete radiation starting soon    Limitations  --   reports none   Patient Stated Goals  get the swelling under control     Currently in Pain?  No/denies         [facility] PT Assessment - 11/22/18 0001       Assessment   Medical Diagnosis  Rt breast cancer    Referring Provider (PT)  Dr. Lisbeth Renshaw    Onset Date/Surgical Date  05/15/18    Hand Dominance  Right    Prior Therapy  no      Precautions   Precaution Comments  lymphedema Rt, cancer history      Restrictions   Weight Bearing Restrictions  No      Balance Screen   Has the patient fallen in the past 6 months  No    Has the patient had a decrease in activity level because of a fear of falling?   No    Is the patient reluctant to leave their home because of a fear of falling?   No      Home Environment   Living Environment  Private residence    Living Arrangements  Alone    Type of [place] Access  Stairs to enter      Prior Function   Level of Independence  Independent    Vocation  Retired    Landscape architect  Cognition   Overall Cognitive Status  Within Functional Limits for tasks assessed      Observation/Other Assessments   Observations  well healed mastectomy incision excess skin under Rt axilla with some edema present here      Sensation   Light Touch  Appears Intact      Coordination   Gross Motor Movements are Fluid and Coordinated  Yes      Posture/Postural Control   Posture/Postural Control  Postural limitations    Postural Limitations  Rounded Shoulders;Forward head;Increased thoracic kyphosis      ROM / Strength   AROM / PROM / Strength  AROM;Strength      AROM   AROM Assessment Site  Shoulder    Right/Left Shoulder  Right;Left    Right Shoulder Flexion  110 Degrees    Right Shoulder ABduction  137 Degrees    Right Shoulder Internal Rotation  70 Degrees    Right Shoulder External Rotation  80 Degrees    Left Shoulder Flexion  132 Degrees    Left Shoulder ABduction  152 Degrees    Left Shoulder Internal Rotation  90 Degrees    Left Shoulder External Rotation  90 Degrees      Palpation   Palpation comment  +2 pitting edema Rt hand        LYMPHEDEMA/ONCOLOGY QUESTIONNAIRE -  11/22/18 1224      Type   Cancer Type  Rt breast cancer      Surgeries   Mastectomy Date  05/15/18    Axillary Lymph Node Dissection Date  05/15/18    Number Lymph Nodes Removed  16      Date Lymphedema/Swelling Started   Date  06/29/18      Treatment   Active Chemotherapy Treatment  No    Past Chemotherapy Treatment  Yes    Active Radiation Treatment  No    Past Radiation Treatment  No      What other symptoms do you have   Are you Having Heaviness or Tightness  Yes    Are you having pitting edema  Yes    Is it Hard or Difficult finding clothes that fit  No    Is there Decreased scar mobility  No      Lymphedema Assessments   Lymphedema Assessments  Upper extremities      Right Upper Extremity Lymphedema   15 cm Proximal to Olecranon Process  44.9 cm    10 cm Proximal to Olecranon Process  42.1 cm    Olecranon Process  33 cm    15 cm Proximal to Ulnar Styloid Process  30.5 cm    10 cm Proximal to Ulnar Styloid Process  26.2 cm    Just Proximal to Ulnar Styloid Process  19.5 cm    Across Hand at [employer]  21.1 cm    At [place] of 2nd Digit  7 cm    Other  10      Left Upper Extremity Lymphedema   15 cm Proximal to Olecranon Process  43 cm    10 cm Proximal to Olecranon Process  40.2 cm    Olecranon Process  31.3 cm    15 cm Proximal to Ulnar Styloid Process  30 cm    10 cm Proximal to Ulnar Styloid Process  25.5 cm    Just Proximal to Ulnar Styloid Process  17.6 cm    Across Hand at [employer]  18 cm    At  Base of 2nd Digit  6.8 cm    Other  11                [facility] Adult PT Treatment/Exercise - 11/22/18 0001      Manual Therapy   Manual Therapy  Edema management    Manual therapy comments  gave pt small tg soft from fingers to elbow and then medium tg soft from hand to axilla due to large size difference between upper and lower arm. Educated on when to remove it and how to wash    Edema Management  discussed options for treatment at this time.   Due to financial concerns pt is interested in learning how to bandage at this time and self MLD.  Pt will check with cancer center in [place] about helping pay for garments or will consider alight qualification             PT Education - 11/22/18 2216    Education Details  POC, options for treatment at this time, use of tg soft    Person(s) Educated  Patient    Methods  Explanation          PT Long Term Goals - 11/22/18 2224      PT LONG TERM GOAL #1   Title  Pt will be ind with self bandaging or velcro garment for reduction phase     Time  4    Period  Weeks    Status  New      PT LONG TERM GOAL #2   Title  Pt will be educated on lymphedema and risk reduction principles    Time  4    Period  Weeks    Status  New      PT LONG TERM GOAL #3   Title  Pt will be knowledgeable about shoulder ROM and strengthening to prepare for radiation and after    Time  4    Period  Weeks    Status  New      PT LONG TERM GOAL #4   Title  Pt will be educated on self MLD    Time  4    Period  Weeks    Status  New            Plan - 11/22/18 2218    Clinical Impression Statement  Pt presents today with lymphedema of the Rt UE and into the lateral trunk after 16 nodes removed in the axilla.  Circumferential measurements around 2cm larger on the Rt with pitting present in the hand.  Shoulder ROM also decreased for radiation which will begin in a few weeks.  Discussed treatment options today with pt interested in learning to bandage due to finances.      Personal Factors and Comorbidities  Comorbidity 2;Other    Comorbidities  lymph node removal with positive status, major depressive episode, lives in [place]    Examination-Activity Limitations  Carry;Lift    Examination-Participation Restrictions  Yard Work;Cleaning    Stability/Clinical Decision Making  Evolving/Moderate complexity    Clinical Decision Making  Moderate    Rehab Potential  Good    PT Frequency  2x / week    PT Duration   4 weeks    PT Treatment/Interventions  ADLs/Self Care Home Management;Therapeutic exercise;Patient/family education;Manual techniques;Manual lymph drainage;Scar mobilization;Compression bandaging;Passive range of motion    PT Next Visit Plan  give handouts on lymphedema and risk reduction, begin bandaging education, MLD education, shoulder ROM exercises  Recommended Other Services  pt will check with her clinic about funds for garments maybe alight?       Patient will benefit from skilled therapeutic intervention in order to improve the following deficits and impairments:  Increased edema, Decreased mobility, Postural dysfunction, Impaired UE functional use, Decreased strength  Visit Diagnosis: Malignant neoplasm of right breast in female, estrogen receptor positive, unspecified site of breast ([place])  Postmastectomy lymphedema  Stiffness of right shoulder, not elsewhere classified     Problem List Patient Active Problem List   Diagnosis Date Noted  . Malignant neoplasm of right breast in female, estrogen receptor positive ([place]) 11/15/2018  . Impaired fasting glucose 09/02/2015  . Obesity, Class III, BMI 40-49.9 (morbid obesity) ([place]) 05/14/2015  . Tobacco dependence 02/11/2015  . Heterozygous MTHFR mutation C677T ([place]) 10/13/2014  . Major depressive disorder, recurrent episode, moderate ([place]) 10/03/2014  . Recurrent major depressive disorder, in partial remission ([place]) 10/03/2014  . OSA on CPAP 02/13/2014  . Mixed hyperlipidemia 02/13/2014  . Osteoarthritis 02/13/2014  . Restless legs syndrome (RLS) 02/13/2014  . 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 History of kidney stones 02/13/2014  . Benign essential hypertension 02/13/2014  . Asthma 02/13/2014    Shan Levans, PT 11/22/2018, 10:27 PM  [place], [place], [address] Phone: [phone]   Fax:  [phone]  Name: Alice Bowers MRN:  [id number] Date of Birth: 11/23/1950

## 2018-11-29 ENCOUNTER — Other Ambulatory Visit: Payer: Self-pay

## 2018-11-29 ENCOUNTER — Encounter: Payer: Self-pay | Admitting: Rehabilitation

## 2018-11-29 ENCOUNTER — Ambulatory Visit: Payer: Medicare PPO | Admitting: Rehabilitation

## 2018-11-29 DIAGNOSIS — M25611 Stiffness of right shoulder, not elsewhere classified: Secondary | ICD-10-CM

## 2018-11-29 DIAGNOSIS — Z170 Estrogen receptor positive status [ER+]: Principal | ICD-10-CM

## 2018-11-29 DIAGNOSIS — I972 Postmastectomy lymphedema syndrome: Secondary | ICD-10-CM

## 2018-11-29 DIAGNOSIS — C50911 Malignant neoplasm of unspecified site of right female breast: Secondary | ICD-10-CM

## 2018-11-29 NOTE — Therapy (Signed)
[place], [place], [address] Phone: [phone]   Fax:  [phone]  Physical Therapy Treatment  Patient Details  Name: Alice Bowers MRN: [id number] Date of Birth: [birth year] Referring Provider (PT): Dr. Lisbeth Renshaw   Encounter Date: 11/29/2018  PT End of Session - 11/29/18 1228    Visit Number  2    Number of Visits  9    Date for PT Re-Evaluation  12/20/18    Authorization Type  visits approved    PT Start Time  1005    PT Stop Time  1115    PT Time Calculation (min)  70 min    Activity Tolerance  Patient tolerated treatment well    Behavior During Therapy  [facility] for tasks assessed/performed       Past Medical History:  Diagnosis Date  . Anxiety   . Arthritis   . Depression   . Heart murmur   . High blood pressure   . High cholesterol   . Kidney stones   . Macular degeneration of left eye   . Macular degeneration of right eye   . Obstructive sleep apnea   . Restless leg syndrome     Past Surgical History:  Procedure Laterality Date  . ABDOMINAL HYSTERECTOMY    . APPENDECTOMY    . arthroscopy left shoulder    . CHOLECYSTECTOMY    . joint fusion bilateral thumbs    . MASTECTOMY Right 2019  . ORIF right ankle    . REPLACEMENT TOTAL KNEE BILATERAL      There were no vitals filed for this visit.  Subjective Assessment - 11/29/18 1007    Subjective  Nothing new.  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 15/16 nodes positive. Chemotherapy complete radiation starting soon    Patient Stated Goals  get the swelling under control     Currently in Pain?  No/denies                       [facility] Adult PT Treatment/Exercise - 11/29/18 0001      Manual Therapy   Manual Therapy   Compression Bandaging    Manual therapy comments  measured pt for isotoner glove as pt reports this is most likely going to be too much for her to handle at home.      Edema Management  lengthy education on self bandaging going over steps, products, where to buy products, video and visual instructions given.  Each step performed by PT on self and then on patient and then with patient performing.  Sig vcs and tcs needed.  Education on how to care for the bandages and when to remove them.     Compression Bandaging  PT performed final bandaging for pt to wear home.  size 9 stockinette, finger wraps all fingers, 1-6cm, 1-8cm, and 1-10 cm bandage applied             PT Education - 11/29/18 1228    Education Details  self bandanging and bandage care    Person(s) Educated  Patient    Methods  Explanation;Demonstration;Tactile cues;Verbal cues;Handout    Comprehension  Verbalized understanding;Returned demonstration;Tactile cues required;Need further instruction;Verbal cues required          PT  Long Term Goals - 11/29/18 1232      PT LONG TERM GOAL #1   Title  Pt will be ind with self bandaging or velcro garment for reduction phase     Status  Partially Met      PT LONG TERM GOAL #2   Title  Pt will be educated on lymphedema and risk reduction principles    Status  Not Met      PT LONG TERM GOAL #3   Title  Pt will be knowledgeable about shoulder ROM and strengthening to prepare for radiation and after    Status  Not Met      PT LONG TERM GOAL #4   Title  Pt will be educated on self MLD    Status  Not Met            Plan - 11/29/18 1229    Clinical Impression Statement  Pt presents today for bandaging education.  All education complete with pt performing techniques moderately well.  Most difficulty with the finger bandages.  Given information on how to order isotoner glove.  Pt reports her cancer center will help pay for garments after reduction.  Pt will attempt to  rebandage until next visit on Tuesday and will decide if velcro would be easier for her.      Comorbidities  lymph node removal with positive status, major depressive episode, lives in [place]    PT Treatment/Interventions  ADLs/Self Care Home Management;Therapeutic exercise;Patient/family education;Manual techniques;Manual lymph drainage;Scar mobilization;Compression bandaging;Passive range of motion    PT Next Visit Plan  how was bandaging? give handouts on lymphedema and risk reduction, MLD education, remedial exercises    Consulted and Agree with Plan of Care  Patient       Patient will benefit from skilled therapeutic intervention in order to improve the following deficits and impairments:     Visit Diagnosis: Malignant neoplasm of right breast in female, estrogen receptor positive, unspecified site of breast ([place])  Postmastectomy lymphedema  Stiffness of right shoulder, not elsewhere classified     Problem List Patient Active Problem List   Diagnosis Date Noted  . Malignant neoplasm of right breast in female, estrogen receptor positive ([place]) 11/15/2018  . Impaired fasting glucose 09/02/2015  . Obesity, Class III, BMI 40-49.9 (morbid obesity) ([place]) 05/14/2015  . Tobacco dependence 02/11/2015  . Heterozygous MTHFR mutation C677T ([place]) 10/13/2014  . Major depressive disorder, recurrent episode, moderate ([place]) 10/03/2014  . Recurrent major depressive disorder, in partial remission ([place]) 10/03/2014  . OSA on CPAP 02/13/2014  . Mixed hyperlipidemia 02/13/2014  . Osteoarthritis 02/13/2014  . Restless legs syndrome (RLS) 02/13/2014  . 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 History of kidney stones 02/13/2014  . Benign essential hypertension 02/13/2014  . Asthma 02/13/2014    Shan Levans, PT 11/29/2018, 12:35 PM  [place], [place], [address] Phone: [phone]   Fax:  [phone]  Name: Alice Bowers MRN: [id number] Date of Birth: 06-Dec-1950

## 2018-11-29 NOTE — Patient Instructions (Addendum)
Self bandaging the arm:   Follow along with this video:  Https://www.youtube.com/watch?v=[email or web address]  -OR-  Search in your internet browser: "self bandaging arm MD [place]"   And the video should appear in the video search results "Lymphedema Management: Self bandaging your arm" on YouTube   This video is for caregiver assistance:  https://www.youtube.com/watch?v=[email or web address]-JVdRIs  -OR-  Enter " Lymphedema Management: Caregiver Bandaging you arm" into your search browser and your video should appear in the results    PLEASE KEEP YOUR BANDAGES ON AS LONG AS POSSIBLE TO GET THE BEST SWELLING REDUCTION. Should your bandages become uncomfortable or feel too tight, follow these steps: 1. Elevate your extremity higher than your heart.  2. Try to move your arm or leg joints against the firmness of the bandage to help with moving the fluid and allow the bandages to loosen a bit.  3. If the bandaging is still is too tight, it is ok to carefully remove the top layer.  There will still be more layers under it that can provide compression to your extremity. 4. Finally, if you STILL have significant pain after trying these steps, it is ok to take the bandage off.  Check your skin carefully for any signs of irritation  5. PLEASE bring ALL bandage materials back to your next appointment as we will reuse what we can TAKE CARE OF YOUR BANDAGES SO THEY WILL LAST LONGER AND STAY IN BETTER CONDITION Washing bandages:  Wash periodically using a mild detergent in warm water.  Do not use fabric softener or bleach.  Place bandages in a mesh lingerie bag or in a tied off pillow case and use the gentle cycle of the washing machine or hand wash. If you hand wash, you may want to put them in the spin cycle of your washer to get the extra water out, but make sure you put them in a mesh bag first. Do not wring or stretch them while they are wet.  Drying bandages: Lay the bandages out smoothly on a towel away from  direct sunlight or heating sources that can damage the fabric. Rolling bandages in a towel and gently squeezing the towel to remove excess water before laying them out can speed up the process.  If you use a drying rack, place a towel on top of the rack to lay the bandages on.  If they hang down to dry, they fabric could be stretched out and the bandage will lose its compression.   Or, keep bandages in the mesh bag and dry them in the dryer on the low or no heat cycle. Rolling bandages: Please roll your bandages after drying them so they are ready for your next treatment. If they are rolled too loose, they will be difficult to apply.  If rolled too tight, they can get stretched out.   TAKE CARE OF YOUR SKIN 1. Apply a low pH moisturizing lotion to your skin daily 2. Avoid scratching your skin 3. Treat skin irritations quickly  4. Know the 5 warning signs of infection: redness, pain, warmth to touch, fever and increased swelling.  Call your physician immediately if you notice any of these signs of a possible infection.   

## 2018-12-03 ENCOUNTER — Other Ambulatory Visit: Payer: Self-pay

## 2018-12-03 ENCOUNTER — Ambulatory Visit (INDEPENDENT_AMBULATORY_CARE_PROVIDER_SITE_OTHER): Payer: Medicare PPO | Admitting: Licensed Clinical Social Worker

## 2018-12-03 ENCOUNTER — Encounter (HOSPITAL_COMMUNITY): Payer: Self-pay | Admitting: Licensed Clinical Social Worker

## 2018-12-03 DIAGNOSIS — F330 Major depressive disorder, recurrent, mild: Secondary | ICD-10-CM | POA: Diagnosis not present

## 2018-12-03 NOTE — Progress Notes (Signed)
Virtual Visit via Video Note  I connected with Alice Bowers on 12/03/18 at  8:00 AM EDT by a video enabled telemedicine application and verified that I am speaking with the correct person using two identifiers.   I discussed the limitations of evaluation and management by telemedicine and the availability of in person appointments. The patient expressed understanding and agreed to proceed.   Type of Therapy: Individual Therapy   Treatment Goals addressed: Diagnosis: MDD, bereavement   Interventions: CBT and Supportive   Summary: Alice Bowers is a 68 y.o. female who presents with MDD. mild  Suicidal/Homicidal: Nowithout intent/plan   Therapist Response: [name] met with clinician for an individual session. Alice Bowers discussed her psychiatric symptoms, her current life events and her goals for therapy. Alice Bowers shared that she has been doing okay, but having some frustration with her insurance, as well as some swelling in arms and leg. Clinician processed pain and swelling and discussed concerns as a byproduct of cancer treatment. Clinician explored emotional health and noted loneliness and some frustration. However, Alice Bowers reports she has been coping okay. She did report sleeping for the past couple of days much more than usual. Clinician noted that stress and poor health can result in increased exhaustion. Clinician encouraged [place] to maintain her routine as much as possible in order to maintain circadian rhythm.  Alice Bowers reports she will start radiation in 2 weeks. She has been appropriately social distancing and has maintained quarantine as much as possible due to her low immune system from cancer treatment.   Clinician reviewed treatment goal and continued.   Plan: Return again in 2 weeks.   Diagnosis: MDD, mild  I discussed the assessment and treatment plan with the patient. The patient was provided an opportunity to ask questions and all were answered. The patient agreed with the  plan and demonstrated an understanding of the instructions.   The patient was advised to call back or seek an in-person evaluation if the symptoms worsen or if the condition fails to improve as anticipated.  I provided 45 minutes of non-face-to-face time during this encounter.   [name], LCSW

## 2018-12-04 ENCOUNTER — Other Ambulatory Visit: Payer: Self-pay

## 2018-12-04 ENCOUNTER — Encounter: Payer: Self-pay | Admitting: Rehabilitation

## 2018-12-04 ENCOUNTER — Ambulatory Visit: Payer: Medicare PPO | Admitting: Rehabilitation

## 2018-12-04 DIAGNOSIS — C50911 Malignant neoplasm of unspecified site of right female breast: Secondary | ICD-10-CM

## 2018-12-04 DIAGNOSIS — M25611 Stiffness of right shoulder, not elsewhere classified: Secondary | ICD-10-CM

## 2018-12-04 DIAGNOSIS — I972 Postmastectomy lymphedema syndrome: Secondary | ICD-10-CM

## 2018-12-04 DIAGNOSIS — Z170 Estrogen receptor positive status [ER+]: Principal | ICD-10-CM

## 2018-12-04 NOTE — Therapy (Addendum)
[place], [place], [address] Phone: [phone]   Fax:  [phone]  Physical Therapy Treatment  Patient Details  Name: Alice Bowers MRN: [id number] Date of Birth: Dec 29, 1950 Referring Provider (PT): Dr. Lisbeth Renshaw   Encounter Date: 12/04/2018  PT End of Session - 12/04/18 1426    Visit Number  3    Number of Visits  9    Date for PT Re-Evaluation  12/20/18    Authorization Type  visits approved    PT Start Time  1004    PT Stop Time  1058    PT Time Calculation (min)  54 min    Activity Tolerance  Patient tolerated treatment well    Behavior During Therapy  [facility] for tasks assessed/performed       Past Medical History:  Diagnosis Date  . Anxiety   . Arthritis   . Depression   . Heart murmur   . High blood pressure   . High cholesterol   . Kidney stones   . Macular degeneration of left eye   . Macular degeneration of right eye   . Obstructive sleep apnea   . Restless leg syndrome     Past Surgical History:  Procedure Laterality Date  . ABDOMINAL HYSTERECTOMY    . APPENDECTOMY    . arthroscopy left shoulder    . CHOLECYSTECTOMY    . joint fusion bilateral thumbs    . MASTECTOMY Right 2019  . ORIF right ankle    . REPLACEMENT TOTAL KNEE BILATERAL      There were no vitals filed for this visit.  Subjective Assessment - 12/04/18 1001    Subjective  I think I was allergic to the cotton.  I remembered that I was told I was allergic to unfinished cotton about 40 years ago but have not had any reactions until now.  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 15/16 nodes positive. Chemotherapy complete radiation starting soon    Patient Stated Goals  get the swelling under control     Currently in Pain?  No/denies            LYMPHEDEMA/ONCOLOGY QUESTIONNAIRE - 12/04/18 1003      Right Upper Extremity Lymphedema   15 cm Proximal to Olecranon Process  44 cm    10 cm Proximal to Olecranon Process  42.5 cm    Olecranon Process  33 cm    15 cm Proximal to Ulnar Styloid Process  29.3 cm    10 cm Proximal to Ulnar Styloid Process  25.8 cm    Just Proximal to Ulnar Styloid Process  19 cm    Across Hand at [employer]  20.6 cm    At [place] of 2nd Digit  6.9 cm                [facility] Adult PT Treatment/Exercise - 12/04/18 0001      Manual Therapy   Manual Therapy  Manual Lymphatic Drainage (MLD)    Manual therapy comments  fashioned foam for the back of the hand for use in isotoner glove.  Gave pt new tg soft.     Edema Management  discussed options  for patient after measuring her UE which is about 0.5cm decreased in the UE from currentl self care.  Pt not wanting to wrap with rosidal, pt will get measured for a compression sleeve at [facility] as custom is most likely needed as pt is ok with the status of the arm. Still 2+ pitting in the back of the hand but size decreased    Manual Lymphatic Drainage (MLD)  education on self MLD in seated with education on the whole sequence, pathways and hand placement             PT Education - 12/04/18 1426    Education Details  self MLD    Person(s) Educated  Patient    Methods  Explanation;Demonstration;Verbal cues;Handout;Tactile cues    Comprehension  Tactile cues required;Verbalized understanding;Returned demonstration;Verbal cues required          PT Long Term Goals - 12/04/18 1429      PT LONG TERM GOAL #1   Title  Pt will be ind with self bandaging or velcro garment for reduction phase     Status  Achieved      PT LONG TERM GOAL #2   Title  Pt will be educated on lymphedema and risk reduction principles    Status  Achieved      PT  LONG TERM GOAL #3   Title  Pt will be knowledgeable about shoulder ROM and strengthening to prepare for radiation and after    Status  Partially Met      PT LONG TERM GOAL #4   Title  Pt will be educated on self MLD    Status  Achieved            Plan - 12/04/18 1427    Clinical Impression Statement  pt with skin irritation from bandaging reporting an unfinished cotton allergy that she never remembers.  Due to decrease in arm size and pt happy with the arm size pt will get measured for traditional garment.  Education on self MLD with good performance by pt and links for [employer]. Cancelled appts at this time and will check in with patient by phone.     PT Next Visit Plan  get measured?, how is MLD? talk about remedial exercises, check up as needed       Patient will benefit from skilled therapeutic intervention in order to improve the following deficits and impairments:     Visit Diagnosis: Malignant neoplasm of right breast in female, estrogen receptor positive, unspecified site of breast ([place])  Postmastectomy lymphedema  Stiffness of right shoulder, not elsewhere classified     Problem List Patient Active Problem List   Diagnosis Date Noted  . Malignant neoplasm of right breast in female, estrogen receptor positive ([place]) 11/15/2018  . Impaired fasting glucose 09/02/2015  . Obesity, Class III, BMI 40-49.9 (morbid obesity) ([place]) 05/14/2015  . Tobacco dependence 02/11/2015  . Heterozygous MTHFR mutation C677T ([place]) 10/13/2014  . Major depressive disorder, recurrent episode, moderate ([place]) 10/03/2014  . Recurrent major depressive disorder, in partial remission ([place]) 10/03/2014  . OSA on CPAP 02/13/2014  . Mixed hyperlipidemia 02/13/2014  . Osteoarthritis 02/13/2014  . Restless legs syndrome (RLS) 02/13/2014  . 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 History of kidney stones 02/13/2014  . Benign essential hypertension 02/13/2014  . Asthma 02/13/2014    Shan Levans, PT 12/04/2018, 2:31 PM  [place], [place], [address] Phone: [phone]  Fax:  [phone]  Name: Alice Bowers MRN: [id number] Date of Birth: 23-Feb-1951  PHYSICAL THERAPY DISCHARGE SUMMARY  Visits from Start of Care: 3  Current functional level related to goals / functional outcomes: Pt was taught self MLD and compression bandaging but did not tolerate bandaging well so she was going to get her own garments at [facility].  Called to follow up with patient a couple times but no return call.    Remaining deficits: unknown   Education / Equipment: Self MLD, bandaging Plan: Patient agrees to discharge.  Patient goals were partially met. Patient is being discharged due to not returning since the last visit.  ?????    Shan Levans, PT

## 2018-12-04 NOTE — Patient Instructions (Signed)
1.)Deep Effective Breath   Standing, sitting, or laying down, place both hands on the belly. Take a deep breath IN, expanding the belly; then breath OUT, contracting the belly. Repeat __5__ times. Do __2-3__ sessions per day and before your self massage.   Axilla to Axilla - Sweep   2.)On left side make 5 circles in the armpit with the right hand  3.)then pump _5__ times from left armpit across chest to the left armpit, making a pathway. Do _1__ time per day.   Axilla to Inguinal Nodes - Sweep   4.) On involved side, make 5 circles at groin on the right at panty line,  5.) then pump _5__ times from armpit along side of trunk to outer hip, making your other pathway. Do __1_ time per day.  Copyright  VHI. All rights reserved.  Arm Posterior: Elbow to Shoulder - Sweep   6.) Pump _5__ times from back of elbow to top of shoulder.   7.)Then inner to outer upper arm _5_ times,   Copyright  VHI. All rights reserved.  ARM: Volar Wrist to Elbow - Sweep   8.)Pump or stationary circles _5__ times from wrist to elbow making sure to do both sides of the forearm.  Copyright  VHI. All rights reserved.  ARM: Dorsum of Hand to Shoulder - Sweep   9.)Pump or stationary circles _5__ times on back of hand including knuckle spaces and individual fingers if needed working up towards the wrist,  10.)  then retrace all your steps working back up the forearm, doing both sides; upper outer arm and back to your pathways _2-3_ times each.   Then do 5 circles again at uninvolved armpit and involved groin where you started! Good job!!  Copyright  VHI. All rights reserved.        How to access self MLD videos:  Celine Ahr Training Videos:  [email or web address] (The videos are in the Details section of the page)  -OR-  1. Go to [email or web address]  2.  Scroll down until to you see:  [employer]uru Exclusive Web designer  Lymphedema Management Videos  Watch Free   3. Click on "Watch Free"  4. Scroll down until you see the instructional videos  5. Start with the "Self MLD Intro" video  6.  To watch the next appropriate video you will have to make an account on the website. There is a video for Right Arm, Left Arm, Right Leg, and Left Leg    MD Ouida Sills Video: (This video incorporates the back with a Neurosurgeon)  [email or web address]  -OR-  Enter "self MLD video MD Ouida Sills" into your search browser

## 2018-12-06 ENCOUNTER — Ambulatory Visit: Payer: Medicare PPO | Admitting: Rehabilitation

## 2018-12-09 ENCOUNTER — Encounter: Payer: Self-pay | Admitting: Radiation Oncology

## 2018-12-13 ENCOUNTER — Other Ambulatory Visit: Payer: Self-pay

## 2018-12-13 ENCOUNTER — Ambulatory Visit
Admission: RE | Admit: 2018-12-13 | Discharge: 2018-12-13 | Disposition: A | Discharge status: Home / Self Care | Payer: Medicare PPO | Source: Ambulatory Visit | Attending: Radiation Oncology | Admitting: Radiation Oncology

## 2018-12-13 DIAGNOSIS — Z9011 Acquired absence of right breast and nipple: Secondary | ICD-10-CM | POA: Diagnosis not present

## 2018-12-13 DIAGNOSIS — Z96653 Presence of artificial knee joint, bilateral: Secondary | ICD-10-CM | POA: Diagnosis not present

## 2018-12-13 DIAGNOSIS — E7800 Pure hypercholesterolemia, unspecified: Secondary | ICD-10-CM | POA: Diagnosis not present

## 2018-12-13 DIAGNOSIS — C50911 Malignant neoplasm of unspecified site of right female breast: Secondary | ICD-10-CM | POA: Diagnosis not present

## 2018-12-13 DIAGNOSIS — F329 Major depressive disorder, single episode, unspecified: Secondary | ICD-10-CM | POA: Diagnosis not present

## 2018-12-13 DIAGNOSIS — Z7952 Long term (current) use of systemic steroids: Secondary | ICD-10-CM | POA: Diagnosis not present

## 2018-12-13 DIAGNOSIS — Z9221 Personal history of antineoplastic chemotherapy: Secondary | ICD-10-CM | POA: Diagnosis not present

## 2018-12-13 DIAGNOSIS — F17210 Nicotine dependence, cigarettes, uncomplicated: Secondary | ICD-10-CM | POA: Diagnosis not present

## 2018-12-13 DIAGNOSIS — G2581 Restless legs syndrome: Secondary | ICD-10-CM | POA: Diagnosis not present

## 2018-12-13 DIAGNOSIS — Z170 Estrogen receptor positive status [ER+]: Secondary | ICD-10-CM | POA: Diagnosis not present

## 2018-12-13 DIAGNOSIS — Z79899 Other long term (current) drug therapy: Secondary | ICD-10-CM | POA: Diagnosis not present

## 2018-12-13 DIAGNOSIS — C50411 Malignant neoplasm of upper-outer quadrant of right female breast: Secondary | ICD-10-CM

## 2018-12-13 DIAGNOSIS — F419 Anxiety disorder, unspecified: Secondary | ICD-10-CM | POA: Diagnosis not present

## 2018-12-13 DIAGNOSIS — G4733 Obstructive sleep apnea (adult) (pediatric): Secondary | ICD-10-CM | POA: Diagnosis not present

## 2018-12-13 DIAGNOSIS — Z7951 Long term (current) use of inhaled steroids: Secondary | ICD-10-CM | POA: Diagnosis not present

## 2018-12-13 DIAGNOSIS — Z510 Encounter for antineoplastic radiation therapy: Secondary | ICD-10-CM | POA: Diagnosis not present

## 2018-12-17 ENCOUNTER — Ambulatory Visit (INDEPENDENT_AMBULATORY_CARE_PROVIDER_SITE_OTHER): Payer: Medicare PPO | Admitting: Licensed Clinical Social Worker

## 2018-12-17 ENCOUNTER — Encounter (HOSPITAL_COMMUNITY): Payer: Self-pay | Admitting: Licensed Clinical Social Worker

## 2018-12-17 ENCOUNTER — Other Ambulatory Visit: Payer: Self-pay

## 2018-12-17 DIAGNOSIS — F330 Major depressive disorder, recurrent, mild: Secondary | ICD-10-CM | POA: Diagnosis not present

## 2018-12-17 NOTE — Progress Notes (Signed)
Virtual Visit via Video Note  I connected with Alice Bowers on 12/17/18 at  8:00 AM EDT by a video enabled telemedicine application and verified that I am speaking with the correct person using two identifiers.  Location: Patient: 95 Provider: 95   I discussed the limitations of evaluation and management by telemedicine and the availability of in person appointments. The patient expressed understanding and agreed to proceed.  Type of Therapy: Individual Therapy   Treatment Goals addressed: Diagnosis: MDD  Interventions: CBT and Supportive   Summary: Angelica Frandsen is a 68 y.o. female who presents with MDD   Suicidal/Homicidal: Nowithout intent/plan   Therapist Response: [name] met with clinician for an individual session. Alicja discussed her psychiatric symptoms, her current life events and her goals for therapy. Neelah shared that she feels frustrated that she would not be starting her radiation until this week. She reported looking forward to moving ahead with this treatment at the 4th week. However, she reported that she was able to advocate for herself with the nurse and express her feelings. Clinician processed thoughts and feelings using MI reflections. Clinician also noted that due to her previous experiences dealing with cancer treatments for her husband and her daughter, she knows what to be vocal about. Clinician noted increased anxiety about starting the treatments especially since her husband died so shortly after his cancer diagnosis. Zoei reported that the nurse said she is cancer-free at this time since the surgery and chemo have been reported that have killed any remaining cancer cells. However, the radiation is to avoid the possibility that the cancer would come back. This helped Sindia feel better. She will start radiation this Thursday.  Clinician explored socialization during this time. Trinaty reported she has had some social distance visits with the neighbors  through the fence and that she saw her granddaughter practice her motorbike over the weekend. She reported this was really good for her to reconnect with her family.   Plan: Return again in 2 weeks.   Diagnosis: MDD    I discussed the assessment and treatment plan with the patient. The patient was provided an opportunity to ask questions and all were answered. The patient agreed with the plan and demonstrated an understanding of the instructions.   The patient was advised to call back or seek an in-person evaluation if the symptoms worsen or if the condition fails to improve as anticipated.  I provided 50 minutes of non-face-to-face time during this encounter.   [name], LCSW

## 2018-12-18 ENCOUNTER — Ambulatory Visit: Payer: Medicare PPO | Admitting: Radiation Oncology

## 2018-12-18 ENCOUNTER — Telehealth: Payer: Self-pay | Admitting: Rehabilitation

## 2018-12-18 NOTE — Telephone Encounter (Signed)
Called to check in with patient regarding lymphedema.  Pt reports she was not able to get scheduled with anyone in [place] due to COVID-19 for garments.  Reminded her of [facility] here in [place] and pt will call them as instructed.  Denied any need for more PT treatment right now as she is starting radiation Thursday.  Encouraged her to call us with any lymphedema, shoulder, or mobility challenges throughout treatment.

## 2018-12-20 ENCOUNTER — Other Ambulatory Visit: Payer: Self-pay

## 2018-12-20 ENCOUNTER — Ambulatory Visit
Admission: RE | Admit: 2018-12-20 | Discharge: 2018-12-20 | Disposition: A | Discharge status: Home / Self Care | Payer: Medicare PPO | Source: Ambulatory Visit | Attending: Radiation Oncology | Admitting: Radiation Oncology

## 2018-12-20 DIAGNOSIS — C50911 Malignant neoplasm of unspecified site of right female breast: Secondary | ICD-10-CM | POA: Insufficient documentation

## 2018-12-20 DIAGNOSIS — Z7951 Long term (current) use of inhaled steroids: Secondary | ICD-10-CM | POA: Diagnosis not present

## 2018-12-20 DIAGNOSIS — Z9221 Personal history of antineoplastic chemotherapy: Secondary | ICD-10-CM | POA: Insufficient documentation

## 2018-12-20 DIAGNOSIS — G4733 Obstructive sleep apnea (adult) (pediatric): Secondary | ICD-10-CM | POA: Insufficient documentation

## 2018-12-20 DIAGNOSIS — F329 Major depressive disorder, single episode, unspecified: Secondary | ICD-10-CM | POA: Diagnosis not present

## 2018-12-20 DIAGNOSIS — Z7952 Long term (current) use of systemic steroids: Secondary | ICD-10-CM | POA: Diagnosis not present

## 2018-12-20 DIAGNOSIS — Z79899 Other long term (current) drug therapy: Secondary | ICD-10-CM | POA: Insufficient documentation

## 2018-12-20 DIAGNOSIS — G2581 Restless legs syndrome: Secondary | ICD-10-CM | POA: Diagnosis not present

## 2018-12-20 DIAGNOSIS — Z96653 Presence of artificial knee joint, bilateral: Secondary | ICD-10-CM | POA: Diagnosis not present

## 2018-12-20 DIAGNOSIS — F419 Anxiety disorder, unspecified: Secondary | ICD-10-CM | POA: Diagnosis not present

## 2018-12-20 DIAGNOSIS — Z510 Encounter for antineoplastic radiation therapy: Secondary | ICD-10-CM | POA: Insufficient documentation

## 2018-12-20 DIAGNOSIS — F17210 Nicotine dependence, cigarettes, uncomplicated: Secondary | ICD-10-CM | POA: Diagnosis not present

## 2018-12-20 DIAGNOSIS — E7800 Pure hypercholesterolemia, unspecified: Secondary | ICD-10-CM | POA: Insufficient documentation

## 2018-12-20 DIAGNOSIS — Z170 Estrogen receptor positive status [ER+]: Secondary | ICD-10-CM | POA: Diagnosis not present

## 2018-12-20 DIAGNOSIS — Z9011 Acquired absence of right breast and nipple: Secondary | ICD-10-CM | POA: Insufficient documentation

## 2018-12-21 ENCOUNTER — Ambulatory Visit
Admission: RE | Admit: 2018-12-21 | Discharge: 2018-12-21 | Disposition: A | Discharge status: Home / Self Care | Payer: Medicare PPO | Source: Ambulatory Visit | Attending: Radiation Oncology | Admitting: Radiation Oncology

## 2018-12-21 ENCOUNTER — Other Ambulatory Visit: Payer: Self-pay

## 2018-12-21 DIAGNOSIS — C50911 Malignant neoplasm of unspecified site of right female breast: Secondary | ICD-10-CM

## 2018-12-21 DIAGNOSIS — Z510 Encounter for antineoplastic radiation therapy: Secondary | ICD-10-CM | POA: Diagnosis not present

## 2018-12-21 MED ORDER — ALRA NON-METALLIC DEODORANT (RAD-ONC)
1.0000 "application " | Freq: Once | TOPICAL | Status: AC
  Administered 2018-12-21: 1 via TOPICAL

## 2018-12-21 MED ORDER — SONAFINE EX EMUL
1.0000 "application " | Freq: Two times a day (BID) | CUTANEOUS | Status: DC
  Administered 2018-12-21: 1 via TOPICAL

## 2018-12-24 ENCOUNTER — Other Ambulatory Visit: Payer: Self-pay

## 2018-12-24 ENCOUNTER — Ambulatory Visit
Admission: RE | Admit: 2018-12-24 | Discharge: 2018-12-24 | Disposition: A | Discharge status: Home / Self Care | Payer: Medicare PPO | Source: Ambulatory Visit | Attending: Radiation Oncology | Admitting: Radiation Oncology

## 2018-12-24 DIAGNOSIS — Z510 Encounter for antineoplastic radiation therapy: Secondary | ICD-10-CM | POA: Diagnosis not present

## 2018-12-25 ENCOUNTER — Ambulatory Visit
Admission: RE | Admit: 2018-12-25 | Discharge: 2018-12-25 | Disposition: A | Discharge status: Home / Self Care | Payer: Medicare PPO | Source: Ambulatory Visit | Attending: Radiation Oncology | Admitting: Radiation Oncology

## 2018-12-25 ENCOUNTER — Other Ambulatory Visit: Payer: Self-pay

## 2018-12-25 DIAGNOSIS — Z510 Encounter for antineoplastic radiation therapy: Secondary | ICD-10-CM | POA: Diagnosis not present

## 2018-12-26 ENCOUNTER — Other Ambulatory Visit: Payer: Self-pay

## 2018-12-26 ENCOUNTER — Encounter: Payer: Self-pay | Admitting: *Deleted

## 2018-12-26 ENCOUNTER — Ambulatory Visit
Admission: RE | Admit: 2018-12-26 | Discharge: 2018-12-26 | Disposition: A | Discharge status: Home / Self Care | Payer: Medicare PPO | Source: Ambulatory Visit | Attending: Radiation Oncology | Admitting: Radiation Oncology

## 2018-12-26 DIAGNOSIS — Z510 Encounter for antineoplastic radiation therapy: Secondary | ICD-10-CM | POA: Diagnosis not present

## 2018-12-27 ENCOUNTER — Ambulatory Visit
Admission: RE | Admit: 2018-12-27 | Discharge: 2018-12-27 | Disposition: A | Discharge status: Home / Self Care | Payer: Medicare PPO | Source: Ambulatory Visit | Attending: Radiation Oncology | Admitting: Radiation Oncology

## 2018-12-27 ENCOUNTER — Other Ambulatory Visit: Payer: Self-pay

## 2018-12-27 DIAGNOSIS — Z510 Encounter for antineoplastic radiation therapy: Secondary | ICD-10-CM | POA: Diagnosis not present

## 2018-12-28 ENCOUNTER — Other Ambulatory Visit: Payer: Self-pay

## 2018-12-28 ENCOUNTER — Ambulatory Visit
Admission: RE | Admit: 2018-12-28 | Discharge: 2018-12-28 | Disposition: A | Discharge status: Home / Self Care | Payer: Medicare PPO | Source: Ambulatory Visit | Attending: Radiation Oncology | Admitting: Radiation Oncology

## 2018-12-28 DIAGNOSIS — Z510 Encounter for antineoplastic radiation therapy: Secondary | ICD-10-CM | POA: Diagnosis not present

## 2018-12-31 ENCOUNTER — Other Ambulatory Visit: Payer: Self-pay

## 2018-12-31 ENCOUNTER — Ambulatory Visit
Admission: RE | Admit: 2018-12-31 | Discharge: 2018-12-31 | Disposition: A | Discharge status: Home / Self Care | Payer: Medicare PPO | Source: Ambulatory Visit | Attending: Radiation Oncology | Admitting: Radiation Oncology

## 2018-12-31 DIAGNOSIS — Z510 Encounter for antineoplastic radiation therapy: Secondary | ICD-10-CM | POA: Diagnosis not present

## 2019-01-01 ENCOUNTER — Ambulatory Visit
Admission: RE | Admit: 2019-01-01 | Discharge: 2019-01-01 | Disposition: A | Discharge status: Home / Self Care | Payer: Self-pay | Source: Ambulatory Visit | Attending: Radiation Oncology | Admitting: Radiation Oncology

## 2019-01-01 ENCOUNTER — Ambulatory Visit (INDEPENDENT_AMBULATORY_CARE_PROVIDER_SITE_OTHER): Payer: Medicare PPO | Admitting: Licensed Clinical Social Worker

## 2019-01-01 ENCOUNTER — Other Ambulatory Visit: Payer: Self-pay

## 2019-01-01 ENCOUNTER — Ambulatory Visit
Admission: RE | Admit: 2019-01-01 | Discharge: 2019-01-01 | Disposition: A | Discharge status: Home / Self Care | Payer: Medicare PPO | Source: Ambulatory Visit | Attending: Radiation Oncology | Admitting: Radiation Oncology

## 2019-01-01 ENCOUNTER — Encounter (HOSPITAL_COMMUNITY): Payer: Self-pay | Admitting: Licensed Clinical Social Worker

## 2019-01-01 ENCOUNTER — Other Ambulatory Visit: Payer: Self-pay | Admitting: Radiation Oncology

## 2019-01-01 DIAGNOSIS — F330 Major depressive disorder, recurrent, mild: Secondary | ICD-10-CM

## 2019-01-01 DIAGNOSIS — C50919 Malignant neoplasm of unspecified site of unspecified female breast: Secondary | ICD-10-CM

## 2019-01-01 DIAGNOSIS — Z510 Encounter for antineoplastic radiation therapy: Secondary | ICD-10-CM | POA: Diagnosis not present

## 2019-01-01 NOTE — Progress Notes (Signed)
Virtual Visit via Video Note  I connected with Alice Bowers on 01/01/19 at  8:00 AM EDT by a video enabled telemedicine application and verified that I am speaking with the correct person using two identifiers.    I discussed the limitations of evaluation and management by telemedicine and the availability of in person appointments. The patient expressed understanding and agreed to proceed.   Type of Therapy: Individual Therapy   Treatment Goals addressed: Diagnosis: MDD, bereavement   Interventions: CBT and Supportive   Summary: Alice Bowers is a 68 y.o. female who presents with MDD    Suicidal/Homicidal: Nowithout intent/plan   Therapist Response: Alice Bowers met with clinician for an individual session. Alice Bowers discussed her psychiatric symptoms, her current life events and her goals for therapy. Alice Bowers shared that she has begun her radiation at [facility] and so far things are going okay. She reports having to come daily for a few weeks in order to complete this treatment. Clinician processed coping with the treatment, including any side effects. Clinician also explored mood and loneliness/depression with social distancing. Alice Bowers identified some small interactions with family members here and there, but feeling lonely and somewhat frustrated with sitting around the house all alone. Clinician discussed ways to engage with friends and family members using video conferencing, such as facetime or Medical laboratory scientific officer. Clinician also discussed the value of maintaining a good attitude, as this has really carried her through the previous stage of treatment.   Plan: Return again in 2 weeks.   Diagnosis: MDD   I discussed the assessment and treatment plan with the patient. The patient was provided an opportunity to ask questions and all were answered. The patient agreed with the plan and demonstrated an understanding of the instructions.   The patient was advised to call back or seek  an in-person evaluation if the symptoms worsen or if the condition fails to improve as anticipated.  I provided 45 minutes of non-face-to-face time during this encounter.   [name], LCSW

## 2019-01-01 NOTE — Progress Notes (Signed)
  Radiation Oncology         [phone]) [phone] ________________________________  Name: Jolyssa Oplinger MRN: [id number]  Date: 12/13/2018  DOB: 1951/04/24  Optical Surface Tracking Plan:  Since intensity modulated radiotherapy (IMRT) and 3D conformal radiation treatment methods are predicated on accurate and precise positioning for treatment, intrafraction motion monitoring is medically necessary to ensure accurate and safe treatment delivery.  The ability to quantify intrafraction motion without excessive ionizing radiation dose can only be performed with optical surface tracking. Accordingly, surface imaging offers the opportunity to obtain 3D measurements of patient position throughout IMRT and 3D treatments without excessive radiation exposure.  I am ordering optical surface tracking for this patient's upcoming course of radiotherapy. ________________________________  Kyung Rudd, MD 01/01/2019 3:46 PM    Reference:   Ursula Alert, J, et al. Surface imaging-based analysis of intrafraction motion for breast radiotherapy patients.Journal of [place], n. 6, nov. 2014. ISSN [id number].   Available at: <http://www.jacmp.org/index.php/jacmp/article/view/4957>.

## 2019-01-01 NOTE — Progress Notes (Signed)
  Radiation Oncology         [phone]) [phone] ________________________________  Name: Alice Bowers MRN: [id number]  Date: 12/13/2018  DOB: 12-07-1950  DIAGNOSIS:     ICD-10-CM   1. Malignant neoplasm of upper-outer quadrant of right female breast, unspecified estrogen receptor status ([place]) C50.411      SIMULATION AND TREATMENT PLANNING NOTE  The patient presented for simulation prior to beginning her course of radiation treatment for her diagnosis of right-sided breast cancer. The patient was placed in a supine position on a breast board. A customized vac-lock bag was also constructed and this complex treatment device will be used on a daily basis during her treatment. In this fashion, a CT scan was obtained through the chest area and an isocenter was placed near the chest wall at the upper aspect of the right chest.  The patient will be planned to receive a course of radiation initially to a dose of 50.4 gray. This will consist of a 4 field technique targeting the right chest wall as well as the supraclavicular region. Therefore 2 customized medial and lateral tangent fields have been created targeting the chest wall, and also 2 additional customized fields have been designed to treat the supraclavicular region both with a right supraclavicular field and a right posterior axillary boost field. A forward planning/reduced field technique will also be evaluated to determine if this significantly improves the dose homogeneity of the overall plan. Therefore, additional customized blocks/fields may be necessary.  This initial treatment will be accomplished at 1.8 gray per fraction.   The initial plan will consist of a 3-D conformal technique. The target volume/scar, heart and lungs have been contoured and dose volume histograms of each of these structures will be evaluated as part of the 3-D conformal treatment planning process.   It is anticipated that the patient will then receive a 10 gray boost to  the surgical scar. This will be accomplished at 2 gray per fraction. The final anticipated total dose therefore will correspond to 60.4 gray.    _______________________________   Jodelle Gross, MD, PhD

## 2019-01-02 ENCOUNTER — Other Ambulatory Visit: Payer: Self-pay

## 2019-01-02 ENCOUNTER — Ambulatory Visit
Admission: RE | Admit: 2019-01-02 | Discharge: 2019-01-02 | Disposition: A | Discharge status: Home / Self Care | Payer: Medicare PPO | Source: Ambulatory Visit | Attending: Radiation Oncology | Admitting: Radiation Oncology

## 2019-01-02 DIAGNOSIS — Z510 Encounter for antineoplastic radiation therapy: Secondary | ICD-10-CM | POA: Diagnosis not present

## 2019-01-03 ENCOUNTER — Ambulatory Visit
Admission: RE | Admit: 2019-01-03 | Discharge: 2019-01-03 | Disposition: A | Discharge status: Home / Self Care | Payer: Medicare PPO | Source: Ambulatory Visit | Attending: Radiation Oncology | Admitting: Radiation Oncology

## 2019-01-03 ENCOUNTER — Other Ambulatory Visit: Payer: Self-pay

## 2019-01-03 DIAGNOSIS — Z510 Encounter for antineoplastic radiation therapy: Secondary | ICD-10-CM | POA: Diagnosis not present

## 2019-01-04 ENCOUNTER — Other Ambulatory Visit: Payer: Self-pay

## 2019-01-04 ENCOUNTER — Ambulatory Visit
Admission: RE | Admit: 2019-01-04 | Discharge: 2019-01-04 | Disposition: A | Discharge status: Home / Self Care | Payer: Medicare PPO | Source: Ambulatory Visit | Attending: Radiation Oncology | Admitting: Radiation Oncology

## 2019-01-04 DIAGNOSIS — Z510 Encounter for antineoplastic radiation therapy: Secondary | ICD-10-CM | POA: Diagnosis not present

## 2019-01-08 ENCOUNTER — Ambulatory Visit
Admission: RE | Admit: 2019-01-08 | Discharge: 2019-01-08 | Disposition: A | Discharge status: Home / Self Care | Payer: Medicare PPO | Source: Ambulatory Visit | Attending: Radiation Oncology | Admitting: Radiation Oncology

## 2019-01-08 ENCOUNTER — Other Ambulatory Visit: Payer: Self-pay

## 2019-01-08 DIAGNOSIS — Z510 Encounter for antineoplastic radiation therapy: Secondary | ICD-10-CM | POA: Diagnosis not present

## 2019-01-09 ENCOUNTER — Ambulatory Visit
Admission: RE | Admit: 2019-01-09 | Discharge: 2019-01-09 | Disposition: A | Discharge status: Home / Self Care | Payer: Medicare PPO | Source: Ambulatory Visit | Attending: Radiation Oncology | Admitting: Radiation Oncology

## 2019-01-09 DIAGNOSIS — Z510 Encounter for antineoplastic radiation therapy: Secondary | ICD-10-CM | POA: Diagnosis not present

## 2019-01-10 ENCOUNTER — Telehealth: Payer: Self-pay | Admitting: Rehabilitation

## 2019-01-10 ENCOUNTER — Ambulatory Visit
Admission: RE | Admit: 2019-01-10 | Discharge: 2019-01-10 | Disposition: A | Discharge status: Home / Self Care | Payer: Medicare PPO | Source: Ambulatory Visit | Attending: Radiation Oncology | Admitting: Radiation Oncology

## 2019-01-10 ENCOUNTER — Other Ambulatory Visit: Payer: Self-pay

## 2019-01-10 DIAGNOSIS — Z510 Encounter for antineoplastic radiation therapy: Secondary | ICD-10-CM | POA: Diagnosis not present

## 2019-01-10 NOTE — Telephone Encounter (Signed)
LM to let pt know we were checking in about her swelling and to see if she received her garments.

## 2019-01-11 ENCOUNTER — Other Ambulatory Visit: Payer: Self-pay

## 2019-01-11 ENCOUNTER — Ambulatory Visit
Admission: RE | Admit: 2019-01-11 | Discharge: 2019-01-11 | Disposition: A | Discharge status: Home / Self Care | Payer: Medicare PPO | Source: Ambulatory Visit | Attending: Radiation Oncology | Admitting: Radiation Oncology

## 2019-01-11 DIAGNOSIS — Z510 Encounter for antineoplastic radiation therapy: Secondary | ICD-10-CM | POA: Diagnosis not present

## 2019-01-14 ENCOUNTER — Other Ambulatory Visit: Payer: Self-pay

## 2019-01-14 ENCOUNTER — Ambulatory Visit
Admission: RE | Admit: 2019-01-14 | Discharge: 2019-01-14 | Disposition: A | Discharge status: Home / Self Care | Payer: Medicare PPO | Source: Ambulatory Visit | Attending: Radiation Oncology | Admitting: Radiation Oncology

## 2019-01-14 DIAGNOSIS — E7800 Pure hypercholesterolemia, unspecified: Secondary | ICD-10-CM | POA: Insufficient documentation

## 2019-01-14 DIAGNOSIS — F17210 Nicotine dependence, cigarettes, uncomplicated: Secondary | ICD-10-CM | POA: Diagnosis not present

## 2019-01-14 DIAGNOSIS — Z510 Encounter for antineoplastic radiation therapy: Secondary | ICD-10-CM | POA: Insufficient documentation

## 2019-01-14 DIAGNOSIS — Z9011 Acquired absence of right breast and nipple: Secondary | ICD-10-CM | POA: Diagnosis not present

## 2019-01-14 DIAGNOSIS — Z9221 Personal history of antineoplastic chemotherapy: Secondary | ICD-10-CM | POA: Insufficient documentation

## 2019-01-14 DIAGNOSIS — Z7952 Long term (current) use of systemic steroids: Secondary | ICD-10-CM | POA: Diagnosis not present

## 2019-01-14 DIAGNOSIS — G4733 Obstructive sleep apnea (adult) (pediatric): Secondary | ICD-10-CM | POA: Diagnosis not present

## 2019-01-14 DIAGNOSIS — F419 Anxiety disorder, unspecified: Secondary | ICD-10-CM | POA: Diagnosis not present

## 2019-01-14 DIAGNOSIS — G2581 Restless legs syndrome: Secondary | ICD-10-CM | POA: Insufficient documentation

## 2019-01-14 DIAGNOSIS — Z7951 Long term (current) use of inhaled steroids: Secondary | ICD-10-CM | POA: Diagnosis not present

## 2019-01-14 DIAGNOSIS — Z79899 Other long term (current) drug therapy: Secondary | ICD-10-CM | POA: Diagnosis not present

## 2019-01-14 DIAGNOSIS — F329 Major depressive disorder, single episode, unspecified: Secondary | ICD-10-CM | POA: Diagnosis not present

## 2019-01-14 DIAGNOSIS — Z170 Estrogen receptor positive status [ER+]: Secondary | ICD-10-CM | POA: Insufficient documentation

## 2019-01-14 DIAGNOSIS — C50911 Malignant neoplasm of unspecified site of right female breast: Secondary | ICD-10-CM | POA: Diagnosis not present

## 2019-01-14 DIAGNOSIS — Z96653 Presence of artificial knee joint, bilateral: Secondary | ICD-10-CM | POA: Insufficient documentation

## 2019-01-15 ENCOUNTER — Ambulatory Visit
Admission: RE | Admit: 2019-01-15 | Discharge: 2019-01-15 | Disposition: A | Discharge status: Home / Self Care | Payer: Medicare PPO | Source: Ambulatory Visit | Attending: Radiation Oncology | Admitting: Radiation Oncology

## 2019-01-15 ENCOUNTER — Other Ambulatory Visit: Payer: Self-pay

## 2019-01-15 DIAGNOSIS — Z510 Encounter for antineoplastic radiation therapy: Secondary | ICD-10-CM | POA: Diagnosis not present

## 2019-01-16 ENCOUNTER — Ambulatory Visit
Admission: RE | Admit: 2019-01-16 | Discharge: 2019-01-16 | Disposition: A | Discharge status: Home / Self Care | Payer: Medicare PPO | Source: Ambulatory Visit | Attending: Radiation Oncology | Admitting: Radiation Oncology

## 2019-01-16 ENCOUNTER — Other Ambulatory Visit: Payer: Self-pay

## 2019-01-16 DIAGNOSIS — Z510 Encounter for antineoplastic radiation therapy: Secondary | ICD-10-CM | POA: Diagnosis not present

## 2019-01-17 ENCOUNTER — Ambulatory Visit
Admission: RE | Admit: 2019-01-17 | Discharge: 2019-01-17 | Disposition: A | Discharge status: Home / Self Care | Payer: Medicare PPO | Source: Ambulatory Visit | Attending: Radiation Oncology | Admitting: Radiation Oncology

## 2019-01-17 ENCOUNTER — Ambulatory Visit (HOSPITAL_COMMUNITY): Payer: Medicare PPO | Admitting: Licensed Clinical Social Worker

## 2019-01-17 ENCOUNTER — Other Ambulatory Visit: Payer: Self-pay

## 2019-01-17 DIAGNOSIS — Z510 Encounter for antineoplastic radiation therapy: Secondary | ICD-10-CM | POA: Diagnosis not present

## 2019-01-18 ENCOUNTER — Other Ambulatory Visit: Payer: Self-pay

## 2019-01-18 ENCOUNTER — Ambulatory Visit
Admission: RE | Admit: 2019-01-18 | Discharge: 2019-01-18 | Disposition: A | Discharge status: Home / Self Care | Payer: Medicare PPO | Source: Ambulatory Visit | Attending: Radiation Oncology | Admitting: Radiation Oncology

## 2019-01-18 DIAGNOSIS — Z510 Encounter for antineoplastic radiation therapy: Secondary | ICD-10-CM | POA: Diagnosis not present

## 2019-01-21 ENCOUNTER — Other Ambulatory Visit: Payer: Self-pay

## 2019-01-21 ENCOUNTER — Ambulatory Visit
Admission: RE | Admit: 2019-01-21 | Discharge: 2019-01-21 | Disposition: A | Discharge status: Home / Self Care | Payer: Medicare PPO | Source: Ambulatory Visit | Attending: Radiation Oncology | Admitting: Radiation Oncology

## 2019-01-21 DIAGNOSIS — Z510 Encounter for antineoplastic radiation therapy: Secondary | ICD-10-CM | POA: Diagnosis not present

## 2019-01-22 ENCOUNTER — Ambulatory Visit
Admission: RE | Admit: 2019-01-22 | Discharge: 2019-01-22 | Disposition: A | Discharge status: Home / Self Care | Payer: Medicare PPO | Source: Ambulatory Visit | Attending: Radiation Oncology | Admitting: Radiation Oncology

## 2019-01-22 ENCOUNTER — Other Ambulatory Visit: Payer: Self-pay

## 2019-01-22 DIAGNOSIS — Z510 Encounter for antineoplastic radiation therapy: Secondary | ICD-10-CM | POA: Diagnosis not present

## 2019-01-23 ENCOUNTER — Ambulatory Visit (INDEPENDENT_AMBULATORY_CARE_PROVIDER_SITE_OTHER): Payer: Medicare PPO | Admitting: Licensed Clinical Social Worker

## 2019-01-23 ENCOUNTER — Ambulatory Visit
Admission: RE | Admit: 2019-01-23 | Discharge: 2019-01-23 | Disposition: A | Discharge status: Home / Self Care | Payer: Medicare PPO | Source: Ambulatory Visit | Attending: Radiation Oncology | Admitting: Radiation Oncology

## 2019-01-23 ENCOUNTER — Encounter (HOSPITAL_COMMUNITY): Payer: Self-pay | Admitting: Licensed Clinical Social Worker

## 2019-01-23 ENCOUNTER — Other Ambulatory Visit: Payer: Self-pay

## 2019-01-23 DIAGNOSIS — F330 Major depressive disorder, recurrent, mild: Secondary | ICD-10-CM | POA: Diagnosis not present

## 2019-01-23 DIAGNOSIS — Z510 Encounter for antineoplastic radiation therapy: Secondary | ICD-10-CM | POA: Diagnosis not present

## 2019-01-23 NOTE — Progress Notes (Signed)
Virtual Visit via Video Note  I connected with Alice Bowers on 01/23/19 at  8:00 AM EDT by a video enabled telemedicine application and verified that I am speaking with the correct person using two identifiers.    I discussed the limitations of evaluation and management by telemedicine and the availability of in person appointments. The patient expressed understanding and agreed to proceed.  Type of Therapy: Individual Therapy   Treatment Goals addressed: Diagnosis: MDD, bereavement   Interventions: CBT and Supportive   Summary: Alice Bowers is a 68 y.o. female who presents with MDD   Suicidal/Homicidal: Nowithout intent/plan   Therapist Response: [name] met with clinician for an individual session. Alice Bowers discussed her psychiatric symptoms, her current life events and her goals for therapy. Alice Bowers shared that she has about 2 weeks left of her radiation therapy for breast cancer and then she will be done. She reports feeling excited and relieved that it will soon be over, but also exhausted from all the treatments she has undergone over the past 9 months. She reports the radiation is going fine, other than a "sunburn" on her arm and chest and feeling very tired. Clinician explored interactions and activity levels over the past two weeks. Alice Bowers identified one trip to see her granddaughter race, which was really good for her. 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 Clinician reflected Alice Bowers's need to make more money, especially due to mounting medical bills. Alice Bowers reported concern about being able to afford to pay for her Wellbutrin. Clinician provided info about [facility] and found lower prices for her meds.   Plan: Return again in 1-2 weeks.   Diagnosis: MDD     I discussed the assessment and treatment plan with the patient. The patient was provided an opportunity to ask questions and all were answered. The patient  agreed with the plan and demonstrated an understanding of the instructions.   The patient was advised to call back or seek an in-person evaluation if the symptoms worsen or if the condition fails to improve as anticipated.  I provided 55 minutes of non-face-to-face time during this encounter.   [name], LCSW

## 2019-01-24 ENCOUNTER — Ambulatory Visit
Admission: RE | Admit: 2019-01-24 | Discharge: 2019-01-24 | Disposition: A | Discharge status: Home / Self Care | Payer: Medicare PPO | Source: Ambulatory Visit | Attending: Radiation Oncology | Admitting: Radiation Oncology

## 2019-01-24 ENCOUNTER — Other Ambulatory Visit: Payer: Self-pay

## 2019-01-24 DIAGNOSIS — Z510 Encounter for antineoplastic radiation therapy: Secondary | ICD-10-CM | POA: Diagnosis not present

## 2019-01-25 ENCOUNTER — Other Ambulatory Visit: Payer: Self-pay

## 2019-01-25 ENCOUNTER — Ambulatory Visit
Admission: RE | Admit: 2019-01-25 | Discharge: 2019-01-25 | Disposition: A | Discharge status: Home / Self Care | Payer: Medicare PPO | Source: Ambulatory Visit | Attending: Radiation Oncology | Admitting: Radiation Oncology

## 2019-01-25 DIAGNOSIS — Z510 Encounter for antineoplastic radiation therapy: Secondary | ICD-10-CM | POA: Diagnosis not present

## 2019-01-28 ENCOUNTER — Other Ambulatory Visit: Payer: Self-pay

## 2019-01-28 ENCOUNTER — Ambulatory Visit
Admission: RE | Admit: 2019-01-28 | Discharge: 2019-01-28 | Disposition: A | Discharge status: Home / Self Care | Payer: Medicare PPO | Source: Ambulatory Visit | Attending: Radiation Oncology | Admitting: Radiation Oncology

## 2019-01-28 DIAGNOSIS — Z510 Encounter for antineoplastic radiation therapy: Secondary | ICD-10-CM | POA: Diagnosis not present

## 2019-01-29 ENCOUNTER — Other Ambulatory Visit: Payer: Self-pay

## 2019-01-29 ENCOUNTER — Ambulatory Visit
Admission: RE | Admit: 2019-01-29 | Discharge: 2019-01-29 | Disposition: A | Discharge status: Home / Self Care | Payer: Medicare PPO | Source: Ambulatory Visit | Attending: Radiation Oncology | Admitting: Radiation Oncology

## 2019-01-29 DIAGNOSIS — Z510 Encounter for antineoplastic radiation therapy: Secondary | ICD-10-CM | POA: Diagnosis not present

## 2019-01-30 ENCOUNTER — Other Ambulatory Visit: Payer: Self-pay

## 2019-01-30 ENCOUNTER — Ambulatory Visit
Admission: RE | Admit: 2019-01-30 | Discharge: 2019-01-30 | Disposition: A | Discharge status: Home / Self Care | Payer: Medicare PPO | Source: Ambulatory Visit | Attending: Radiation Oncology | Admitting: Radiation Oncology

## 2019-01-30 DIAGNOSIS — Z510 Encounter for antineoplastic radiation therapy: Secondary | ICD-10-CM | POA: Diagnosis not present

## 2019-01-31 ENCOUNTER — Encounter (HOSPITAL_COMMUNITY): Payer: Self-pay | Admitting: Licensed Clinical Social Worker

## 2019-01-31 ENCOUNTER — Ambulatory Visit
Admission: RE | Admit: 2019-01-31 | Discharge: 2019-01-31 | Disposition: A | Discharge status: Home / Self Care | Payer: Medicare PPO | Source: Ambulatory Visit | Attending: Radiation Oncology | Admitting: Radiation Oncology

## 2019-01-31 ENCOUNTER — Ambulatory Visit (INDEPENDENT_AMBULATORY_CARE_PROVIDER_SITE_OTHER): Payer: Medicare PPO | Admitting: Licensed Clinical Social Worker

## 2019-01-31 ENCOUNTER — Other Ambulatory Visit: Payer: Self-pay

## 2019-01-31 DIAGNOSIS — Z510 Encounter for antineoplastic radiation therapy: Secondary | ICD-10-CM | POA: Diagnosis not present

## 2019-01-31 DIAGNOSIS — F330 Major depressive disorder, recurrent, mild: Secondary | ICD-10-CM

## 2019-01-31 NOTE — Progress Notes (Signed)
Virtual Visit via Video Note  I connected with Alice Bowers on 01/31/19 at  8:00 AM EDT by a video enabled telemedicine application and verified that I am speaking with the correct person using two identifiers.     I discussed the limitations of evaluation and management by telemedicine and the availability of in person appointments. The patient expressed understanding and agreed to proceed.   Type of Therapy: Individual Therapy   Treatment Goals addressed: Diagnosis: MDD, bereavement   Interventions: CBT and Supportive   Summary: Alice Bowers is a 68 y.o. female who presents with MDD   Suicidal/Homicidal: Nowithout intent/plan   Therapist Response: Alice Bowers met with clinician for an individual session. Alice Bowers discussed her psychiatric symptoms, her current life events and her goals for therapy. Alice Bowers shared that she only has 4 more radiation treatments and she will be done with her breast cancer treatment. Alice Bowers reports having increased energy and feeling stronger. She reports concerns about finances, but is choosing not to worry too much about them until her treatments are done. She also reports that her granddaughter is going to the hospital now to have their third child. Clinician processed the excitement and concern about the birth of the new baby, worries about finances, and some mixed feelings about her daughter not being able to be here. Clinician reassured Alice Bowers and reflected the importance of her own presence in the family.   Plan: Return again in 2 weeks.   Diagnosis: MDD    I discussed the assessment and treatment plan with the patient. The patient was provided an opportunity to ask questions and all were answered. The patient agreed with the plan and demonstrated an understanding of the instructions.   The patient was advised to call back or seek an in-person evaluation if the symptoms worsen or if the condition fails to improve as anticipated.  I provided 40  minutes of non-face-to-face time during this encounter.   [name], LCSW

## 2019-02-01 ENCOUNTER — Ambulatory Visit
Admission: RE | Admit: 2019-02-01 | Discharge: 2019-02-01 | Disposition: A | Discharge status: Home / Self Care | Payer: Medicare PPO | Source: Ambulatory Visit | Attending: Radiation Oncology | Admitting: Radiation Oncology

## 2019-02-01 ENCOUNTER — Other Ambulatory Visit: Payer: Self-pay

## 2019-02-01 DIAGNOSIS — Z510 Encounter for antineoplastic radiation therapy: Secondary | ICD-10-CM | POA: Diagnosis not present

## 2019-02-04 ENCOUNTER — Other Ambulatory Visit: Payer: Self-pay

## 2019-02-04 ENCOUNTER — Ambulatory Visit
Admission: RE | Admit: 2019-02-04 | Discharge: 2019-02-04 | Disposition: A | Discharge status: Home / Self Care | Payer: Medicare PPO | Source: Ambulatory Visit | Attending: Radiation Oncology | Admitting: Radiation Oncology

## 2019-02-04 DIAGNOSIS — Z510 Encounter for antineoplastic radiation therapy: Secondary | ICD-10-CM | POA: Diagnosis not present

## 2019-02-05 ENCOUNTER — Other Ambulatory Visit: Payer: Self-pay

## 2019-02-05 ENCOUNTER — Encounter: Payer: Self-pay | Admitting: Radiation Oncology

## 2019-02-05 ENCOUNTER — Ambulatory Visit
Admission: RE | Admit: 2019-02-05 | Discharge: 2019-02-05 | Disposition: A | Discharge status: Home / Self Care | Payer: Medicare PPO | Source: Ambulatory Visit | Attending: Radiation Oncology | Admitting: Radiation Oncology

## 2019-02-05 DIAGNOSIS — Z510 Encounter for antineoplastic radiation therapy: Secondary | ICD-10-CM | POA: Diagnosis not present

## 2019-02-11 ENCOUNTER — Encounter (HOSPITAL_COMMUNITY): Payer: Self-pay | Admitting: Psychiatry

## 2019-02-11 ENCOUNTER — Other Ambulatory Visit: Payer: Self-pay

## 2019-02-11 ENCOUNTER — Ambulatory Visit (INDEPENDENT_AMBULATORY_CARE_PROVIDER_SITE_OTHER): Payer: Medicare PPO | Admitting: Psychiatry

## 2019-02-11 VITALS — Wt 242.0 lb

## 2019-02-11 DIAGNOSIS — F419 Anxiety disorder, unspecified: Secondary | ICD-10-CM

## 2019-02-11 DIAGNOSIS — F331 Major depressive disorder, recurrent, moderate: Secondary | ICD-10-CM | POA: Diagnosis not present

## 2019-02-11 DIAGNOSIS — F411 Generalized anxiety disorder: Secondary | ICD-10-CM | POA: Diagnosis not present

## 2019-02-11 MED ORDER — ESCITALOPRAM OXALATE 20 MG PO TABS: 20.0000 mg | ORAL_TABLET | Freq: Every day | ORAL | 2 refills | Status: DC

## 2019-02-11 MED ORDER — LAMOTRIGINE 150 MG PO TABS: 150.0000 mg | ORAL_TABLET | Freq: Every day | ORAL | 2 refills | Status: DC

## 2019-02-11 MED ORDER — BUPROPION HCL ER (SR) 150 MG PO TB12: ORAL_TABLET | ORAL | 2 refills | Status: DC

## 2019-02-11 NOTE — Progress Notes (Signed)
Virtual Visit via Telephone Note  I connected with Alice Bowers on 02/11/19 at  8:40 AM EDT by telephone and verified that I am speaking with the correct person using two identifiers.   I discussed the limitations, risks, security and privacy concerns of performing an evaluation and management service by telephone and the availability of in person appointments. I also discussed with the patient that there may be a patient responsible charge related to this service. The patient expressed understanding and agreed to proceed.   History of Present Illness: Patient was evaluated by phone session.  She recently finished her chemotherapy but she still have a lot of health issues.  Lately she had leg swelling and she is now using stocking to reduce swelling.  She admitted weight gain due to steroids which were given for her radiation and chemotherapy but now she started losing weight.  She admitted financial stress as she is looking for a job but so far she has not able to find one.  She admitted due to multiple health issues including cancer, asthma she is going to have a hard time finding a job due to COVID-19.  Last week she had a crying spells when she received a letter to return her car but her son helped her.  She is still not given up and looking for a part-time job.  She is in therapy with [name].  She is sleeping okay.  She denies any crying spells, feeling of hopelessness or worthlessness.  She denies any suicidal thoughts, paranoia or any hallucination.  She feels the Lamictal and Wellbutrin working.  She also takes Lexapro.  She has not taken Ativan except once as she does not have any major panic attack.  She does not want to change medication.  Since she stopped the hemotherapy and radiation her appetite is better.  She has more energy.  She has no tremors, shakes or any rash. Past Psychiatric History:Reviewed. History of depression since 1995. At that time she was going through divorce.  History of4 psychiatric hospitalization. Onesuicidal attempt by taking overdose on Seroquel. No historyof paranoia, hallucination or aggressive behavior. Tried Paxil, Zoloft, Remeron(weight gain)and Seroquel with limited response.    Psychiatric Specialty Exam: Physical Exam  ROS  There were no vitals taken for this visit.There is no height or weight on file to calculate BMI.  General Appearance: NA  Eye Contact:  NA  Speech:  Clear and Coherent and Slow  Volume:  Normal  Mood:  Anxious and Dysphoric  Affect:  NA  Thought Process:  Goal Directed  Orientation:  Full (Time, Place, and Person)  Thought Content:  Rumination  Suicidal Thoughts:  No  Homicidal Thoughts:  No  Memory:  Immediate;   Good Recent;   Good Remote;   Good  Judgement:  Good  Insight:  Good  Psychomotor Activity:  NA  Concentration:  Concentration: Fair and Attention Span: Fair  Recall:  Good  Fund of Knowledge:  Good  Language:  Good  Akathisia:  No  Handed:  Right  AIMS (if indicated):     Assets:  Communication Skills Desire for Improvement Housing Resilience Social Support  ADL's:  Intact  Cognition:  WNL  Sleep:   fair      Assessment and Plan: Major depressive disorder, recurrent.  Generalized anxiety disorder.  Panic attacks.  Discuss her psychosocial issues.  She is looking for a part-time job to pay her bills.  She is pleased her chemotherapy and radiation is ended and  slowly and gradually her energy level is getting better.  She does not want to change medication.  I will continue Wellbutrin SR 300 mg in the morning and 150 at bedtime, continue Lexapro 20 mg daily and Lamictal 150 mg daily.  She does not need Ativan at this time.  Discussed medication side effects and benefits.  Encouraged to continue therapy with [name].  Recommended to call us back if she has any question or any concern.  Follow-up in 3 months.  Follow Up Instructions:    I discussed the assessment and  treatment plan with the patient. The patient was provided an opportunity to ask questions and all were answered. The patient agreed with the plan and demonstrated an understanding of the instructions.   The patient was advised to call back or seek an in-person evaluation if the symptoms worsen or if the condition fails to improve as anticipated.  I provided 15 minutes of non-face-to-face time during this encounter.   Kathlee Nations, MD

## 2019-02-12 ENCOUNTER — Encounter (HOSPITAL_COMMUNITY): Payer: Self-pay | Admitting: Licensed Clinical Social Worker

## 2019-02-12 ENCOUNTER — Other Ambulatory Visit: Payer: Self-pay

## 2019-02-12 ENCOUNTER — Ambulatory Visit (INDEPENDENT_AMBULATORY_CARE_PROVIDER_SITE_OTHER): Payer: Medicare PPO | Admitting: Licensed Clinical Social Worker

## 2019-02-12 DIAGNOSIS — F331 Major depressive disorder, recurrent, moderate: Secondary | ICD-10-CM | POA: Diagnosis not present

## 2019-02-12 NOTE — Progress Notes (Signed)
Virtual Visit via Video Note  I connected with Alice Bowers on 02/12/19 at  9:00 AM EDT by a video enabled telemedicine application and verified that I am speaking with the correct person using two identifiers.     I discussed the limitations of evaluation and management by telemedicine and the availability of in person appointments. The patient expressed understanding and agreed to proceed.  Type of Therapy: Individual Therapy  Treatment Goals addressed: Diagnosis: MDD  Interventions: CBT and Supportive  Summary: Alice Bowers is a 68 y.o. female who presents with MDD and Bereavement  Suicidal/Homicidal: Nowithout intent/plan  Therapist Response: Alice Bowers met with clinician for an individual session. Alice Bowers discussed her psychiatric symptoms, her current life events and her goals for therapy. Alice Bowers shared that she has been more depressed due to medical and financial problems than anything else right now. She reported concerns over being able to pay her car payment on top of mounting medical bills. Alice Bowers reports she completed her cancer treatment and does not have to drive to [facility] for any more chemo or radiation. Clinician processed the experience and noted how well she did over the course of the treatment. Alice Bowers reports she got to spend some time with her new great-grandson, who was born about 2 weeks ago.  Clinician discussed ways to find gratitude and practice mindfulness on a daily basis to assist with mood regulation.   Plan: Return again in 2 weeks.  Diagnosis: MDD    I discussed the assessment and treatment plan with the patient. The patient was provided an opportunity to ask questions and all were answered. The patient agreed with the plan and demonstrated an understanding of the instructions.   The patient was advised to call back or seek an in-person evaluation if the symptoms worsen or if the condition fails to improve as anticipated.  I provided 40 minutes of  non-face-to-face time during this encounter.   Alice Curling, LCSW

## 2019-02-26 ENCOUNTER — Ambulatory Visit (INDEPENDENT_AMBULATORY_CARE_PROVIDER_SITE_OTHER): Payer: Medicare PPO | Admitting: Licensed Clinical Social Worker

## 2019-02-26 ENCOUNTER — Encounter (HOSPITAL_COMMUNITY): Payer: Self-pay | Admitting: Licensed Clinical Social Worker

## 2019-02-26 ENCOUNTER — Other Ambulatory Visit: Payer: Self-pay

## 2019-02-26 DIAGNOSIS — F331 Major depressive disorder, recurrent, moderate: Secondary | ICD-10-CM | POA: Diagnosis not present

## 2019-02-26 NOTE — Progress Notes (Signed)
Virtual Visit via Video Note  I connected with Alice Bowers on 02/26/19 at  9:00 AM EDT by a video enabled telemedicine application and verified that I am speaking with the correct person using two identifiers.     I discussed the limitations of evaluation and management by telemedicine and the availability of in person appointments. The patient expressed understanding and agreed to proceed.  Type of Therapy: Individual Therapy  Treatment Goals addressed: Diagnosis: MDD, bereavement  Interventions: CBT and Supportive  Summary: Alice Bowers is a 68 y.o. female who presents with MDD   Suicidal/Homicidal: Nowithout intent/plan  Therapist Response: [name] met with clinician for an individual session. [name] discussed her psychiatric symptoms, her current life events and her goals for therapy. Alice Bowers shared that she is feeling somewhat depressed and helpless due to not being able to find a job. She reports concern about finances and reported that it is difficult to get a job where she can work from home. Clinician explored methods of job searching and encouraged her to continue sending out resumes as much as possible. Alice Bowers identified concerns about going back to work at [employer] due to exposure to others, especially since her immune system is compromised due to completing cancer treatment. [name] processed thoughts and feelings about losing her hair. She reports it is growing back darker and finer, but it is still very short and she is trying to be patient. Clinician explored contact and relationship with son in law and granddaughter. She reports they came by the other day and spent some time together.   Plan: Return again in 2 weeks.  Diagnosis: MDD     I discussed the assessment and treatment plan with the patient. The patient was provided an opportunity to ask questions and all were answered. The patient agreed with the plan and demonstrated an understanding of the  instructions.   The patient was advised to call back or seek an in-person evaluation if the symptoms worsen or if the condition fails to improve as anticipated.  I provided 45 minutes of non-face-to-face time during this encounter.   [name], LCSW

## 2019-03-12 ENCOUNTER — Other Ambulatory Visit: Payer: Self-pay

## 2019-03-12 ENCOUNTER — Ambulatory Visit (INDEPENDENT_AMBULATORY_CARE_PROVIDER_SITE_OTHER): Payer: Medicare PPO | Admitting: Licensed Clinical Social Worker

## 2019-03-12 ENCOUNTER — Encounter (HOSPITAL_COMMUNITY): Payer: Self-pay | Admitting: Licensed Clinical Social Worker

## 2019-03-12 DIAGNOSIS — F331 Major depressive disorder, recurrent, moderate: Secondary | ICD-10-CM | POA: Diagnosis not present

## 2019-03-12 NOTE — Progress Notes (Signed)
Virtual Visit via Video Note  I connected with Alice Bowers on 03/12/19 at  9:00 AM EDT by a video enabled telemedicine application and verified that I am speaking with the correct person using two identifiers.    I discussed the limitations of evaluation and management by telemedicine and the availability of in person appointments. The patient expressed understanding and agreed to proceed.  Type of Therapy: Individual Therapy  Treatment Goals addressed: Diagnosis: MDD  Interventions: CBT and Supportive  Summary: Alice Bowers is a 68 y.o. female who presents with MDD   Suicidal/Homicidal: Nowithout intent/plan  Therapist Response: Alice Bowers met with clinician for an individual session. Alice Bowers discussed her psychiatric symptoms, her current life events and her goals for therapy. Alice Bowers shared that she continues to feel stressed and worried about finding a job. She reports feeling discriminated due to her age. She also reports that she cannot find a job where she can work from home. 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 Clinician provided some feedback about resume writing. Clinician also identified the importance of taking care of her home and using her time to get organized and cleaned up around the house.   Plan: Return again in 2 weeks.  Diagnosis: MDD     I discussed the assessment and treatment plan with the patient. The patient was provided an opportunity to ask questions and all were answered. The patient agreed with the plan and demonstrated an understanding of the instructions.   The patient was advised to call back or seek an in-person evaluation if the symptoms worsen or if the condition fails to improve as anticipated.  I provided 45 minutes of non-face-to-face time during this encounter.   [name], LCSW

## 2019-03-19 NOTE — Progress Notes (Signed)
  Radiation Oncology         [phone]) [phone] ________________________________  Name: Alice Bowers MRN: [id number]  Date: 02/05/2019  DOB: August 09, 1951  End of Treatment Note  Diagnosis:   68 y.o. 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 The patient initially received a dose of 50.4 Gy in 28 fractions to the right chest wall and supraclavicular region. This was delivered using a 3-D conformal, 4 field technique.  2. The patient then received a boost to the mastectomy scar. This delivered an additional 10 Gy in 5 fractions using an en face electron field.  The total dose was 60.4 Gy.  Beams/energy: 1. 3D / 10X, 15X Photon 2. 6 MeV electron boost  Narrative: The patient tolerated radiation treatment relatively well.   The patient had some expected skin irritation with erythema as she progressed during treatment. Moist desquamation was not present at the end of treatment. She was encouraged to continue her Radiaplex gel until it is gone and then switch over to vitamin E oil/cream. She also noted increased fatigue and tenderness under her right arm.  Plan: The patient has completed radiation treatment. The patient will return to radiation oncology clinic for routine followup in one month. I advised the patient to call or return sooner if they have any questions or concerns related to their recovery or treatment. ________________________________  Jodelle Gross, MD, PhD  This document serves as a record of services personally performed by Kyung Rudd, MD. It was created on his behalf by Rae Lips, a trained medical scribe. The creation of this record is based on the scribe's personal observations and the provider's statements to them. This document has been checked and approved by the attending provider.

## 2019-03-20 ENCOUNTER — Telehealth: Payer: Self-pay | Admitting: Radiation Oncology

## 2019-03-20 NOTE — Telephone Encounter (Signed)
  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  Interval Since Last Radiation:  6 weeks   12/20/2018 - 02/05/2019:   1. The patient initially received a dose of 50.4 Gy in 28 fractions to the right chest wall and supraclavicular region. This was delivered using a 3-D conformal, 4 field technique.  2. The patient then received a boost to the mastectomy scar. This delivered an additional 10 Gy in 5 fractions using an en face electron field.  The total dose was 60.4 Gy.  Narrative:  The patient was contacted today for routine follow-up. During treatment she did very well with radiotherapy and did not have significant desquamation. She reports she is doing well and recently started antiestrogen therapy. She reports her skin has healed well and she is no longer using any skin cream.  Impression/Plan: 1. 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 The patient has been doing well since completion of radiotherapy. We discussed that we would be happy to continue to follow her as needed, but she will also continue to follow up  in medical oncology in [place] at [facility]. She was counseled on skin care as well as measures to avoid sun exposure to this area.     Carola Rhine, PAC

## 2019-03-26 ENCOUNTER — Encounter (HOSPITAL_COMMUNITY): Payer: Self-pay | Admitting: Licensed Clinical Social Worker

## 2019-03-26 ENCOUNTER — Ambulatory Visit (INDEPENDENT_AMBULATORY_CARE_PROVIDER_SITE_OTHER): Payer: Medicare Other | Admitting: Licensed Clinical Social Worker

## 2019-03-26 ENCOUNTER — Other Ambulatory Visit: Payer: Self-pay

## 2019-03-26 DIAGNOSIS — F331 Major depressive disorder, recurrent, moderate: Secondary | ICD-10-CM | POA: Diagnosis not present

## 2019-03-26 NOTE — Progress Notes (Signed)
Virtual Visit via Video Note  I connected with Alice Bowers on 03/26/19 at  9:00 AM EDT by a video enabled telemedicine application and verified that I am speaking with the correct person using two identifiers.     I discussed the limitations of evaluation and management by telemedicine and the availability of in person appointments. The patient expressed understanding and agreed to proceed.  Type of Therapy: Individual Therapy  Treatment Goals addressed: Diagnosis: MDD, bereavement  Interventions: CBT and Supportive  Summary: Alice Bowers is a 68 y.o. female who presents with MDD  Suicidal/Homicidal: Nowithout intent/plan  Therapist Response: Alice Bowers met with clinician for an individual session. Alice Bowers discussed her psychiatric symptoms, her current life events and her goals for therapy. Alice Bowers shared that she has met with her surgeon and decided to have her other breast removed as a pre-emptive intervention for recurrent breast cancer. She reports that due to the type of breast cancer she had, it is the 2nd most common and most likely to recur in the other breast. Clinician processed this decision and validated Alice Bowers's decision to take her health into her own hands. Clinician also identified the value this will provide for her mental health, as she will not have to worry about having the breast cancer recur on the other side. Alice Bowers continues to struggle financially and has not been able to secure employment. However, she reported that she has made peace with it for now, as she wants to schedule the surgery and get healed before starting a new job. Clinician processed this with Alice Bowers using [employer]. Clinician reflected the thoughtfulness and the empowerment present in Alice Bowers through this experience. Clinician processed relationships and interactions with family and noted that she has offered to help her granddaughter with school work if she goes [employer]film [employer]. Clinician noted that this may  also be a great way to reestablish her relationship with granddaughter which has been strained since the death of her daughter.   Plan: Return again in 2 weeks.  Diagnosis: MDD    I discussed the assessment and treatment plan with the patient. The patient was provided an opportunity to ask questions and all were answered. The patient agreed with the plan and demonstrated an understanding of the instructions.   The patient was advised to call back or seek an in-person evaluation if the symptoms worsen or if the condition fails to improve as anticipated.  I provided 45 minutes of non-face-to-face time during this encounter.   [name], LCSW

## 2019-04-09 ENCOUNTER — Other Ambulatory Visit: Payer: Self-pay

## 2019-04-09 ENCOUNTER — Ambulatory Visit (INDEPENDENT_AMBULATORY_CARE_PROVIDER_SITE_OTHER): Payer: Medicare Other | Admitting: Licensed Clinical Social Worker

## 2019-04-09 ENCOUNTER — Encounter (HOSPITAL_COMMUNITY): Payer: Self-pay | Admitting: Licensed Clinical Social Worker

## 2019-04-09 DIAGNOSIS — F330 Major depressive disorder, recurrent, mild: Secondary | ICD-10-CM

## 2019-04-09 NOTE — Progress Notes (Signed)
Virtual Visit via Video Note  I connected with Alice Bowers on 04/09/19 at  9:00 AM EDT by a video enabled telemedicine application and verified that I am speaking with the correct person using two identifiers.    I discussed the limitations of evaluation and management by telemedicine and the availability of in person appointments. The patient expressed understanding and agreed to proceed.  Type of Therapy: Individual Therapy  Treatment Goals addressed: Diagnosis: MDD, mild  Interventions: CBT   Summary: Alice Bowers is a 68 y.o. female who presents with MDD   Suicidal/Homicidal: Nowithout intent/plan  Therapist Response: [name] met with clinician for an individual session. Dylann discussed her psychiatric symptoms, her current life events and her goals for therapy. Alice Bowers shared that she will have her other breast removed on Monday of next week by her surgeon who treated her breast cancer in the first breast. She reports comfort and a level of excitement to get this done. Clinician processed the plan and identified the importance of taking time to heal before taking on any more job searching or major plans. Clinician discussed Alice Bowers's concerns about her son, who lives very close to the wildfires in [place]. Clinician processed concerns and identified the importance of maintaining contact with her son to ensure his safety. Clinician also reminded Alice Bowers that he is an adult and has to be responsible to make his own decisions about evacuating.   Plan: Return again in 2 weeks.  Diagnosis: MDD, mild   I discussed the assessment and treatment plan with the patient. The patient was provided an opportunity to ask questions and all were answered. The patient agreed with the plan and demonstrated an understanding of the instructions.   The patient was advised to call back or seek an in-person evaluation if the symptoms worsen or if the condition fails to improve as anticipated.  I  provided 45 minutes of non-face-to-face time during this encounter.   [name], LCSW

## 2019-04-23 ENCOUNTER — Other Ambulatory Visit: Payer: Self-pay

## 2019-04-23 ENCOUNTER — Ambulatory Visit (HOSPITAL_COMMUNITY): Payer: Medicare Other | Admitting: Licensed Clinical Social Worker

## 2019-05-07 ENCOUNTER — Ambulatory Visit (INDEPENDENT_AMBULATORY_CARE_PROVIDER_SITE_OTHER): Payer: Medicare Other | Admitting: Licensed Clinical Social Worker

## 2019-05-07 ENCOUNTER — Encounter (HOSPITAL_COMMUNITY): Payer: Self-pay | Admitting: Licensed Clinical Social Worker

## 2019-05-07 ENCOUNTER — Other Ambulatory Visit: Payer: Self-pay

## 2019-05-07 DIAGNOSIS — F331 Major depressive disorder, recurrent, moderate: Secondary | ICD-10-CM | POA: Diagnosis not present

## 2019-05-07 NOTE — Progress Notes (Signed)
Virtual Visit via Video Note  I connected with Alice Bowers on 05/07/19 at  9:00 AM EDT by a video enabled telemedicine application and verified that I am speaking with the correct person using two identifiers.     I discussed the limitations of evaluation and management by telemedicine and the availability of in person appointments. The patient expressed understanding and agreed to proceed.  Type of Therapy: Individual Therapy  Treatment Goals addressed: Diagnosis: MDD, bereavement  Interventions: CBT and Supportive  Summary: Alice Bowers is a 68 y.o. female who presents with MDD. moderate  Suicidal/Homicidal: Nowithout intent/plan  Therapist Response: [name] met with clinician for an individual session. [name] discussed her psychiatric symptoms, her current life events and her goals for therapy. Alice Bowers shared that she has been depressed and frustrated due to not being able to find a job. She reports she has been completely financially drained and even getting her next check tomorrow will not really move her bank account to a comfortable spot, as the money is "already spent". Clinician explored job search and noted sense of being discriminated due to her age. 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 Clinician processed thoughts and feelings about this. Clinician explored options for job searching, as well as other options to get some extra money.  Alice Bowers reports overall she is doing better, in terms of her physical health. She had the second mastectomy a few weeks ago and still has some soreness that was not there at the first surgery. She continues to follow up with her doctors to make sure there is no infection.   Plan: Return again in 2 weeks.  Diagnosis: MDD, moderate   I discussed the assessment and treatment plan with the patient. The patient was provided an opportunity to ask  questions and all were answered. The patient agreed with the plan and demonstrated an understanding of the instructions.   The patient was advised to call back or seek an in-person evaluation if the symptoms worsen or if the condition fails to improve as anticipated.  I provided 45 minutes of non-face-to-face time during this encounter.   [name], LCSW

## 2019-05-14 ENCOUNTER — Other Ambulatory Visit: Payer: Self-pay

## 2019-05-14 ENCOUNTER — Encounter (HOSPITAL_COMMUNITY): Payer: Self-pay | Admitting: Psychiatry

## 2019-05-14 ENCOUNTER — Ambulatory Visit (INDEPENDENT_AMBULATORY_CARE_PROVIDER_SITE_OTHER): Payer: Medicare Other | Admitting: Psychiatry

## 2019-05-14 DIAGNOSIS — F410 Panic disorder [episodic paroxysmal anxiety] without agoraphobia: Secondary | ICD-10-CM

## 2019-05-14 DIAGNOSIS — F411 Generalized anxiety disorder: Secondary | ICD-10-CM | POA: Diagnosis not present

## 2019-05-14 DIAGNOSIS — F331 Major depressive disorder, recurrent, moderate: Secondary | ICD-10-CM

## 2019-05-14 MED ORDER — ESCITALOPRAM OXALATE 20 MG PO TABS: 20.0000 mg | ORAL_TABLET | Freq: Every day | ORAL | 2 refills | Status: DC

## 2019-05-14 MED ORDER — BUPROPION HCL ER (SR) 150 MG PO TB12: ORAL_TABLET | ORAL | 2 refills | Status: DC

## 2019-05-14 MED ORDER — LORAZEPAM 0.5 MG PO TABS: 0.5000 mg | ORAL_TABLET | Freq: Every day | ORAL | 0 refills | Status: DC | PRN

## 2019-05-14 MED ORDER — LAMOTRIGINE 150 MG PO TABS: 150.0000 mg | ORAL_TABLET | Freq: Every day | ORAL | 2 refills | Status: DC

## 2019-05-14 NOTE — Progress Notes (Signed)
Virtual Visit via Telephone Note  I connected with Alice Bowers on 05/14/19 at  8:40 AM EDT by telephone and verified that I am speaking with the correct person using two identifiers.   I discussed the limitations, risks, security and privacy concerns of performing an evaluation and management service by telephone and the availability of in person appointments. I also discussed with the patient that there may be a patient responsible charge related to this service. The patient expressed understanding and agreed to proceed.   History of Present Illness: Patient was evaluated by phone session.  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  Her physician found some spots and recommended either due for radiation and chemo or mastectomy and she chooses mastectomy.  She is doing better.  However she is very worried about her not getting job.  She had applied 15 places but so far she has not heard from any way.  She admitted having panic attacks and she took lorazepam 2 weeks ago.  Overall she describes her health is now stable.  She is sleeping better.  She denies any irritability, crying spells or any feeling of hopelessness or worthlessness.  She is seen [name] for therapy.  Her energy level is better.  Her weight is 234.  She is eating good.  She is no longer taking narcotic pain medication and muscle relaxant.  She is compliant with Lamictal, Wellbutrin, Lexapro.  She has no tremors or shakes.  She denies any suicidal thoughts.  She is in therapy with [name].  She denies drinking or using any illegal substances.    Past Psychiatric History:Reviewed. History of depression since 1995. At that time she was going through divorce. History of4 psychiatric hospitalization. Onesuicidal attempt by taking overdose on Seroquel. No historyof paranoia, hallucination or aggressive behavior. Tried Paxil, Zoloft, Remeron(weight gain)and Seroquel with limited response.    Psychiatric Specialty  Exam: Physical Exam  ROS  There were no vitals taken for this visit.There is no height or weight on file to calculate BMI.  General Appearance: NA  Eye Contact:  NA  Speech:  Clear and Coherent and Slow  Volume:  Normal  Mood:  Anxious and Dysphoric  Affect:  NA  Thought Process:  Goal Directed  Orientation:  Full (Time, Place, and Person)  Thought Content:  Rumination  Suicidal Thoughts:  No  Homicidal Thoughts:  No  Memory:  Immediate;   Good Recent;   Good Remote;   Good  Judgement:  Good  Insight:  Good  Psychomotor Activity:  NA  Concentration:  Concentration: Good and Attention Span: Good  Recall:  Good  Fund of Knowledge:  Good  Language:  Good  Akathisia:  No  Handed:  Right  AIMS (if indicated):     Assets:  Communication Skills Desire for Improvement Housing Resilience  ADL's:  Intact  Cognition:  WNL  Sleep:   fair     Assessment and Plan: Major depressive disorder, recurrent.  Panic attacks.  Generalized anxiety disorder.  Discuss her psychosocial stressors and review her medication.  She is no longer taking narcotic pain medication.  She recently had left mastectomy and doing much better.  Reassurance given about her jobs.  She had applied multiple places and hoping to hear from them.  She like to continue medicine which is helping her.  I will continue Wellbutrin SR 300 mg in the morning and 150 at bedtime, Lexapro 20 mg daily and Lamictal 150 mg daily.  She has no rash, itching  or shakes.  We will provide 10 tablets of lorazepam which she takes only for severe panic attack.  Encouraged to continue therapy with [name].  Recommended to call us back if she has any question or any concern.  Follow-up in 3 months.  Follow Up Instructions:    I discussed the assessment and treatment plan with the patient. The patient was provided an opportunity to ask questions and all were answered. The patient agreed with the plan and demonstrated an understanding of the  instructions.   The patient was advised to call back or seek an in-person evaluation if the symptoms worsen or if the condition fails to improve as anticipated.  I provided 20 minutes of non-face-to-face time during this encounter.   Alice Nations, MD

## 2019-05-23 ENCOUNTER — Other Ambulatory Visit: Payer: Self-pay

## 2019-05-23 ENCOUNTER — Encounter (HOSPITAL_COMMUNITY): Payer: Self-pay | Admitting: Licensed Clinical Social Worker

## 2019-05-23 ENCOUNTER — Ambulatory Visit (INDEPENDENT_AMBULATORY_CARE_PROVIDER_SITE_OTHER): Payer: Medicare Other | Admitting: Licensed Clinical Social Worker

## 2019-05-23 DIAGNOSIS — F330 Major depressive disorder, recurrent, mild: Secondary | ICD-10-CM | POA: Diagnosis not present

## 2019-05-23 NOTE — Progress Notes (Signed)
Virtual Visit via Video Note  I connected with Alice Bowers on 05/23/19 at  9:00 AM EDT by a video enabled telemedicine application and verified that I am speaking with the correct person using two identifiers.     I discussed the limitations of evaluation and management by telemedicine and the availability of in person appointments. The patient expressed understanding and agreed to proceed.   Type of Therapy: Individual Therapy  Treatment Goals addressed: Diagnosis: MDD, bereavement  Interventions: CBT and Supportive  Summary: Alice Bowers is a 68 y.o. female who presents with MDD. mild  Suicidal/Homicidal: Nowithout intent/plan  Therapist Response: [name] met with clinician for an individual session. Alice Bowers discussed her psychiatric symptoms, her current life events and her goals for therapy. 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 She reports feeling optimistic about 2 of the 3 interviews, including one that she worked at 20 years ago. 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 Alice Bowers reports she is really looking forward to getting back to work, in order to make some money and do something. She reports she has worked 3rd shift in the past and is fine with it, as it will be easier for her to schedule appointments without having to call out. Clinician discussed new dx of Narcolepsy and identified new medication, Sunosi, which was prescribed by neurologist. [name] reports that so far, she has felt more awake and alert during the day with this new medication. She does worry about the cost, but reports she will talk to her doctor more about this at next appointment in 2 weeks. Clinician discussed mood, attitude, and noted ongoing positive outlook on life. Alice Bowers reports she is still having a lot of fluid buildup after having mastectomy. However, she reports  otherwise she is feeling good.    Plan: Return again in 2 weeks.  Diagnosis: MDD, mild  I discussed the assessment and treatment plan with the patient. The patient was provided an opportunity to ask questions and all were answered. The patient agreed with the plan and demonstrated an understanding of the instructions.   The patient was advised to call back or seek an in-person evaluation if the symptoms worsen or if the condition fails to improve as anticipated.  I provided 45 minutes of non-face-to-face time during this encounter.   [name], LCSW

## 2019-06-06 ENCOUNTER — Other Ambulatory Visit: Payer: Self-pay

## 2019-06-06 ENCOUNTER — Encounter (HOSPITAL_COMMUNITY): Payer: Self-pay | Admitting: Licensed Clinical Social Worker

## 2019-06-06 ENCOUNTER — Ambulatory Visit (INDEPENDENT_AMBULATORY_CARE_PROVIDER_SITE_OTHER): Payer: Medicare Other | Admitting: Licensed Clinical Social Worker

## 2019-06-06 DIAGNOSIS — F330 Major depressive disorder, recurrent, mild: Secondary | ICD-10-CM | POA: Diagnosis not present

## 2019-06-06 NOTE — Progress Notes (Signed)
Virtual Visit via Video Note  I connected with Alice Bowers on 06/06/19 at  9:00 AM EDT by a video enabled telemedicine application and verified that I am speaking with the correct person using two identifiers.     I discussed the limitations of evaluation and management by telemedicine and the availability of in person appointments. The patient expressed understanding and agreed to proceed.  Type of Therapy: Individual Therapy  Treatment Goals addressed: Diagnosis: MDD, bereavement  Interventions: CBT and Supportive  Summary: Alice Bowers is a 68 y.o. female who presents with MDD. mild  Suicidal/Homicidal: Nowithout intent/plan  Therapist Response: Alice Bowers met with clinician for an individual session. Alice Bowers discussed her psychiatric symptoms, her current life events and her goals for therapy. 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 Clinician discussed the position, hours, and responsibilities of the job, as well as some exciting news about the pay. Alice Bowers reports she is optimistic about the job and is looking forward to having less worries about finances in the coming weeks. Clinician discussed mood and other physical/social interactions. Alice Bowers reports she has mostly been home, as she is low on money to drive anywhere or go eat anywhere. She reports being low on food, but having enough to get her through the next week until she gets her pension/social security checks. Clinician offered to research food banks to get some basics, but Alice Bowers reports that she has enough to get by and worst case scenario, her neighbor will feed her dinner.    Plan: Return again in 2 weeks.  Diagnosis: MDD, mild   I discussed the assessment and treatment plan with the patient. The patient was provided an opportunity to ask questions and all were answered. The patient agreed with the  plan and demonstrated an understanding of the instructions.   The patient was advised to call back or seek an in-person evaluation if the symptoms worsen or if the condition fails to improve as anticipated.  I provided 40 minutes of non-face-to-face time during this encounter.   [name], LCSW

## 2019-06-15 ENCOUNTER — Other Ambulatory Visit (HOSPITAL_COMMUNITY): Payer: Self-pay | Admitting: Psychiatry

## 2019-06-15 DIAGNOSIS — F331 Major depressive disorder, recurrent, moderate: Secondary | ICD-10-CM

## 2019-06-20 ENCOUNTER — Encounter (HOSPITAL_COMMUNITY): Payer: Self-pay | Admitting: Licensed Clinical Social Worker

## 2019-06-20 ENCOUNTER — Ambulatory Visit (INDEPENDENT_AMBULATORY_CARE_PROVIDER_SITE_OTHER): Payer: Medicare Other | Admitting: Licensed Clinical Social Worker

## 2019-06-20 ENCOUNTER — Other Ambulatory Visit: Payer: Self-pay

## 2019-06-20 DIAGNOSIS — F330 Major depressive disorder, recurrent, mild: Secondary | ICD-10-CM

## 2019-06-20 DIAGNOSIS — F4321 Adjustment disorder with depressed mood: Secondary | ICD-10-CM | POA: Diagnosis not present

## 2019-06-20 NOTE — Progress Notes (Signed)
Virtual Visit via Video Note  I connected with Alice Bowers on 06/20/19 at  9:00 AM EST by a video enabled telemedicine application and verified that I am speaking with the correct person using two identifiers.     I discussed the limitations of evaluation and management by telemedicine and the availability of in person appointments. The patient expressed understanding and agreed to proceed.  Type of Therapy: Individual Therapy  Treatment Goals addressed: Diagnosis: MDD, bereavement  Interventions: CBT and Supportive  Summary: Alice Bowers is a 68 y.o. female who presents with MDD. mild  Suicidal/Homicidal: Nowithout intent/plan  Therapist Response: [name] met with clinician for an individual session. Alice Bowers discussed her psychiatric symptoms, her current life events and her goals for therapy. Alice Bowers shared thatshe has been working at her new job and she is still training, She reports that so far, she likes her job and her coworkers. She reported that things will be different once she is working on her own. Clinician explored Alice Bowers's ability to stay up through the night. 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 She reports she requested to do 4-10 hour shifts rather than 3-12 hour shifts and this seems to be working for her. Clinician discussed family relationships and noted ongoing grief over the losses of her husband and her daughter. Clinician discussed Alice Bowers's identity as a helper and noted the heart break due to not being able to help her daughter. Clinician reassured Alice Bowers that she absolutely helped her daughter and continues to help her daughter by being here for her granddaughter. Clinician processed grief and encouraged Alice Bowers to keep her mind open to "signs" from her loved ones who  have passed.    Plan: Return again in 2 weeks.  Diagnosis: MDD, mild   I discussed the assessment and treatment plan with the patient. The patient was provided an opportunity to ask questions and all were answered. The patient agreed with the plan and demonstrated an understanding of the instructions.   The patient was advised to call back or seek an in-person evaluation if the symptoms worsen or if the condition fails to improve as anticipated.  I provided 45 minutes of non-face-to-face time during this encounter.   Alice Curling, LCSW

## 2019-07-04 ENCOUNTER — Other Ambulatory Visit: Payer: Self-pay

## 2019-07-04 ENCOUNTER — Encounter (HOSPITAL_COMMUNITY): Payer: Self-pay | Admitting: Licensed Clinical Social Worker

## 2019-07-04 ENCOUNTER — Ambulatory Visit (INDEPENDENT_AMBULATORY_CARE_PROVIDER_SITE_OTHER): Payer: Medicare Other | Admitting: Licensed Clinical Social Worker

## 2019-07-04 DIAGNOSIS — F330 Major depressive disorder, recurrent, mild: Secondary | ICD-10-CM

## 2019-07-04 NOTE — Progress Notes (Signed)
Virtual Visit via Video Note  I connected with Alice Bowers on 07/04/19 at  9:00 AM EST by a video enabled telemedicine application and verified that I am speaking with the correct person using two identifiers.    I discussed the limitations of evaluation and management by telemedicine and the availability of in person appointments. The patient expressed understanding and agreed to proceed.  Type of Therapy: Individual Therapy  Treatment Goals addressed: Diagnosis: MDD, bereavement  Interventions: CBT and Supportive  Summary: Alice Bowers is a 68 y.o. female who presents with MDD. mild  Suicidal/Homicidal: Nowithout intent/plan  Therapist Response: Alice Bowers met with clinician for an individual session. Alice Bowers discussed her psychiatric symptoms, her current life events and her goals for therapy. [name] shared thatshe has feeling better lately, since she has been working. 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 Clinician discussed the job and noted all of the safety precautions, including twice weekly COVID tests. 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 Clinician explored thoughts and feelings about working overnight and noted that this is actually safer for her, as she has little interactions with patients, and only a few staff members. [name] identified that she feels much more secure now that she has her paychecks coming in regularly.    Plan: Return again in 2 weeks.  Diagnosis: MDD, mild   I discussed the assessment and treatment plan with the patient. The patient was provided an opportunity to ask questions and all were answered. The patient agreed with the plan and demonstrated an understanding of the instructions.   The patient was advised to call back or seek an in-person evaluation if the symptoms worsen or if the condition fails to improve as anticipated.  I provided 45  minutes of non-face-to-face time during this encounter.   [name], LCSW

## 2019-07-18 ENCOUNTER — Other Ambulatory Visit: Payer: Self-pay

## 2019-07-18 ENCOUNTER — Ambulatory Visit (INDEPENDENT_AMBULATORY_CARE_PROVIDER_SITE_OTHER): Payer: Medicare Other | Admitting: Licensed Clinical Social Worker

## 2019-07-18 ENCOUNTER — Encounter (HOSPITAL_COMMUNITY): Payer: Self-pay | Admitting: Licensed Clinical Social Worker

## 2019-07-18 DIAGNOSIS — F330 Major depressive disorder, recurrent, mild: Secondary | ICD-10-CM

## 2019-07-18 NOTE — Progress Notes (Signed)
Virtual Visit via Video Note  I connected with Alice Bowers on 07/18/19 at  9:00 AM EST by a video enabled telemedicine application and verified that I am speaking with the correct person using two identifiers.    I discussed the limitations of evaluation and management by telemedicine and the availability of in person appointments. The patient expressed understanding and agreed to proceed.  Type of Therapy: Individual Therapy  Treatment Goals addressed: Diagnosis: MDD, bereavement  Interventions: CBT and Supportive  Summary: Alice Bowers is a 68 y.o. female who presents with MDD. mild  Suicidal/Homicidal: Nowithout intent/plan  Therapist Response: [name] met with clinician for an individual session. Alice Bowers discussed her psychiatric symptoms, her current life events and her goals for therapy. Alice Bowers shared thatshe felt frustrated this morning because she was still at work at [employer], even though she was supposed to be off at 5am. Clinician explored the events of the evening shift and processed through her willingness and choice to stay and provide assistance. 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 Clinician explored options for communicating her concerns to her managers and explore their responses. Clinician reflected that when she is tired, she also feels less tolerant.   Plan: Return again in 2 weeks.  Diagnosis: MDD, mild    I discussed the assessment and treatment plan with the patient. The patient was provided an opportunity to ask questions and all were answered. The patient agreed with the plan and demonstrated an understanding of the instructions.   The patient was advised to call back or seek an in-person evaluation if the symptoms worsen or if the condition fails to improve as anticipated.  I provided 40 minutes of  non-face-to-face time during this encounter.   Alice Curling, LCSW

## 2019-07-30 ENCOUNTER — Ambulatory Visit (INDEPENDENT_AMBULATORY_CARE_PROVIDER_SITE_OTHER): Payer: Medicare Other | Admitting: Psychiatry

## 2019-07-30 ENCOUNTER — Other Ambulatory Visit: Payer: Self-pay

## 2019-07-30 ENCOUNTER — Encounter (HOSPITAL_COMMUNITY): Payer: Self-pay | Admitting: Psychiatry

## 2019-07-30 DIAGNOSIS — F411 Generalized anxiety disorder: Secondary | ICD-10-CM

## 2019-07-30 DIAGNOSIS — F419 Anxiety disorder, unspecified: Secondary | ICD-10-CM

## 2019-07-30 DIAGNOSIS — F331 Major depressive disorder, recurrent, moderate: Secondary | ICD-10-CM | POA: Diagnosis not present

## 2019-07-30 MED ORDER — ESCITALOPRAM OXALATE 20 MG PO TABS: 20.0000 mg | ORAL_TABLET | Freq: Every day | ORAL | 2 refills | Status: DC

## 2019-07-30 MED ORDER — LAMOTRIGINE 150 MG PO TABS: 150.0000 mg | ORAL_TABLET | Freq: Every day | ORAL | 2 refills | Status: DC

## 2019-07-30 MED ORDER — BUPROPION HCL ER (SR) 150 MG PO TB12: ORAL_TABLET | ORAL | 2 refills | Status: DC

## 2019-07-30 NOTE — Progress Notes (Signed)
Virtual Visit via Telephone Note  I connected with Alice Bowers on 07/30/19 at 11:00 AM EST by telephone and verified that I am speaking with the correct person using two identifiers.   I discussed the limitations, risks, security and privacy concerns of performing an evaluation and management service by telephone and the availability of in person appointments. I also discussed with the patient that there may be a patient responsible charge related to this service. The patient expressed understanding and agreed to proceed.   History of Present Illness: Patient was evaluated by phone session.  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  She is keeping herself busy.  She does not have any more panic attacks and she has not use lorazepam since the last visit.  She is in therapy with [name].  She is sleeping good.  She had a Christmas shopping with her 67 year old granddaughter and that helped her mood a lot.  Her Thanksgiving was very quiet.  She did talk to her son who lives in [place].  Patient like to keep her current medicine since it is working well.  She does not need a new prescription of lorazepam.  Her energy level is good.  Her appetite is okay.  She is sleeping good.  She denies any feeling of hopelessness or worthlessness.  She has no tremors, shakes or any EPS.  Past Psychiatric History:Reviewed. H/O depression since 1995. At that time she was going through divorce. History of4 psychiatric hospitalization. Onesuicidal attempt by taking overdose on Seroquel. No historyof paranoia, hallucination or aggressive behavior. Tried Paxil, Zoloft, Remeron(weight gain)and Seroquel with limited response.     Psychiatric Specialty Exam: Physical Exam  Review of Systems  There were no vitals taken for this visit.There is no height or weight on file to calculate BMI.  General Appearance: NA  Eye Contact:  NA  Speech:  Clear and Coherent  Volume:   Normal  Mood:  Euthymic  Affect:  NA  Thought Process:  Goal Directed  Orientation:  Full (Time, Place, and Person)  Thought Content:  WDL  Suicidal Thoughts:  No  Homicidal Thoughts:  No  Memory:  Immediate;   Good Recent;   Good Remote;   Good  Judgement:  Intact  Insight:  Good  Psychomotor Activity:  NA  Concentration:  Concentration: Good and Attention Span: Good  Recall:  Good  Fund of Knowledge:  Good  Language:  Good  Akathisia:  No  Handed:  Right  AIMS (if indicated):     Assets:  Communication Skills Desire for Improvement Housing Resilience Talents/Skills Transportation  ADL's:  Intact  Cognition:  WNL  Sleep:   good      Assessment and Plan: Major depressive disorder, recurrent.  Generalized anxiety disorder.  Panic attacks.  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  She does not want to change her medication however does not need a new prescription of lorazepam since she has not used and she has no panic attacks since the last visit.  I encouraged to keep appointment with [name].  Discussed medication side effects at this time she does not have any tremors.  Continue Wellbutrin SR 300 mg in the morning and 150 at bedtime, Lexapro 20 mg daily and Lamictal 150 mg daily.  She has no rash or any itching.  Recommended  to call us back if she has any question of any concern.  Follow-up in 3 months.  Follow Up Instructions:    I discussed the assessment and treatment plan with the patient. The patient was provided an opportunity to ask questions and all were answered. The patient agreed with the plan and demonstrated an understanding of the instructions.   The patient was advised to call back or seek an in-person evaluation if the symptoms worsen or if the condition fails to improve as anticipated.  I provided 20 minutes of non-face-to-face time during this  encounter.   Kathlee Nations, MD

## 2019-08-01 ENCOUNTER — Other Ambulatory Visit: Payer: Self-pay

## 2019-08-01 ENCOUNTER — Encounter (HOSPITAL_COMMUNITY): Payer: Self-pay | Admitting: Licensed Clinical Social Worker

## 2019-08-01 ENCOUNTER — Ambulatory Visit (INDEPENDENT_AMBULATORY_CARE_PROVIDER_SITE_OTHER): Payer: Medicare Other | Admitting: Licensed Clinical Social Worker

## 2019-08-01 DIAGNOSIS — F330 Major depressive disorder, recurrent, mild: Secondary | ICD-10-CM | POA: Diagnosis not present

## 2019-08-01 NOTE — Progress Notes (Signed)
Virtual Visit via Telephone Note  I connected with Alice Bowers on 08/01/19 at  9:00 AM EST by telephone and verified that I am speaking with the correct person using two identifiers.    I discussed the limitations, risks, security and privacy concerns of performing an evaluation and management service by telephone and the availability of in person appointments. I also discussed with the patient that there may be a patient responsible charge related to this service. The patient expressed understanding and agreed to proceed.  Type of Therapy: Individual Therapy  Treatment Goals addressed: Diagnosis: MDD, bereavement  Interventions: CBT and Supportive  Summary: Alice Bowers is a 68 y.o. female who presents with MDD. mild  Suicidal/Homicidal: Nowithout intent/plan  Therapist Response: [name] met with clinician for an individual session. Hadassah discussed her psychiatric symptoms, her current life events and her goals for therapy. Alice Bowers shared thatshe has been doing well for the most part. She reports ongoing frustration about short staffing at her job. However, she reports that it seems they are getting the message and starting to bring people in closer to when Ki is supposed to get off in the morning so she does not have to stay late. She reported working an extra 16 hours during the previous pay period. Clinician discussed options and encouraged her to either continue speaking out about this issue or continue to stay late and collect the overtime.  Alice Bowers reports she and granddaughter went out and did Christmas shopping the other weekend and had a ball. She reported they spent a lot of money, but she was fine with it and so happy she could afford to treat her granddaughter. Clinician reflected the continuing importance of that relationship for both of them. Clinician also noted that it had been a long time since they spent a whole day together like that. [name] identified that it  was really a fun day and she enjoyed all of it.   Plan: Return again in 2-3 weeks.  Diagnosis: MDD, mild    I discussed the assessment and treatment plan with the patient. The patient was provided an opportunity to ask questions and all were answered. The patient agreed with the plan and demonstrated an understanding of the instructions.   The patient was advised to call back or seek an in-person evaluation if the symptoms worsen or if the condition fails to improve as anticipated.  I provided 45 minutes of non-face-to-face time during this encounter.   [name], LCSW

## 2019-08-22 ENCOUNTER — Other Ambulatory Visit: Payer: Self-pay

## 2019-08-22 ENCOUNTER — Ambulatory Visit (INDEPENDENT_AMBULATORY_CARE_PROVIDER_SITE_OTHER): Payer: Medicare PPO | Admitting: Licensed Clinical Social Worker

## 2019-08-22 ENCOUNTER — Encounter (HOSPITAL_COMMUNITY): Payer: Self-pay | Admitting: Licensed Clinical Social Worker

## 2019-08-22 DIAGNOSIS — F330 Major depressive disorder, recurrent, mild: Secondary | ICD-10-CM | POA: Diagnosis not present

## 2019-08-22 NOTE — Progress Notes (Signed)
Virtual Visit via Video Note  I connected with Alice Bowers on 08/22/19 at  9:00 AM EST by a video enabled telemedicine application and verified that I am speaking with the correct person using two identifiers.     I discussed the limitations of evaluation and management by telemedicine and the availability of in person appointments. The patient expressed understanding and agreed to proceed.  Type of Therapy: Individual Therapy  Treatment Goals addressed: Diagnosis: MDD, bereavement  Interventions: CBT and Supportive  Summary: Alice Bowers is a 69 y.o. female who presents with MDD. mild  Suicidal/Homicidal: Nowithout intent/plan  Therapist Response: Alice Bowers met with clinician for an individual session. Alice Bowers discussed her psychiatric symptoms, her current life events and her goals for therapy. Alice Bowers shared thatshehas been doing well for the most part. She reports ongoing frustration with some issues at work. However, she also reports that today she found out she would be getting a raise. Clinician discussed the pros and cons of the job and noted ongoing frustration with staffing issues. Clinician processed ability to function at night, which is no problem. Clinician noted that mood overall has been stable and pretty happy.   Plan: Return again in 2-3 weeks.  Diagnosis: MDD, mild    I discussed the assessment and treatment plan with the patient. The patient was provided an opportunity to ask questions and all were answered. The patient agreed with the plan and demonstrated an understanding of the instructions.   The patient was advised to call back or seek an in-person evaluation if the symptoms worsen or if the condition fails to improve as anticipated.  I provided 45 minutes of non-face-to-face time during this encounter.   [name], LCSW

## 2019-09-05 ENCOUNTER — Other Ambulatory Visit: Payer: Self-pay

## 2019-09-05 ENCOUNTER — Encounter (HOSPITAL_COMMUNITY): Payer: Self-pay | Admitting: Licensed Clinical Social Worker

## 2019-09-05 ENCOUNTER — Ambulatory Visit (INDEPENDENT_AMBULATORY_CARE_PROVIDER_SITE_OTHER): Payer: Medicare PPO | Admitting: Licensed Clinical Social Worker

## 2019-09-05 DIAGNOSIS — F330 Major depressive disorder, recurrent, mild: Secondary | ICD-10-CM

## 2019-09-05 NOTE — Progress Notes (Signed)
Virtual Visit via Video Note  I connected with Sharisa Toves on 09/05/19 at  9:00 AM EST by a video enabled telemedicine application and verified that I am speaking with the correct person using two identifiers.     I discussed the limitations of evaluation and management by telemedicine and the availability of in person appointments. The patient expressed understanding and agreed to proceed.  Type of Therapy: Individual Therapy  Treatment Goals addressed: Diagnosis: MDD, mild  Interventions: CBT and Supportive  Summary: Andi Layfield is a 69 y.o. female who presents with MDD. mild  Suicidal/Homicidal: Nowithout intent/plan  Therapist Response: [name] met with clinician for an individual session. Kenlie discussed her psychiatric symptoms, her current life events and her goals for therapy. Aracelis shared thatshehas been doing well overall. She processed some experiences and concerns at work. Clinician provided time and space for Shonte to discuss her experiences in work, juggling roles, and maintaining safety from [place]. 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  Clinician explored precautions and noted that Tannya has been given both doses of the vaccine and feels confident that she has made the right choice. Nuala also reported that she bought herself a new car, which is a huge step for her, due to her past year of financial struggles, getting through breast cancer, and continuing to grieve the loss of her daughter. Clinician reflected the challenges she has overcome this year and provided support and encouraged Daron to feel proud of her progress.    Plan: Return again in 2-3weeks.  Diagnosis: MDD, mild   I discussed the assessment and treatment plan with the patient. The patient was provided an opportunity to ask questions and all were answered. The patient agreed with the plan and demonstrated  an understanding of the instructions.   The patient was advised to call back or seek an in-person evaluation if the symptoms worsen or if the condition fails to improve as anticipated.  I provided 55 minutes of non-face-to-face time during this encounter.   [name], LCSW

## 2019-09-19 ENCOUNTER — Encounter (HOSPITAL_COMMUNITY): Payer: Self-pay | Admitting: Licensed Clinical Social Worker

## 2019-09-19 ENCOUNTER — Other Ambulatory Visit: Payer: Self-pay

## 2019-09-19 ENCOUNTER — Ambulatory Visit (INDEPENDENT_AMBULATORY_CARE_PROVIDER_SITE_OTHER): Payer: Medicare PPO | Admitting: Licensed Clinical Social Worker

## 2019-09-19 DIAGNOSIS — F330 Major depressive disorder, recurrent, mild: Secondary | ICD-10-CM | POA: Diagnosis not present

## 2019-09-19 NOTE — Progress Notes (Signed)
Virtual Visit via Video Note  I connected with Alice Bowers on 09/19/19 at  9:00 AM EST by a video enabled telemedicine application and verified that I am speaking with the correct person using two identifiers.    I discussed the limitations of evaluation and management by telemedicine and the availability of in person appointments. The patient expressed understanding and agreed to proceed.  Type of Therapy: Individual Therapy  Treatment Goals addressed: "I need somebody to talk to about my problems so I won't get snappy with the other people in my life"  Interventions: CBT and Supportive  Summary: Alice Bowers is a 69 y.o. female who presents with MDD. mild  Suicidal/Homicidal: Nowithout intent/plan  Therapist Response: Alice Bowers met with clinician for an individual session. Alice Bowers discussed her psychiatric symptoms, her current life events and her goals for therapy. Alice Bowers shared that today she feels stressed due to her job. Clinician explored the context of her stress and utilized CBT to explore her emotional and behavioral responses to these stressors. Clinician reflected the use of coping skills, deep breathing, remaining calm, and allowing natural consequences to assist in her coping. Clinician processed outcomes, noting that she still had to work very hard and a lot of things did not get done. However, she did not choose to get angry, blow up, or have a fit. 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 Clinician provided supportive therapy about Alice Bowers's experiences and used active listening to engage in discussion of Alice Bowers's history. Clinician and Maeven updated treatment plan. Goal is remaining the same, but some progress in using coping skills is noted.   Plan: Return again in 2-3weeks.  Diagnosis: MDD, mild   I discussed the assessment and treatment plan with the patient. The patient was provided an opportunity  to ask questions and all were answered. The patient agreed with the plan and demonstrated an understanding of the instructions.   The patient was advised to call back or seek an in-person evaluation if the symptoms worsen or if the condition fails to improve as anticipated.  I provided 45 minutes of non-face-to-face time during this encounter.   [name], LCSW

## 2019-10-03 ENCOUNTER — Other Ambulatory Visit: Payer: Self-pay

## 2019-10-03 ENCOUNTER — Encounter (HOSPITAL_COMMUNITY): Payer: Self-pay | Admitting: Licensed Clinical Social Worker

## 2019-10-03 ENCOUNTER — Ambulatory Visit (INDEPENDENT_AMBULATORY_CARE_PROVIDER_SITE_OTHER): Payer: Medicare PPO | Admitting: Licensed Clinical Social Worker

## 2019-10-03 DIAGNOSIS — F330 Major depressive disorder, recurrent, mild: Secondary | ICD-10-CM

## 2019-10-03 NOTE — Progress Notes (Signed)
Virtual Visit via Video Note  I connected with Alice Bowers on 10/03/19 at  9:00 AM EST by a video enabled telemedicine application and verified that I am speaking with the correct person using two identifiers.     I discussed the limitations of evaluation and management by telemedicine and the availability of in person appointments. The patient expressed understanding and agreed to proceed.  Type of Therapy: Individual Therapy  Treatment Goals addressed: "I need somebody to talk to about my problems so I won't get snappy with the other people in my life"  Interventions: CBT and Supportive  Summary: Alice Bowers is a 69 y.o. female who presents with MDD. mild  Suicidal/Homicidal: Nowithout intent/plan  Therapist Response: Alice Bowers met with clinician for an individual session. Alice Bowers discussed her psychiatric symptoms, her current life events and her goals for therapy. Jamilyn shared that she was feeling a little nervous about an upcoming meeting she has with her boss and someone from HR. Clinician utilized MI OARS to reflect and summarize thoughts and feelings. Clinician utilized CBT to examine best case scenario and worst case scenario, as well as most likely case scenario. Clinician validated coping skills for all three scenarios and explored any other reasons to be concerned for her job security. 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 Clinician identified the importance of communication, respect, and holding people accountable.    Plan: Return again in 2-3weeks.  Diagnosis: MDD, mild    I discussed the assessment and treatment plan with the patient. The patient was provided an opportunity to ask questions and all were answered. The patient agreed with the plan and demonstrated an understanding of the instructions.   The patient was advised to call back  or seek an in-person evaluation if the symptoms worsen or if the condition fails to improve as anticipated.  I provided 45 minutes of non-face-to-face time during this encounter.   [name], LCSW

## 2019-10-06 ENCOUNTER — Ambulatory Visit
Admission: EM | Admit: 2019-10-06 | Discharge: 2019-10-06 | Disposition: A | Discharge status: Home / Self Care | Payer: Medicare PPO

## 2019-10-06 ENCOUNTER — Other Ambulatory Visit: Payer: Self-pay

## 2019-10-06 ENCOUNTER — Ambulatory Visit (INDEPENDENT_AMBULATORY_CARE_PROVIDER_SITE_OTHER): Payer: Medicare PPO

## 2019-10-06 DIAGNOSIS — W19XXXA Unspecified fall, initial encounter: Secondary | ICD-10-CM

## 2019-10-06 DIAGNOSIS — M25511 Pain in right shoulder: Secondary | ICD-10-CM | POA: Diagnosis not present

## 2019-10-06 DIAGNOSIS — S4991XA Unspecified injury of right shoulder and upper arm, initial encounter: Secondary | ICD-10-CM

## 2019-10-06 HISTORY — DX: Malignant (primary) neoplasm, unspecified: C80.1

## 2019-10-06 NOTE — ED Provider Notes (Signed)
[place]   [id number] 10/06/19 Arrival Time: [id number]  CC: RT shoulder pain  SUBJECTIVE: History from: patient. Alice Bowers is a 69 y.o. female complains of RT shoulder and nose pain x 1 day.  Symptoms began after trip and fall over chair at home.  States she hit nose on floor, is unsure how she injured her shoulder.  Localizes the pain to the outside of RT shoulder.  Describes the pain as intermittent and sharp in character. Pain is 5/10.  Has tried OTC medications and ice with minimal relief.  Symptoms are made worse with shoulder ROM.  Denies similar symptoms in the past.  Denies fever, chills, erythema, ecchymosis, effusion, CP, SOB, weakness, numbness and tingling.  ROS: As per HPI.  All other pertinent ROS negative.     Past Medical History:  Diagnosis Date  . Anxiety   . Arthritis   . Cancer [facility])    R breast cancer  . Depression   . Heart murmur   . High blood pressure   . High cholesterol   . Kidney stones   . Macular degeneration of left eye   . Macular degeneration of right eye   . Obstructive sleep apnea   . Restless leg syndrome    Past Surgical History:  Procedure Laterality Date  . ABDOMINAL HYSTERECTOMY    . APPENDECTOMY    . arthroscopy left shoulder    . CHOLECYSTECTOMY    . joint fusion bilateral thumbs    . MASTECTOMY Bilateral 2019  . ORIF right ankle    . REPLACEMENT TOTAL KNEE BILATERAL     Allergies  Allergen Reactions  . Azo [Phenazopyridine] Hives  . Pyridium [Phenazopyridine Hcl] Hives  . Requip [Ropinirole Hcl] Swelling  . Betadine [Povidone Iodine] Other (See Comments)    Burn rash  . Cephalosporins   . Demerol [Meperidine]   . Edta [Edetic Acid]   . Macrobid [Nitrofurantoin Macrocrystal]   . Sulfa Antibiotics   . Ceclor [Cefaclor] Rash  . Dynacirc [Isradipine] Rash  . Keflex [Cephalexin] Rash  . Voltaren [Diclofenac Sodium] Anxiety   No current facility-administered medications on file prior to encounter.    Current Outpatient Medications on File Prior to Encounter  Medication Sig Dispense Refill  . albuterol (ACCUNEB) 1.25 MG/3ML nebulizer solution Take 1 ampule by nebulization every 6 (six) hours as needed for wheezing.    . Albuterol Sulfate 108 (90 Base) MCG/ACT AEPB Inhale into the lungs.    Marland Kitchen amLODipine (NORVASC) 2.5 MG tablet     . anastrozole (ARIMIDEX) 1 MG tablet Take 1 mg by mouth daily.    Marland Kitchen atorvastatin (LIPITOR) 40 MG tablet Take 40 mg by mouth daily.    Marland Kitchen buPROPion (WELLBUTRIN SR) 150 MG 12 hr tablet TAKE 2 TABLETS BY MOUTH EVERY MORNING AND 1 TABLET AT BEDTIME 90 tablet 2  . escitalopram (LEXAPRO) 20 MG tablet Take 1 tablet (20 mg total) by mouth daily. 30 tablet 2  . fluticasone furoate-vilanterol (BREO ELLIPTA) 200-25 MCG/INH AEPB Inhale 1 puff into the lungs daily.    . folic acid (FOLVITE) 1 MG tablet Take 1 mg by mouth daily.    . furosemide (LASIX) 20 MG tablet TAKE ONE HALF TABLET (10 MG DOSE) BY MOUTH DAILY FOR 30 DAYS    . gabapentin (NEURONTIN) 300 MG capsule Take 300 mg by mouth 2 (two) times daily.    . hydrochlorothiazide (HYDRODIURIL) 25 MG tablet Take 25 mg by mouth daily.    Marland Kitchen lamoTRIgine (LAMICTAL)  150 MG tablet Take 1 tablet (150 mg total) by mouth daily. 30 tablet 2  . LORazepam (ATIVAN) 0.5 MG tablet Take 1 tablet (0.5 mg total) by mouth daily as needed for anxiety (panic attack). 15 tablet 0  . losartan (COZAAR) 100 MG tablet Take 100 mg by mouth daily.    . pramipexole (MIRAPEX) 0.5 MG tablet Take 0.5 mg by mouth 2 (two) times daily.    . [DISCONTINUED] allopurinol (ZYLOPRIM) 300 MG tablet Take 300 mg by mouth daily.     Social History   Socioeconomic History  . Marital status: Widowed    Spouse name: Not on file  . Number of children: Not on file  . Years of education: Not on file  . Highest education level: Not on file  Occupational History  . Not on file  Tobacco Use  . Smoking status: Current Some Day Smoker    Packs/day: 1.00    Years: 20.00     Pack years: 20.00    Types: Cigarettes  . Smokeless tobacco: Never Used  Substance and Sexual Activity  . Alcohol use: Not Currently  . Drug use: No  . Sexual activity: Not Currently  Other Topics Concern  . Not on file  Social History Narrative  . Not on file   Social Determinants of Health   Financial Resource Strain:   . Difficulty of Paying Living Expenses: Not on file  Food Insecurity:   . Worried About Charity fundraiser in the Last Year: Not on file  . Ran Out of Food in the Last Year: Not on file  Transportation Needs: No Transportation Needs  . Lack of Transportation (Medical): No  . Lack of Transportation (Non-Medical): No  Physical Activity:   . Days of Exercise per Week: Not on file  . Minutes of Exercise per Session: Not on file  Stress:   . Feeling of Stress : Not on file  Social Connections:   . Frequency of Communication with Friends and Family: Not on file  . Frequency of Social Gatherings with Friends and Family: Not on file  . Attends Religious Services: Not on file  . Active Member of Clubs or Organizations: Not on file  . Attends Archivist Meetings: Not on file  . Marital Status: Not on file  Intimate Partner Violence:   . Fear of Current or Ex-Partner: Not on file  . Emotionally Abused: Not on file  . Physically Abused: Not on file  . Sexually Abused: Not on file   Family History  Problem Relation Age of Onset  . Depression Mother   . Hyperlipidemia Mother   . Hypertension Mother   . Depression Brother   . Hyperlipidemia Father   . Hypertension Father   . Asthma Father     OBJECTIVE:  Vitals:   10/06/19 0856  BP: (!) 167/83  Pulse: 83  Resp: 17  Temp: 97.8 F (36.6 C)  TempSrc: Oral  SpO2: 95%    General appearance: ALERT; in no acute distress.  Head: NCAT ENT: PERRL, EOMI grossly, light ecchymosis under RT eye; nose without deformity, nares patent without epistaxis; oropharynx clear, teeth intact Lungs: Normal  respiratory effort; CTAB CV: RRR Musculoskeletal: RT shoulder Inspection: Skin warm, dry, clear and intact without obvious erythema, effusion, or ecchymosis.  Palpation: Diffusely TTP over lateral and posterior shoulder ROM: FROM passive slowly Strength: 5/5 shld abduction, 5/5 shld adduction, 5/5 elbow flexion, 5/5 elbow extension Skin: warm and dry Neurologic: Ambulates without difficulty;  Sensation intact about the upper extremities Psychological: alert and cooperative; normal mood and affect  DIAGNOSTIC STUDIES:  DG Shoulder Right  Result Date: 10/06/2019 CLINICAL DATA:  Fall, right shoulder pain EXAM: RIGHT SHOULDER - 2+ VIEW COMPARISON:  None. FINDINGS: No fracture or dislocation is seen. The joint spaces are preserved. Surgical clips along the right lateral chest wall/axilla. Visualized right lung is clear. IMPRESSION: Negative. Electronically Signed   By: Julian Hy M.D.   On: 10/06/2019 09:33    X-rays negative for bony abnormalities including fracture, or dislocation.  No soft tissue swelling.    I have reviewed the x-rays myself and the radiologist interpretation. I am in agreement with the radiologist interpretation.     ASSESSMENT & PLAN:  1. Injury of right shoulder, initial encounter   2. Acute pain of right shoulder   3. Fall, initial encounter    X-rays did not show fracture or dislocation  Continue conservative management of rest, ice, and gentle stretches Take mobic as prescribed for pain and inflammation Work note given Follow up with PCP or orthopedist if symptoms persist Return or go to the ER if you have any new or worsening symptoms (fever, chills, chest pain, bruising, swelling, redness, worsening symptoms despite medication, etc...)   Reviewed expectations re: course of current medical issues. Questions answered. Outlined signs and symptoms indicating need for more acute intervention. Patient verbalized understanding. After Visit Summary  given.    Lestine Box, PA-C 10/06/19 1002

## 2019-10-06 NOTE — Discharge Instructions (Signed)
X-rays did not show fracture or dislocation  Continue conservative management of rest, ice, and gentle stretches Take mobic as prescribed for pain and inflammation Work note given Follow up with PCP or orthopedist if symptoms persist Return or go to the ER if you have any new or worsening symptoms (fever, chills, chest pain, bruising, swelling, redness, worsening symptoms despite medication, etc...)

## 2019-10-06 NOTE — ED Triage Notes (Signed)
Tripped over chair yesterday causing fall.  Fell and hit nose.  Feels like it may be broken.  Primary concern is R shoulder pain starting this morning. Decreased ROM in shoulder d/t pain. ROM in elbow WNL.  No numbness or tingling in RUE. Grips equal.

## 2019-10-17 ENCOUNTER — Encounter (HOSPITAL_COMMUNITY): Payer: Self-pay | Admitting: Licensed Clinical Social Worker

## 2019-10-17 ENCOUNTER — Ambulatory Visit (INDEPENDENT_AMBULATORY_CARE_PROVIDER_SITE_OTHER): Payer: Medicare PPO | Admitting: Licensed Clinical Social Worker

## 2019-10-17 ENCOUNTER — Other Ambulatory Visit: Payer: Self-pay

## 2019-10-17 DIAGNOSIS — F331 Major depressive disorder, recurrent, moderate: Secondary | ICD-10-CM

## 2019-10-17 NOTE — Progress Notes (Signed)
Virtual Visit via Video Note  I connected with Kiernan Farkas on 10/17/19 at  8:00 AM EST by a video enabled telemedicine application and verified that I am speaking with the correct person using two identifiers.     I discussed the limitations of evaluation and management by telemedicine and the availability of in person appointments. The patient expressed understanding and agreed to proceed.  Type of Therapy: Individual Therapy  Treatment Goals addressed:"I need somebody to talk to about my problems so I won't get snappy with the other people in my life"  Interventions: MI and Supportive  Summary: Luddie Boghosian is a 69y.o. female who presents with MDD. moderate  Suicidal/Homicidal: Nowithout intent/plan  Therapist Response: [name] met with clinician for an individual session. Jazell discussed her psychiatric symptoms, her current life events and her goals for therapy. Gerlene reported that she has been very upset and depressed the past few days over her job. Clinician utilized MI OARS to reflect and summarize thoughts and feelings, concerns over possibly losing her job, and worry about making car payments. Clinician processed options for reaching out to HR. Clinician also brainstormed other options for job searching in order to have a back up plan. Clinician provided supportive feedback to remind Lenise that she has been through harder times than this and that she will continue to move forward in her life the way she sees fit. Clinician provided time and space for Porfiria to go through emotions and to process this experience.    Plan: Return again in 2-3weeks.  Diagnosis: MDD, moderate     I discussed the assessment and treatment plan with the patient. The patient was provided an opportunity to ask questions and all were answered. The patient agreed with the plan and demonstrated an understanding of the instructions.   The patient was advised to call back or seek an  in-person evaluation if the symptoms worsen or if the condition fails to improve as anticipated.  I provided 45 minutes of non-face-to-face time during this encounter.   [name], LCSW

## 2019-10-28 ENCOUNTER — Ambulatory Visit (INDEPENDENT_AMBULATORY_CARE_PROVIDER_SITE_OTHER): Payer: Medicare PPO | Admitting: Psychiatry

## 2019-10-28 ENCOUNTER — Encounter (HOSPITAL_COMMUNITY): Payer: Self-pay | Admitting: Psychiatry

## 2019-10-28 ENCOUNTER — Other Ambulatory Visit: Payer: Self-pay

## 2019-10-28 DIAGNOSIS — F411 Generalized anxiety disorder: Secondary | ICD-10-CM | POA: Diagnosis not present

## 2019-10-28 DIAGNOSIS — F410 Panic disorder [episodic paroxysmal anxiety] without agoraphobia: Secondary | ICD-10-CM | POA: Diagnosis not present

## 2019-10-28 DIAGNOSIS — F331 Major depressive disorder, recurrent, moderate: Secondary | ICD-10-CM | POA: Diagnosis not present

## 2019-10-28 MED ORDER — LORAZEPAM 0.5 MG PO TABS: 0.5000 mg | ORAL_TABLET | Freq: Every day | ORAL | 0 refills | Status: AC | PRN

## 2019-10-28 MED ORDER — LAMOTRIGINE 150 MG PO TABS: 150.0000 mg | ORAL_TABLET | Freq: Every day | ORAL | 2 refills | Status: DC

## 2019-10-28 MED ORDER — ESCITALOPRAM OXALATE 20 MG PO TABS: 20.0000 mg | ORAL_TABLET | Freq: Every day | ORAL | 2 refills | Status: DC

## 2019-10-28 MED ORDER — BUPROPION HCL ER (SR) 150 MG PO TB12: ORAL_TABLET | ORAL | 2 refills | Status: DC

## 2019-10-28 NOTE — Progress Notes (Signed)
Virtual Visit via Telephone Note  I connected with Alice Bowers on 10/28/19 at 10:40 AM EDT by telephone and verified that I am speaking with the correct person using two identifiers.   I discussed the limitations, risks, security and privacy concerns of performing an evaluation and management service by telephone and the availability of in person appointments. I also discussed with the patient that there may be a patient responsible charge related to this service. The patient expressed understanding and agreed to proceed.   History of Present Illness: Patient was evaluated by phone session.  She reported increased anxiety and having panic attacks since she is frustrated because of work.  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  She had talk to human resources and hoping this issue will be addressed.  Patient told she is not happy about the situation but hoping that this can resolve with the HR and supervisor.  She is sleeping good.  She is in therapy with [name].  She admitted taking lorazepam in the past few months more frequently because of having anxiety and panic attack.  She is compliant with Lamictal, Wellbutrin and Lexapro.  She denies any tremors, shakes, rash and itching.  She is in therapy with [name] and that is going well.  Her energy level is okay.  Her appetite is okay.  She denies any suicidal thoughts.  She like to keep her current medication.   Past Psychiatric History:Reviewed. H/O depression since 1995 when going through divorce. H/O 4 inpatient treatment. H/O suicidal attempt by taking overdose on Seroquel. No h/o paranoia, psychosis or aggressive behavior. Tried Paxil, Zoloft, Remeron(weight gain)and Seroquel with limited response.    Psychiatric Specialty Exam: Physical Exam  Review of Systems  There were no vitals taken for this visit.There is no height or weight on  file to calculate BMI.  General Appearance: NA  Eye Contact:  NA  Speech:  Clear and Coherent and Slow  Volume:  Normal  Mood:  Depressed  Affect:  NA  Thought Process:  Goal Directed  Orientation:  Full (Time, Place, and Person)  Thought Content:  Logical  Suicidal Thoughts:  No  Homicidal Thoughts:  No  Memory:  Immediate;   Good Recent;   Good Remote;   Good  Judgement:  Good  Insight:  Present  Psychomotor Activity:  NA  Concentration:  Concentration: Good and Attention Span: Good  Recall:  Good  Fund of Knowledge:  Good  Language:  Good  Akathisia:  No  Handed:  Right  AIMS (if indicated):     Assets:  Communication Skills Desire for Improvement Housing Social Support Talents/Skills Transportation  ADL's:  Intact  Cognition:  WNL  Sleep:   Okay.      Assessment and Plan: Major depressive disorder, recurrent.  Generalized anxiety disorder.  Panic attack.  Discuss recent stress at work.  Reassurance given.  Patient is hoping talking to the HR may resolve the issue.  However she will need a new prescription since used her last refill of lorazepam.  She has no tremors, shakes or any EPS.  Continue Wellbutrin SR 300 mg in the morning and 150 at bedtime, Lexapro 20 mg daily, Lamictal 150 mg daily and lorazepam 0.5 mg as needed for panic attack #15.  Encouraged to continue therapy with [name].  Recommended to call us back if she has any question or any concern.  Follow-up in 3 months.  Follow Up Instructions:    I discussed the assessment and treatment plan with the patient. The patient was provided an opportunity to ask questions and all were answered. The patient agreed with the plan and demonstrated an understanding of the instructions.   The patient was advised to call back or seek an in-person evaluation if the symptoms worsen or if the condition fails to improve as anticipated.  I provided 20 minutes of non-face-to-face time during this encounter.   Kathlee Nations, MD

## 2019-10-31 ENCOUNTER — Other Ambulatory Visit: Payer: Self-pay

## 2019-10-31 ENCOUNTER — Encounter (HOSPITAL_COMMUNITY): Payer: Self-pay | Admitting: Licensed Clinical Social Worker

## 2019-10-31 ENCOUNTER — Ambulatory Visit (INDEPENDENT_AMBULATORY_CARE_PROVIDER_SITE_OTHER): Payer: Medicare PPO | Admitting: Licensed Clinical Social Worker

## 2019-10-31 DIAGNOSIS — F331 Major depressive disorder, recurrent, moderate: Secondary | ICD-10-CM

## 2019-10-31 NOTE — Progress Notes (Signed)
Virtual Visit via Video Note  I connected with Alice Bowers on 10/31/19 at  9:00 AM EDT by a video enabled telemedicine application and verified that I am speaking with the correct person using two identifiers.     I discussed the limitations of evaluation and management by telemedicine and the availability of in person appointments. The patient expressed understanding and agreed to proceed.    Type of Therapy: Individual Therapy   Treatment Goals addressed: "I need somebody to talk to about my problems so I won't get snappy with the other people in my life"   Interventions: MI and Supportive   Summary: Alice Bowers is a 69 y.o. female who presents with MDD. moderate   Suicidal/Homicidal: Nowithout intent/plan   Therapist Response: [name] met with clinician for an individual session. Alice Bowers discussed her psychiatric symptoms, her current life events and her goals for therapy. Alice Bowers reports she continues to struggle at work. Clinician processed updates at work and identified progress in applying for new jobs. Clinician utilized MI OARS to reflect and summarize thoughts and feelings about options available. Clinician discussed ways to help herself feel better and different coping skills, even when the situation feels bad. Clinician processed through "silver linings".      Plan: Return again in 2-3 weeks.   Diagnosis: MDD, moderate    I discussed the assessment and treatment plan with the patient. The patient was provided an opportunity to ask questions and all were answered. The patient agreed with the plan and demonstrated an understanding of the instructions.   The patient was advised to call back or seek an in-person evaluation if the symptoms worsen or if the condition fails to improve as anticipated.  I provided 45 minutes of non-face-to-face time during this encounter.   Alice Curling, LCSW

## 2019-11-21 ENCOUNTER — Ambulatory Visit (INDEPENDENT_AMBULATORY_CARE_PROVIDER_SITE_OTHER): Payer: Medicare PPO | Admitting: Licensed Clinical Social Worker

## 2019-11-21 ENCOUNTER — Other Ambulatory Visit: Payer: Self-pay

## 2019-11-21 ENCOUNTER — Encounter (HOSPITAL_COMMUNITY): Payer: Self-pay | Admitting: Licensed Clinical Social Worker

## 2019-11-21 DIAGNOSIS — F331 Major depressive disorder, recurrent, moderate: Secondary | ICD-10-CM | POA: Diagnosis not present

## 2019-11-21 NOTE — Progress Notes (Signed)
Virtual Visit via Video Note  I connected with Chloeann Alfred on 11/21/19 at  8:00 AM EDT by a video enabled telemedicine application and verified that I am speaking with the correct person using two identifiers.    I discussed the limitations of evaluation and management by telemedicine and the availability of in person appointments. The patient expressed understanding and agreed to proceed.  Type of Therapy: Individual Therapy   Treatment Goals addressed: "I need somebody to talk to about my problems so I won't get snappy with the other people in my life"   Interventions: MI and Supportive   Summary: Laelle Bridgett is a 69 y.o. female who presents with MDD. moderate   Suicidal/Homicidal: Nowithout intent/plan   Therapist Response: [name] met with clinician for an individual session. Emileigh discussed her psychiatric symptoms, her current life events and her goals for therapy. Emmalee identified ongoing struggles at work, problems getting to do what she wants to do. Clinician utilized MI OARS to reflect and summarize thoughts and feelings. Clinician processed challenges with job, frustration that she is not able to do what she was hired to do. Clinician also explored more positive thoughts about the job that may help her cope with the daily operations. Clinician also discussed plans to apply for other jobs, but none have panned out yet. Clinician explored plans for the summer and identified the importance of taking time off to recover and rejuvenate her spirit.     Plan: Return again in 2-3 weeks.   Diagnosis: MDD, moderate    I discussed the assessment and treatment plan with the patient. The patient was provided an opportunity to ask questions and all were answered. The patient agreed with the plan and demonstrated an understanding of the instructions.   The patient was advised to call back or seek an in-person evaluation if the symptoms worsen or if the condition fails to improve as  anticipated.  I provided 45 minutes of non-face-to-face time during this encounter.   [name], LCSW

## 2019-11-26 ENCOUNTER — Other Ambulatory Visit (HOSPITAL_COMMUNITY): Payer: Self-pay | Admitting: Psychiatry

## 2019-11-26 DIAGNOSIS — F411 Generalized anxiety disorder: Secondary | ICD-10-CM

## 2019-11-26 DIAGNOSIS — F331 Major depressive disorder, recurrent, moderate: Secondary | ICD-10-CM

## 2019-12-05 ENCOUNTER — Other Ambulatory Visit: Payer: Self-pay

## 2019-12-05 ENCOUNTER — Ambulatory Visit (INDEPENDENT_AMBULATORY_CARE_PROVIDER_SITE_OTHER): Payer: Medicare PPO | Admitting: Licensed Clinical Social Worker

## 2019-12-05 ENCOUNTER — Encounter (HOSPITAL_COMMUNITY): Payer: Self-pay | Admitting: Licensed Clinical Social Worker

## 2019-12-05 DIAGNOSIS — F331 Major depressive disorder, recurrent, moderate: Secondary | ICD-10-CM

## 2019-12-05 NOTE — Progress Notes (Signed)
Virtual Visit via Video Note  I connected with Alice Bowers on 12/05/19 at  9:00 AM EDT by a video enabled telemedicine application and verified that I am speaking with the correct person using two identifiers.    I discussed the limitations of evaluation and management by telemedicine and the availability of in person appointments. The patient expressed understanding and agreed to proceed.  Type of Therapy: Individual Therapy   Treatment Goals addressed: "I need somebody to talk to about my problems so I won't get snappy with the other people in my life"   Interventions: MI and Supportive   Summary: Alice Bowers is a 69 y.o. female who presents with MDD. moderate   Suicidal/Homicidal: Nowithout intent/plan   Therapist Response: Alice Bowers met with clinician for an individual session. Alice Bowers discussed her psychiatric symptoms, her current life events and her goals for therapy. Alice Bowers identified ongoing struggles at work, problems getting to do what she wants to do. Clinician utilized MI OARS to reflect and summarize thoughts and feelings. Clinician processed challenges with job, frustration that she is not able to do what she was hired to do. Clinician also explored more positive thoughts about the job that may help her cope with the daily operations. Clinician also discussed plans to apply for other jobs, but none have panned out yet. Clinician explored plans for the summer and identified the importance of taking time off to recover and rejuvenate her spirit.     Plan: Return again in 2-3 weeks.   Diagnosis: MDD, moderate    I discussed the assessment and treatment plan with the patient. The patient was provided an opportunity to ask questions and all were answered. The patient agreed with the plan and demonstrated an understanding of the instructions.   The patient was advised to call back or seek an in-person evaluation if the symptoms worsen or if the condition fails to improve as  anticipated.  I provided 45 minutes of non-face-to-face time during this encounter.   [name], LCSW

## 2019-12-19 ENCOUNTER — Other Ambulatory Visit: Payer: Self-pay

## 2019-12-19 ENCOUNTER — Ambulatory Visit (INDEPENDENT_AMBULATORY_CARE_PROVIDER_SITE_OTHER): Payer: Medicare PPO | Admitting: Licensed Clinical Social Worker

## 2019-12-19 ENCOUNTER — Encounter (HOSPITAL_COMMUNITY): Payer: Self-pay | Admitting: Licensed Clinical Social Worker

## 2019-12-19 DIAGNOSIS — F331 Major depressive disorder, recurrent, moderate: Secondary | ICD-10-CM | POA: Diagnosis not present

## 2019-12-19 NOTE — Progress Notes (Signed)
Virtual Visit via Video Note  I connected with Alice Bowers on 12/19/19 at  9:00 AM EDT by a video enabled telemedicine application and verified that I am speaking with the correct person using two identifiers.     I discussed the limitations of evaluation and management by telemedicine and the availability of in person appointments. The patient expressed understanding and agreed to proceed.  Type of Therapy: Individual Therapy   Treatment Goals addressed: "I need somebody to talk to about my problems so I won't get snappy with the other people in my life"   Interventions: MI   Summary: Alice Bowers is a 69 y.o. female who presents with MDD. moderate   Suicidal/Homicidal: Nowithout intent/plan   Therapist Response: [name] met with clinician for an individual session. Kitt discussed her psychiatric symptoms, her current life events and her goals for therapy. Alice Bowers reports she has been very frustrated and aggravated this week due to work issues. Clinician utilized MI OARS to reflect and summarize issues with short staffing, inability to complete tasks, and getting no support from her management. Clinician explored ways to reduce stress and to make the best of her situation, including controlling her own impulses and moods through deep breathing, mindfulness, and good communication skills with those around her.    Plan: Return again in 2-3 weeks.   Diagnosis: MDD, moderate     I discussed the assessment and treatment plan with the patient. The patient was provided an opportunity to ask questions and all were answered. The patient agreed with the plan and demonstrated an understanding of the instructions.   The patient was advised to call back or seek an in-person evaluation if the symptoms worsen or if the condition fails to improve as anticipated.  I provided 45 minutes of non-face-to-face time during this encounter.   [name], LCSW

## 2020-01-02 ENCOUNTER — Encounter (HOSPITAL_COMMUNITY): Payer: Self-pay | Admitting: Licensed Clinical Social Worker

## 2020-01-02 ENCOUNTER — Other Ambulatory Visit: Payer: Self-pay

## 2020-01-02 ENCOUNTER — Ambulatory Visit (INDEPENDENT_AMBULATORY_CARE_PROVIDER_SITE_OTHER): Payer: Medicare PPO | Admitting: Licensed Clinical Social Worker

## 2020-01-02 DIAGNOSIS — F330 Major depressive disorder, recurrent, mild: Secondary | ICD-10-CM | POA: Diagnosis not present

## 2020-01-02 NOTE — Progress Notes (Signed)
Virtual Visit via Video Note  I connected with Rashanda Magloire on 01/02/20 at  9:00 AM EDT by a video enabled telemedicine application and verified that I am speaking with the correct person using two identifiers.     I discussed the limitations of evaluation and management by telemedicine and the availability of in person appointments. The patient expressed understanding and agreed to proceed.  Type of Therapy: Individual Therapy   Treatment Goals addressed: "I need somebody to talk to about my problems so I won't get snappy with the other people in my life"   Interventions: MI   Summary: Elanor Cale is a 69 y.o. female who presents with MDD. mild   Suicidal/Homicidal: Nowithout intent/plan   Therapist Response: [name] met with clinician for an individual session. Ashiya discussed her psychiatric symptoms, her current life events and her goals for therapy. Mabeline shared that she is doing a little better this week due to an upcoming vacation starting this weekend through next week. Clinician utilized MI OARS to reflect and summarize thoughts and feelings about getting away, excitement to see some different surroundings, and to spend time with her sister. 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 Clinician explored family relationships and interactions with granddaughter, step-granddaughter, and son in law. Clinician noted improvement in mood overall.     Plan: Return again in 2-3 weeks.   Diagnosis: MDD, mild    I discussed the assessment and treatment plan with the patient. The patient was provided an opportunity to ask questions and all were answered. The patient agreed with the plan and demonstrated an understanding of the instructions.   The patient was advised to call back or seek an in-person evaluation if the symptoms worsen or if the condition fails to improve as  anticipated.  I provided 45 minutes of non-face-to-face time during this encounter.   [name], LCSW

## 2020-01-16 ENCOUNTER — Ambulatory Visit (INDEPENDENT_AMBULATORY_CARE_PROVIDER_SITE_OTHER): Payer: Medicare PPO | Admitting: Licensed Clinical Social Worker

## 2020-01-16 ENCOUNTER — Other Ambulatory Visit: Payer: Self-pay

## 2020-01-16 ENCOUNTER — Encounter (HOSPITAL_COMMUNITY): Payer: Self-pay | Admitting: Licensed Clinical Social Worker

## 2020-01-16 DIAGNOSIS — F330 Major depressive disorder, recurrent, mild: Secondary | ICD-10-CM | POA: Diagnosis not present
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
## 2020-01-25 ENCOUNTER — Other Ambulatory Visit: Payer: Self-pay

## 2020-01-25 ENCOUNTER — Ambulatory Visit
Admission: EM | Admit: 2020-01-25 | Discharge: 2020-01-25 | Disposition: A | Discharge status: Home / Self Care | Payer: Medicare PPO | Source: Home / Self Care

## 2020-01-25 ENCOUNTER — Emergency Department (HOSPITAL_COMMUNITY): Payer: Medicare PPO

## 2020-01-25 ENCOUNTER — Encounter (HOSPITAL_COMMUNITY): Payer: Self-pay

## 2020-01-25 ENCOUNTER — Emergency Department (HOSPITAL_COMMUNITY)
Admission: EM | Admit: 2020-01-25 | Discharge: 2020-01-25 | Disposition: A | Discharge status: Home / Self Care | Payer: Medicare PPO | Attending: Emergency Medicine | Admitting: Emergency Medicine

## 2020-01-25 DIAGNOSIS — R0602 Shortness of breath: Secondary | ICD-10-CM | POA: Diagnosis present

## 2020-01-25 DIAGNOSIS — J4541 Moderate persistent asthma with (acute) exacerbation: Secondary | ICD-10-CM | POA: Diagnosis not present

## 2020-01-25 DIAGNOSIS — Z79899 Other long term (current) drug therapy: Secondary | ICD-10-CM | POA: Insufficient documentation

## 2020-01-25 DIAGNOSIS — F17210 Nicotine dependence, cigarettes, uncomplicated: Secondary | ICD-10-CM | POA: Insufficient documentation

## 2020-01-25 DIAGNOSIS — Z20822 Contact with and (suspected) exposure to covid-19: Secondary | ICD-10-CM | POA: Diagnosis not present

## 2020-01-25 LAB — CBC WITH DIFFERENTIAL/PLATELET
Abs Immature Granulocytes: 0.1 10*3/uL — ABNORMAL HIGH (ref 0.00–0.07)
Basophils Absolute: 0.1 10*3/uL (ref 0.0–0.1)
Basophils Relative: 1 %
Eosinophils Absolute: 0.3 10*3/uL (ref 0.0–0.5)
Eosinophils Relative: 3 %
HCT: 37.9 % (ref 36.0–46.0)
Hemoglobin: 12.5 g/dL (ref 12.0–15.0)
Immature Granulocytes: 1 %
Lymphocytes Relative: 13 %
Lymphs Abs: 1.6 10*3/uL (ref 0.7–4.0)
MCH: 30.3 pg (ref 26.0–34.0)
MCHC: 33 g/dL (ref 30.0–36.0)
MCV: 91.8 fL (ref 80.0–100.0)
Monocytes Absolute: 1.4 10*3/uL — ABNORMAL HIGH (ref 0.1–1.0)
Monocytes Relative: 11 %
Neutro Abs: 8.5 10*3/uL — ABNORMAL HIGH (ref 1.7–7.7)
Neutrophils Relative %: 71 %
Platelets: 264 10*3/uL (ref 150–400)
RBC: 4.13 MIL/uL (ref 3.87–5.11)
RDW: 14.4 % (ref 11.5–15.5)
WBC: 12 10*3/uL — ABNORMAL HIGH (ref 4.0–10.5)
nRBC: 0 % (ref 0.0–0.2)

## 2020-01-25 LAB — COMPREHENSIVE METABOLIC PANEL
ALT: 16 U/L (ref 0–44)
AST: 20 U/L (ref 15–41)
Albumin: 4.4 g/dL (ref 3.5–5.0)
Alkaline Phosphatase: 126 U/L (ref 38–126)
Anion gap: 11 (ref 5–15)
BUN: 21 mg/dL (ref 8–23)
CO2: 24 mmol/L (ref 22–32)
Calcium: 9.7 mg/dL (ref 8.9–10.3)
Chloride: 104 mmol/L (ref 98–111)
Creatinine, Ser: 1.5 mg/dL — ABNORMAL HIGH (ref 0.44–1.00)
GFR calc Af Amer: 41 mL/min — ABNORMAL LOW (ref >60)
GFR calc non Af Amer: 35 mL/min — ABNORMAL LOW (ref >60)
Glucose, Bld: 114 mg/dL — ABNORMAL HIGH (ref 70–99)
Potassium: 3.4 mmol/L — ABNORMAL LOW (ref 3.5–5.1)
Sodium: 139 mmol/L (ref 135–145)
Total Bilirubin: 0.4 mg/dL (ref 0.3–1.2)
Total Protein: 6.8 g/dL (ref 6.5–8.1)

## 2020-01-25 LAB — TROPONIN I (HIGH SENSITIVITY): Troponin I (High Sensitivity): 5 ng/L (ref <18)

## 2020-01-25 LAB — BRAIN NATRIURETIC PEPTIDE: B Natriuretic Peptide: 68 pg/mL (ref 0.0–100.0)

## 2020-01-25 LAB — SARS CORONAVIRUS 2 BY RT PCR (HOSPITAL ORDER, PERFORMED IN ~~LOC~~ HOSPITAL LAB): SARS Coronavirus 2: NEGATIVE

## 2020-01-25 MED ORDER — ACETAMINOPHEN 500 MG PO TABS
1000.0000 mg | ORAL_TABLET | Freq: Once | ORAL | Status: AC
  Administered 2020-01-25: 1000 mg via ORAL
  Filled 2020-01-25: qty 2

## 2020-01-25 MED ORDER — DOXYCYCLINE HYCLATE 100 MG PO CAPS
100.0000 mg | ORAL_CAPSULE | Freq: Two times a day (BID) | ORAL | 0 refills | Status: DC
Start: 2020-01-25 — End: 2020-08-21

## 2020-01-25 MED ORDER — AEROCHAMBER Z-STAT PLUS/MEDIUM MISC: 1.0000 | Freq: Once | Status: DC

## 2020-01-25 MED ORDER — IPRATROPIUM-ALBUTEROL 0.5-2.5 (3) MG/3ML IN SOLN
3.0000 mL | Freq: Once | RESPIRATORY_TRACT | Status: AC
  Administered 2020-01-25: 3 mL via RESPIRATORY_TRACT

## 2020-01-25 MED ORDER — IPRATROPIUM-ALBUTEROL 0.5-2.5 (3) MG/3ML IN SOLN
3.0000 mL | Freq: Once | RESPIRATORY_TRACT | Status: DC
  Filled 2020-01-25: qty 3

## 2020-01-25 MED ORDER — PREDNISONE 20 MG PO TABS
40.0000 mg | ORAL_TABLET | Freq: Every day | ORAL | 0 refills | Status: DC
Start: 2020-01-25 — End: 2020-08-21

## 2020-01-25 MED ORDER — METHYLPREDNISOLONE SODIUM SUCC 125 MG IJ SOLR
125.0000 mg | Freq: Once | INTRAMUSCULAR | Status: AC
  Administered 2020-01-25: 125 mg via INTRAMUSCULAR

## 2020-01-25 MED ORDER — ALBUTEROL SULFATE (5 MG/ML) 0.5% IN NEBU
2.5000 mg | INHALATION_SOLUTION | Freq: Four times a day (QID) | RESPIRATORY_TRACT | 12 refills | Status: DC | PRN
Start: 2020-01-25 — End: 2021-10-04

## 2020-01-25 MED ORDER — DOXYCYCLINE HYCLATE 100 MG PO TABS
100.0000 mg | ORAL_TABLET | Freq: Once | ORAL | Status: AC
  Administered 2020-01-25: 100 mg via ORAL
  Filled 2020-01-25: qty 1

## 2020-01-25 MED ORDER — ALBUTEROL SULFATE HFA 108 (90 BASE) MCG/ACT IN AERS
8.0000 | INHALATION_SPRAY | Freq: Once | RESPIRATORY_TRACT | Status: DC
  Filled 2020-01-25: qty 6.7

## 2020-01-25 NOTE — ED Triage Notes (Signed)
Pt brought from urgent care due to asthma and wheezing. Pt diaphoretic. Pt drove from [place] to [facility]. Sats 93% on room air and 97% on 02 at 2 L. Pt given solumedrol 125 mg IM at urgent care and albuterol 2.5 en route with EMS. SOB since Tuesday worsening last night

## 2020-01-25 NOTE — ED Notes (Signed)
Patient ambulated around nurse's station. O2 sat 93%, Resp rate 24, HR 120s ambulating (88 at rest0. Patient does report feeling better. EDP made aware.

## 2020-01-25 NOTE — Discharge Instructions (Signed)
Please take doxycycline 100 mg twice a day Please take prednisone 40 mg daily for 5 days Please take albuterol every 4 hours as needed, you can use this as an inhaler or as a nebulizer allusion Seek a medical exam immediately for severe or worsening symptoms but please rest for the next 48 hours Emergency department for worsening symptoms Family doctor in 3 days for recheck

## 2020-01-25 NOTE — ED Notes (Signed)
Taxi Service called to transport patient back to vehicle at Urgent Care.

## 2020-01-25 NOTE — ED Provider Notes (Signed)
[facility] EMERGENCY DEPARTMENT Provider Note   CSN: [id number] Arrival date & time: 01/25/20  1226     History Chief Complaint  Patient presents with  . Shortness of Breath    Alice Bowers is a 69 y.o. female.  HPI   This patient is a 69 year old female, she has a history of asthma, history of breast cancer status post bilateral mastectomy, currently on anastrozole.  She presents to the hospital today with a complaint of shortness of breath.  States that her coughing started a couple of weeks ago, got much worse around Tuesday.  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  She has had both of her vaccinations.  She is continued to have coughing productive of green phlegm, she drove here from [place] to get treated at the urgent care, they found her to be diffusely wheezing and oxygen of 93% with tachypnea placed her on 2 L of oxygen and transported her to the hospital by ambulance as they were unable to perform nebulized treatments.  She was given Solu-Medrol prehospital.  She states that she has been taking her albuterol most recently last night but states it has not helped very much.  She denies fevers or chills or nausea or vomiting.  She has chronic swelling of her legs which is not new and is not known to have congestive heart failure.  Past Medical History:  Diagnosis Date  . Anxiety   . Arthritis   . Cancer [facility])    R breast cancer  . Depression   . Heart murmur   . High blood pressure   . High cholesterol   . Kidney stones   . Macular degeneration of left eye   . Macular degeneration of right eye   . Obstructive sleep apnea   . Restless leg syndrome     Patient Active Problem List   Diagnosis Date Noted  . Malignant neoplasm of upper-outer quadrant of right female breast ([place]) 11/15/2018  . Impaired fasting glucose 09/02/2015  . Obesity, Class III, BMI 40-49.9 (morbid obesity) ([place]) 05/14/2015  . Tobacco dependence  02/11/2015  . Heterozygous MTHFR mutation [id number] ([place]) 10/13/2014  . Major depressive disorder, recurrent episode, moderate ([place]) 10/03/2014  . Recurrent major depressive disorder, in partial remission ([place]) 10/03/2014  . OSA on CPAP 02/13/2014  . Mixed hyperlipidemia 02/13/2014  . Osteoarthritis 02/13/2014  . Restless legs syndrome (RLS) 02/13/2014  . 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 History of kidney stones 02/13/2014  . Benign essential hypertension 02/13/2014  . Asthma 02/13/2014    Past Surgical History:  Procedure Laterality Date  . ABDOMINAL HYSTERECTOMY    . APPENDECTOMY    . arthroscopy left shoulder    . CHOLECYSTECTOMY    . joint fusion bilateral thumbs    . MASTECTOMY Bilateral 2019  . ORIF right ankle    . REPLACEMENT TOTAL KNEE BILATERAL       OB History   No obstetric history on file.     Family History  Problem Relation Age of Onset  . Depression Mother   . Hyperlipidemia Mother   . Hypertension Mother   . Depression Brother   . Hyperlipidemia Father   . Hypertension Father   . Asthma Father     Social History   Tobacco Use  . Smoking status: Current Some Day Smoker    Packs/day: 1.00    Years: 20.00    Pack years: 20.00  Types: Cigarettes  . Smokeless tobacco: Never Used  Vaping Use  . Vaping Use: Former  Substance Use Topics  . Alcohol use: Not Currently  . Drug use: No    Home Medications Prior to Admission medications   Medication Sig Start Date End Date Taking? Authorizing Provider  albuterol (ACCUNEB) 1.25 MG/3ML nebulizer solution Take 1 ampule by nebulization every 6 (six) hours as needed for wheezing.   Yes [provider]  Albuterol Sulfate 108 (90 Base) MCG/ACT AEPB Inhale 1-2 puffs into the lungs 4 (four) times daily as needed.    Yes [provider]  amLODipine (NORVASC) 2.5 MG tablet Take 2.5 mg by mouth daily.  05/01/19  Yes [provider]  anastrozole (ARIMIDEX) 1 MG tablet Take 1 mg by mouth  daily.   Yes [provider]  Armodafinil 250 MG tablet Take 1 tablet by mouth daily. 06/12/19  Yes [provider]  atorvastatin (LIPITOR) 40 MG tablet Take 40 mg by mouth daily.   Yes Tempie Hoist, MD  buPROPion [facility] SR) 150 MG 12 hr tablet TAKE 2 TABLETS BY MOUTH EVERY MORNING AND 1 TABLET AT BEDTIME Patient taking differently: Take 150-300 mg by mouth 2 (two) times daily. TAKE 2 TABLETS BY MOUTH EVERY MORNING AND 1 TABLET AT BEDTIME 10/28/19  Yes [name], Arlyce Harman, MD  calcium-vitamin D 250-100 MG-UNIT tablet Take 1 tablet by mouth daily. 03/11/19  Yes [provider]  cetirizine (ZYRTEC) 10 MG tablet Take 10 mg by mouth daily as needed for allergies.   Yes [provider]  diclofenac Sodium (VOLTAREN) 1 % GEL Apply 1 g topically 4 (four) times daily as needed. 11/28/19  Yes [provider]  escitalopram (LEXAPRO) 20 MG tablet Take 1 tablet (20 mg total) by mouth daily. 10/28/19  Yes [name], Arlyce Harman, MD  fluticasone (FLONASE) 50 MCG/ACT nasal spray Place 1 spray into both nostrils daily as needed. 12/13/19  Yes [provider]  fluticasone furoate-vilanterol (BREO ELLIPTA) 200-25 MCG/INH AEPB Inhale 1 puff into the lungs daily.   Yes [provider]  folic acid (FOLVITE) 1 MG tablet Take 1 mg by mouth daily.   Yes Mcarthur Rossetti, MD  furosemide (LASIX) 20 MG tablet Take 20 mg by mouth daily as needed for fluid.  11/05/18  Yes [provider]  gabapentin (NEURONTIN) 300 MG capsule Take 300 mg by mouth 3 (three) times daily.    Yes [provider]  hydrochlorothiazide (HYDRODIURIL) 25 MG tablet Take 25 mg by mouth daily.   Yes [provider]  lamoTRIgine (LAMICTAL) 150 MG tablet Take 1 tablet (150 mg total) by mouth daily. 10/28/19  Yes [name], Arlyce Harman, MD  LORazepam (ATIVAN) 0.5 MG tablet Take 1 tablet (0.5 mg total) by mouth daily as needed for anxiety (panic attack). 10/28/19 10/27/20 Yes [name], Arlyce Harman, MD    losartan (COZAAR) 100 MG tablet Take 100 mg by mouth daily.   Yes [provider]  meloxicam (MOBIC) 15 MG tablet Take 15 mg by mouth daily as needed.  12/30/19  Yes [provider]  Multiple Vitamin (MULTIVITAMIN WITH MINERALS) TABS tablet Take 1 tablet by mouth daily.   Yes [provider]  Omega-3 Fatty Acids (FISH OIL) 1000 MG CAPS Take 1 capsule by mouth daily.   Yes [provider]  pramipexole (MIRAPEX) 0.5 MG tablet Take 0.5 mg by mouth 2 (two) times daily.   Yes Gayla Medicus, MD  tiZANidine (ZANAFLEX) 4 MG tablet Take  2 mg by mouth 2 (two) times daily as needed for muscle spasms.  12/31/18  Yes [provider]  albuterol (PROVENTIL) (5 MG/ML) 0.5% nebulizer solution Take 0.5 mLs (2.5 mg total) by nebulization every 6 (six) hours as needed for wheezing or shortness of breath. 01/25/20   Noemi Chapel, MD  doxycycline (VIBRAMYCIN) 100 MG capsule Take 1 capsule (100 mg total) by mouth 2 (two) times daily. 01/25/20   Noemi Chapel, MD  predniSONE (DELTASONE) 20 MG tablet Take 2 tablets (40 mg total) by mouth daily. 01/25/20   Noemi Chapel, MD  allopurinol (ZYLOPRIM) 300 MG tablet Take 300 mg by mouth daily.  10/06/19  Tempie Hoist, MD    Allergies    Azo [phenazopyridine], Pyridium [phenazopyridine hcl], Requip [ropinirole hcl], Betadine [povidone iodine], Cephalosporins, Demerol [meperidine], Edta [edetic acid], Macrobid [nitrofurantoin macrocrystal], Sulfa antibiotics, Ceclor [cefaclor], Dynacirc [isradipine], Keflex [cephalexin], and Voltaren [diclofenac sodium]  Review of Systems   Review of Systems  All other systems reviewed and are negative.   Physical Exam Updated Vital Signs BP 129/80 (BP Location: Left Arm)   Pulse 84   Temp 98.8 F (37.1 C) (Oral)   Resp 20   Ht 1.6 m (5\' 3" )   Wt 106.6 kg   SpO2 94%   BMI 41.63 kg/m   Physical Exam Vitals and nursing note reviewed.  Constitutional:      General: She is not in acute  distress.    Appearance: She is well-developed.  HENT:     Head: Normocephalic and atraumatic.     Mouth/Throat:     Pharynx: No oropharyngeal exudate.  Eyes:     General: No scleral icterus.       Right eye: No discharge.        Left eye: No discharge.     Conjunctiva/sclera: Conjunctivae normal.     Pupils: Pupils are equal, round, and reactive to light.  Neck:     Thyroid: No thyromegaly.     Vascular: No JVD.  Cardiovascular:     Rate and Rhythm: Normal rate and regular rhythm.     Heart sounds: Normal heart sounds. No murmur heard.  No friction rub. No gallop.   Pulmonary:     Effort: Respiratory distress present.     Breath sounds: Wheezing present. No rales.  Abdominal:     General: Bowel sounds are normal. There is no distension.     Palpations: Abdomen is soft. There is no mass.     Tenderness: There is no abdominal tenderness.  Musculoskeletal:        General: No tenderness. Normal range of motion.     Cervical back: Normal range of motion and neck supple.     Right lower leg: Edema present.     Left lower leg: Edema present.  Lymphadenopathy:     Cervical: No cervical adenopathy.  Skin:    General: Skin is warm and dry.     Findings: No erythema or rash.  Neurological:     Mental Status: She is alert.     Coordination: Coordination normal.  Psychiatric:        Behavior: Behavior normal.     ED Results / Procedures / Treatments   Labs (all labs ordered are listed, but only abnormal results are displayed) Labs Reviewed  CBC WITH DIFFERENTIAL/PLATELET - Abnormal; Notable for the following components:      Result Value   WBC 12.0 (*)    Neutro Abs 8.5 (*)  Monocytes Absolute 1.4 (*)    Abs Immature Granulocytes 0.10 (*)    All other components within normal limits  COMPREHENSIVE METABOLIC PANEL - Abnormal; Notable for the following components:   Potassium 3.4 (*)    Glucose, Bld 114 (*)    Creatinine, Ser 1.50 (*)    GFR calc non Af Amer 35 (*)     GFR calc Af Amer 41 (*)    All other components within normal limits  SARS CORONAVIRUS 2 BY RT PCR (HOSPITAL ORDER, [place] [facility])  BRAIN NATRIURETIC PEPTIDE  TROPONIN I (HIGH SENSITIVITY)    EKG EKG Interpretation  Date/Time:  Saturday January 25 2020 12:35:42 EDT Ventricular Rate:  86 PR Interval:    QRS Duration: 104 QT Interval:  368 QTC Calculation: 441 R Axis:   78 Text Interpretation: Sinus rhythm Low voltage, precordial leads Normal ECG Confirmed by Noemi Chapel [phone]) on 01/25/2020 2:30:57 PM   Radiology DG Chest 2 View  Result Date: 01/25/2020 CLINICAL DATA:  Shortness of breath with wheezing EXAM: CHEST - 2 VIEW COMPARISON:  December 08, 2016 FINDINGS: Port-A-Cath tip is in the superior vena cava. No pneumothorax. There is slight atelectasis in the right mid lung. Lungs elsewhere are clear. Heart size and pulmonary vascularity are normal. No adenopathy. There is mild degenerative change in the thoracic spine. IMPRESSION: Port-A-Cath tip in superior vena cava. No edema or airspace opacity. Mild right midlung atelectasis. Cardiac silhouette normal. Electronically Signed   By: Lowella Grip III M.D.   On: 01/25/2020 13:42    Procedures Procedures (including critical care time)  Medications Ordered in ED Medications  ipratropium-albuterol (DUONEB) 0.5-2.5 (3) MG/3ML nebulizer solution 3 mL (3 mLs Nebulization Not Given 01/25/20 1446)  ipratropium-albuterol (DUONEB) 0.5-2.5 (3) MG/3ML nebulizer solution 3 mL (3 mLs Nebulization Given 01/25/20 1446)  doxycycline (VIBRA-TABS) tablet 100 mg (100 mg Oral Given 01/25/20 1500)  acetaminophen (TYLENOL) tablet 1,000 mg (1,000 mg Oral Given 01/25/20 1505)    ED Course  I have reviewed the triage vital signs and the nursing notes.  Pertinent labs & imaging results that were available during my care of the patient were reviewed by me and considered in my medical decision making (see chart for details).    MDM  Rules/Calculators/A&P                            This patient presents to the ED for concern of shortness of breath, this involves an extensive number of treatment options, and is a complaint that carries with it a high risk of complications and morbidity.  The differential diagnosis includes asthma exacerbation, bronchitis, pneumonia, less likely to be pulmonary embolism, congestive heart failure, pneumothorax   Lab Tests:   I Ordered, reviewed, and interpreted labs, which included CBC, metabolic panel, BNP, lactic acid  Medicines ordered:   I ordered medication albuterol metered-dose inhaler and nebulized treatments for the treatment of her severe wheezing  Imaging Studies ordered:   I ordered imaging studies which included chest x-ray and  I independently visualized and interpreted imaging which showed no infiltrates  Additional history obtained:   Additional history obtained from medical record and paramedics  Previous records obtained and reviewed it is evident that the patient follows up with pulmonology, she also sees oncology for her ongoing treatment of her primary malignancy of the breast  Consultations Obtained:   I consulted with the patient and discussed lab and imaging  findings  Reevaluation:  After the interventions stated above, I reevaluated the patient and found improved significantly after nebulized treatments, Covid negative, labs reassuring, mild leukocytosis but negative x-ray.  Doxycycline started, steroids given, nebulizers given, patient given prescriptions for same at discharge, she ambulated with an oxygen of no lower than 93% on room air, she did get tachycardic but improved significantly with rest back to a heart rate of 80 and her oxygen of 96%, she is unlabored is speaking in full sentences and comfortable with the plan for discharge  Critical Interventions:  . Nebulizer treatments times many, steroids  . evaluation for Covid . Treatment for  infectious causes   Final Clinical Impression(s) / ED Diagnoses Final diagnoses:  Moderate persistent asthma with exacerbation    Rx / DC Orders ED Discharge Orders         Ordered    doxycycline (VIBRAMYCIN) 100 MG capsule  2 times daily     Discontinue  Reprint     01/25/20 1522    albuterol (PROVENTIL) (5 MG/ML) 0.5% nebulizer solution  Every 6 hours PRN     Discontinue  Reprint     01/25/20 1522    predniSONE (DELTASONE) 20 MG tablet  Daily     Discontinue  Reprint     01/25/20 1522           Noemi Chapel, MD 01/25/20 1529

## 2020-01-25 NOTE — ED Triage Notes (Signed)
Pt was at work in [employer] and developed asthma flare. Pt is diaphoretic and has audible wheezing, speaking in broken sentences

## 2020-01-25 NOTE — ED Notes (Signed)
Patient is being discharged from the Urgent Care and sent to the Emergency Department viaEMS . Per B. Wurst , patient is in need of higher level of care due to respiratory distress . Patient is aware and verbalizes understanding of plan of care. BP 198/75  O2 94% ON 2l [place]

## 2020-01-28 ENCOUNTER — Encounter (HOSPITAL_COMMUNITY): Payer: Self-pay | Admitting: Psychiatry

## 2020-01-28 ENCOUNTER — Other Ambulatory Visit: Payer: Self-pay

## 2020-01-28 ENCOUNTER — Telehealth (INDEPENDENT_AMBULATORY_CARE_PROVIDER_SITE_OTHER): Payer: Medicare PPO | Admitting: Psychiatry

## 2020-01-28 DIAGNOSIS — F331 Major depressive disorder, recurrent, moderate: Secondary | ICD-10-CM

## 2020-01-28 DIAGNOSIS — F410 Panic disorder [episodic paroxysmal anxiety] without agoraphobia: Secondary | ICD-10-CM | POA: Diagnosis not present

## 2020-01-28 DIAGNOSIS — F411 Generalized anxiety disorder: Secondary | ICD-10-CM

## 2020-01-28 MED ORDER — LAMOTRIGINE 150 MG PO TABS: 150.0000 mg | ORAL_TABLET | Freq: Every day | ORAL | 2 refills | Status: DC

## 2020-01-28 MED ORDER — BUPROPION HCL ER (SR) 150 MG PO TB12: ORAL_TABLET | ORAL | 2 refills | Status: DC

## 2020-01-28 MED ORDER — ESCITALOPRAM OXALATE 20 MG PO TABS: 20.0000 mg | ORAL_TABLET | Freq: Every day | ORAL | 2 refills | Status: DC

## 2020-01-28 NOTE — Progress Notes (Signed)
Virtual Visit via Telephone Note  I connected with Alice Bowers on 01/28/20 at  9:40 AM EDT by telephone and verified that I am speaking with the correct person using two identifiers.   I discussed the limitations, risks, security and privacy concerns of performing an evaluation and management service by telephone and the availability of in person appointments. I also discussed with the patient that there may be a patient responsible charge related to this service. The patient expressed understanding and agreed to proceed.  Patient location; home Provider location; home office  History of Present Illness: Patient is evaluated by phone session.  Recently she had a flareup of asthma and she is taking medication for that.  Overall she described her mood is stable.  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  She did talk to HR and things got a little bit better.  She is sleeping better.  She is in therapy with [name] which is going well.  She denies any major panic attack in recent months and she still have leftover lorazepam.  She has no rash, itching, tremors or shakes.  She denies any suicidal thoughts.  She lives by herself.  She admitted some family issues because she was scheduled to have vacation with her sister but it and with arguments and patient is not going with her on vacation.  Patient does not want to change her medication since she feels it is helping her anxiety and depression.  She recently had blood work which shows creatinine 1.5.  Patient is aware about it and admitted that she is not drinking enough water.  Her appetite is okay.  Her energy level is okay.   Past Psychiatric History:Reviewed. H/Odepression since 1995 when going through divorce. H/O 4 inpatient treatment. H/O suicidal attempt by taking overdose on Seroquel. No h/o paranoia, psychosis or aggressive  behavior. Tried Paxil, Zoloft, Remeron(weight gain)and Seroquel with limited response.   Recent Results (from the past 2160 hour(s))  CBC with Differential/Platelet     Status: Abnormal   Collection Time: 01/25/20 12:40 PM  Result Value Ref Range   WBC 12.0 (H) 4.0 - 10.5 K/uL   RBC 4.13 3.87 - 5.11 MIL/uL   Hemoglobin 12.5 12.0 - 15.0 g/dL   HCT 37.9 36 - 46 %   MCV 91.8 80.0 - 100.0 fL   MCH 30.3 26.0 - 34.0 pg   MCHC 33.0 30.0 - 36.0 g/dL   RDW 14.4 11.5 - 15.5 %   Platelets 264 150 - 400 K/uL   nRBC 0.0 0.0 - 0.2 %   Neutrophils Relative % 71 %   Neutro Abs 8.5 (H) 1.7 - 7.7 K/uL   Lymphocytes Relative 13 %   Lymphs Abs 1.6 0.7 - 4.0 K/uL   Monocytes Relative 11 %   Monocytes Absolute 1.4 (H) 0 - 1 K/uL   Eosinophils Relative 3 %   Eosinophils Absolute 0.3 0 - 0 K/uL   Basophils Relative 1 %   Basophils Absolute 0.1 0 - 0 K/uL   Immature Granulocytes 1 %   Abs Immature Granulocytes 0.10 (H) 0.00 - 0.07 K/uL    Comment: Performed at [facility], [address] [place], [place] [address]  Comprehensive metabolic panel     Status: Abnormal   Collection Time: 01/25/20 12:40 PM  Result Value Ref Range   Sodium 139 135 - 145 mmol/L  Potassium 3.4 (L) 3.5 - 5.1 mmol/L   Chloride 104 98 - 111 mmol/L   CO2 24 22 - 32 mmol/L   Glucose, Bld 114 (H) 70 - 99 mg/dL    Comment: Glucose reference range applies only to samples taken after fasting for at least 8 hours.   BUN 21 8 - 23 mg/dL   Creatinine, Ser 1.50 (H) 0.44 - 1.00 mg/dL   Calcium 9.7 8.9 - 10.3 mg/dL   Total Protein 6.8 6.5 - 8.1 g/dL   Albumin 4.4 3.5 - 5.0 g/dL   AST 20 15 - 41 U/L   ALT 16 0 - 44 U/L   Alkaline Phosphatase 126 38 - 126 U/L   Total Bilirubin 0.4 0.3 - 1.2 mg/dL   GFR calc non Af Amer 35 (L) >60 mL/min   GFR calc Af Amer 41 (L) >60 mL/min   Anion gap 11 5 - 15    Comment: Performed at [facility], [address], [place], [place] [address]  Troponin I (High Sensitivity)     Status: None    Collection Time: 01/25/20 12:40 PM  Result Value Ref Range   Troponin I (High Sensitivity) 5 <18 ng/L    Comment: (NOTE) Elevated high sensitivity troponin I (hsTnI) values and significant  changes across serial measurements may suggest ACS but many other  chronic and acute conditions are known to elevate hsTnI results.  Refer to the "Links" section for chest pain algorithms and additional  guidance. Performed at [facility], [address] [place], [place] [address]   Brain natriuretic peptide     Status: None   Collection Time: 01/25/20 12:40 PM  Result Value Ref Range   B Natriuretic Peptide 68.0 0.0 - 100.0 pg/mL    Comment: Performed at [facility], [address] [place], [place] [address]  SARS Coronavirus 2 by RT PCR (hospital order, performed in [facility] hospital lab) Nasopharyngeal Nasopharyngeal Swab     Status: None   Collection Time: 01/25/20 12:53 PM   Specimen: Nasopharyngeal Swab  Result Value Ref Range   SARS Coronavirus 2 NEGATIVE NEGATIVE    Comment: (NOTE) SARS-CoV-2 target nucleic acids are NOT DETECTED.  The SARS-CoV-2 RNA is generally detectable in upper and lower respiratory specimens during the acute phase of infection. The lowest concentration of SARS-CoV-2 viral copies this assay can detect is 250 copies / mL. A negative result does not preclude SARS-CoV-2 infection and should not be used as the sole basis for treatment or other patient management decisions.  A negative result may occur with improper specimen collection / handling, submission of specimen other than nasopharyngeal swab, presence of viral mutation(s) within the areas targeted by this assay, and inadequate number of viral copies (<250 copies / mL). A negative result must be combined with clinical observations, patient history, and epidemiological information.  Fact Sheet for Patients:   StrictlyIdeas.no  Fact Sheet for Healthcare  Providers: BankingDealers.co.za  This test is not yet approved or  cleared by the [place] FDA and has been authorized for detection and/or diagnosis of SARS-CoV-2 by FDA under an Emergency Use Authorization (EUA).  This EUA will remain in effect (meaning this test can be used) for the duration of the COVID-19 declaration under Section 564(b)(1) of the Act, 21 U.S.C. section 360bbb-3(b)(1), unless the authorization is terminated or revoked sooner.  Performed at [facility], [place], [address], [place],  [address]       Psychiatric Specialty Exam: Physical Exam  Review  of Systems  Weight 235 lb (106.6 kg).There is no height or weight on file to calculate BMI.  General Appearance: NA  Eye Contact:  NA  Speech:  Normal Rate  Volume:  Decreased  Mood:  Dysphoric  Affect:  NA  Thought Process:  Goal Directed  Orientation:  Full (Time, Place, and Person)  Thought Content:  WDL  Suicidal Thoughts:  No  Homicidal Thoughts:  No  Memory:  Immediate;   Good Recent;   Good Remote;   Good  Judgement:  Intact  Insight:  Present  Psychomotor Activity:  NA  Concentration:  Concentration: Good and Attention Span: Good  Recall:  Good  Fund of Knowledge:  Good  Language:  Good  Akathisia:  No  Handed:  Right  AIMS (if indicated):     Assets:  Communication Skills Desire for Improvement Housing Talents/Skills Transportation  ADL's:  Intact  Cognition:  WNL  Sleep:   ok      Assessment and Plan: Major depressive disorder, recurrent.  Generalized anxiety disorder.  Panic attack.  I reviewed blood work results and discuss her creatinine.  She is aware and she promised that she will drink a lot of water.  Overall she describes her mood is stable and does not want to change medication.  Encouraged to continue therapy with [name].  Continue Lexapro 20 mg daily, Lamictal 150 mg daily and Wellbutrin 300 mg in the morning 150 at bedtime.  Patient does not  need a new prescription lorazepam since she already have refills remaining.  Recommended to call us back if she is any question or any concern.  Follow-up in 3 months.  Follow Up Instructions:    I discussed the assessment and treatment plan with the patient. The patient was provided an opportunity to ask questions and all were answered. The patient agreed with the plan and demonstrated an understanding of the instructions.   The patient was advised to call back or seek an in-person evaluation if the symptoms worsen or if the condition fails to improve as anticipated.  I provided 20 minutes of non-face-to-face time during this encounter.   Kathlee Nations, MD

## 2020-01-30 ENCOUNTER — Other Ambulatory Visit: Payer: Self-pay

## 2020-01-30 ENCOUNTER — Encounter (HOSPITAL_COMMUNITY): Payer: Self-pay | Admitting: Licensed Clinical Social Worker

## 2020-01-30 ENCOUNTER — Ambulatory Visit (INDEPENDENT_AMBULATORY_CARE_PROVIDER_SITE_OTHER): Payer: Medicare PPO | Admitting: Licensed Clinical Social Worker

## 2020-01-30 DIAGNOSIS — F331 Major depressive disorder, recurrent, moderate: Secondary | ICD-10-CM | POA: Diagnosis not present

## 2020-01-30 NOTE — Progress Notes (Signed)
Virtual Visit via Video Note  I connected with Alice Bowers on 01/30/20 at  9:00 AM EDT by a video enabled telemedicine application and verified that I am speaking with the correct person using two identifiers.  Location: Patient: home Provider: office   I discussed the limitations of evaluation and management by telemedicine and the availability of in person appointments. The patient expressed understanding and agreed to proceed.  Type of Therapy: Individual Therapy   Treatment Goals addressed: "I need somebody to talk to about my problems so I won't get snappy with the other people in my life"   Interventions: MI   Summary: Alice Bowers is a 69 y.o. female who presents with MDD. mild   Suicidal/Homicidal: Nowithout intent/plan   Therapist Response: [name] met with clinician for an individual session. Aristea discussed her psychiatric symptoms, her current life events and her goals for therapy. Charnita reports that she has been doing alright. She identified no further contact from either of her sisters, which hurts her feelings. Clinician processed these relationship using MI OARS. Clinician reflected challenges in the past and noted possibility that things will be repaired after some time. Clinician discussed relationship with neighbor, who is her best friend/primary support. Clinician identified the importance and the value of having that friend who can be the emergency contact, on whom you can really depend. Clinician provided time and space for [name] to process disappointment due to some disengagement from other family members. Clinician encouraged [name] to focus more on people who love her and are present and available for her, and to let go of people who are adding additional hardship.     Plan: Return again in 2-3 weeks.   Diagnosis: MDD, mild   I discussed the assessment and treatment plan with the patient. The patient was provided an opportunity to ask questions and all  were answered. The patient agreed with the plan and demonstrated an understanding of the instructions.   The patient was advised to call back or seek an in-person evaluation if the symptoms worsen or if the condition fails to improve as anticipated.  I provided 45 minutes of non-face-to-face time during this encounter.   [name], LCSW

## 2020-02-13 ENCOUNTER — Other Ambulatory Visit: Payer: Self-pay

## 2020-02-13 ENCOUNTER — Ambulatory Visit (INDEPENDENT_AMBULATORY_CARE_PROVIDER_SITE_OTHER): Payer: Medicare PPO | Admitting: Licensed Clinical Social Worker

## 2020-02-13 ENCOUNTER — Encounter (HOSPITAL_COMMUNITY): Payer: Self-pay | Admitting: Licensed Clinical Social Worker

## 2020-02-13 DIAGNOSIS — F331 Major depressive disorder, recurrent, moderate: Secondary | ICD-10-CM

## 2020-02-13 NOTE — Progress Notes (Signed)
Virtual Visit via Video Note  I connected with Shantella Blubaugh on 02/13/20 at  9:00 AM EDT by a video enabled telemedicine application and verified that I am speaking with the correct person using two identifiers.  Location: Patient: office Provider: home   I discussed the limitations of evaluation and management by telemedicine and the availability of in person appointments. The patient expressed understanding and agreed to proceed.  Type of Therapy: Individual Therapy   Treatment Goals addressed: "I need somebody to talk to about my problems so I won't get snappy with the other people in my life"   Interventions: MI   Summary: Adella Manolis is a 69 y.o. female who presents with MDD. moderate   Suicidal/Homicidal: Nowithout intent/plan   Therapist Response: [name] met with clinician for an individual session. Miski discussed her psychiatric symptoms, her current life events and her goals for therapy. Annamary shared that she feels very tired from working so much and feeling so unhappy with her job. Clinician utilized MI OARS to reflect and summarize ongoing struggles in her current job, which has impacted her mental well being. Clinician processed options and identified the importance of applying for jobs while working, in order to maintain a steady paycheck. Clinician explored self care and provided options for cheap and easy self-care strategies, such as wearing perfume, taking a little extra time to do hair and makeup, self-massage, or wearing perfume. Clinician processed ways to incorporate these small things into the daily routine. Clinician also discussed getting a little exercise into her daily routine, outside of walking up and down halls at work.  Clinician explored social interactions. Sharifa identified little time for socialization and lack of supports, as her sisters are still not in contact. Clinician processed thoughts and feelings about that incident and explored Ruqayyah's  options about reaching out or waiting.     Plan: Return again in 2-3 weeks.   Diagnosis: MDD, moderate    I discussed the assessment and treatment plan with the patient. The patient was provided an opportunity to ask questions and all were answered. The patient agreed with the plan and demonstrated an understanding of the instructions.   The patient was advised to call back or seek an in-person evaluation if the symptoms worsen or if the condition fails to improve as anticipated.  I provided 45 minutes of non-face-to-face time during this encounter.   [name], LCSW

## 2020-02-27 ENCOUNTER — Ambulatory Visit (INDEPENDENT_AMBULATORY_CARE_PROVIDER_SITE_OTHER): Payer: Medicare PPO | Admitting: Licensed Clinical Social Worker

## 2020-02-27 ENCOUNTER — Encounter (HOSPITAL_COMMUNITY): Payer: Self-pay | Admitting: Licensed Clinical Social Worker

## 2020-02-27 ENCOUNTER — Other Ambulatory Visit: Payer: Self-pay

## 2020-02-27 DIAGNOSIS — F331 Major depressive disorder, recurrent, moderate: Secondary | ICD-10-CM | POA: Diagnosis not present

## 2020-02-27 NOTE — Progress Notes (Signed)
Virtual Visit via Video Note  I connected with Alice Bowers on 02/27/20 at  9:00 AM EDT by a video enabled telemedicine application and verified that I am speaking with the correct person using two identifiers.  Location: Patient: home Provider: office   I discussed the limitations of evaluation and management by telemedicine and the availability of in person appointments. The patient expressed understanding and agreed to proceed.   Type of Therapy: Individual Therapy   Treatment Goals addressed: "I need somebody to talk to about my problems so I won't get snappy with the other people in my life"   Interventions: MI   Summary: Alice Bowers is a 69 y.o. female who presents with MDD. moderate   Suicidal/Homicidal: Nowithout intent/plan   Therapist Response: [name] met with clinician for an individual session. Alice Bowers discussed her psychiatric symptoms, her current life events and her goals for therapy. Alice Bowers shared her current challenges and stresses at work. Clinician utilized MI OARS to reflect and summarize thoughts and feelings about her new corporate system and the frustration and sadness she feels for their decisions. Clinician reflected disappointment and feelings of helplessness related to how she does her job, as they are not providing the materials she needs. 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 Clinician discussed coping skills and ways to make time outside of work more enjoyable. Alice Bowers reported that she is taking herself on a weekend trip to the beach to get away and do what she wants to do. Clinician reflected the excitement and pleasure in preparing for this trip, being able to do what she wants to do, and not to have to report to anyone while there.    Plan: Return again in 2-3 weeks.   Diagnosis: MDD, moderate  I discussed the assessment and treatment plan with the patient. The patient  was provided an opportunity to ask questions and all were answered. The patient agreed with the plan and demonstrated an understanding of the instructions.   The patient was advised to call back or seek an in-person evaluation if the symptoms worsen or if the condition fails to improve as anticipated.  I provided 45 minutes of non-face-to-face time during this encounter.   [name], LCSW

## 2020-03-12 ENCOUNTER — Ambulatory Visit (INDEPENDENT_AMBULATORY_CARE_PROVIDER_SITE_OTHER): Payer: Medicare PPO | Admitting: Licensed Clinical Social Worker

## 2020-03-12 ENCOUNTER — Encounter (HOSPITAL_COMMUNITY): Payer: Self-pay | Admitting: Licensed Clinical Social Worker

## 2020-03-12 ENCOUNTER — Other Ambulatory Visit: Payer: Self-pay

## 2020-03-12 DIAGNOSIS — F330 Major depressive disorder, recurrent, mild: Secondary | ICD-10-CM | POA: Diagnosis not present

## 2020-03-12 NOTE — Progress Notes (Signed)
Virtual Visit via Video Note  I connected with Alice Bowers on 03/12/20 at  9:00 AM EDT by a video enabled telemedicine application and verified that I am speaking with the correct person using two identifiers.  Location: Patient: dr's office Provider: office   I discussed the limitations of evaluation and management by telemedicine and the availability of in person appointments. The patient expressed understanding and agreed to proceed.   Type of Therapy: Individual Therapy   Treatment Goals addressed: "I need somebody to talk to about my problems so I won't get snappy with the other people in my life"   Interventions: MI   Summary: Alice Bowers is a 69 y.o. female who presents with MDD. mild   Suicidal/Homicidal: Nowithout intent/plan   Therapist Response: Alice Bowers met with clinician for an individual session. Alice Bowers discussed her psychiatric symptoms, her current life events and her goals for therapy. Alice Bowers shared that she has a job interview tomorrow with an urgent care and she is feeling optimistic. Clinician processed thoughts and feelings about the chance to get out of current job and the excitement about a new opportunity. Clinician explored family interactions and noted ongoing estrangement from sisters and little contact with granddaughter. However, she reports her niece has been helping her clean out her house and sort through "years of junk". Clinician reflected the importance and value of getting rid of old junk to make room for the new.  Alice Bowers ended session early due to being in another doctor's appointment.   Plan: Return again in 2-3 weeks.   Diagnosis: MDD, mild  I discussed the assessment and treatment plan with the patient. The patient was provided an opportunity to ask questions and all were answered. The patient agreed with the plan and demonstrated an understanding of the instructions.   The patient was advised to call back or seek an in-person evaluation  if the symptoms worsen or if the condition fails to improve as anticipated.  I provided 18 minutes of non-face-to-face time during this encounter.   [name], LCSW

## 2020-03-31 ENCOUNTER — Ambulatory Visit (INDEPENDENT_AMBULATORY_CARE_PROVIDER_SITE_OTHER): Payer: Medicare PPO | Admitting: Licensed Clinical Social Worker

## 2020-03-31 ENCOUNTER — Encounter (HOSPITAL_COMMUNITY): Payer: Self-pay | Admitting: Licensed Clinical Social Worker

## 2020-03-31 ENCOUNTER — Other Ambulatory Visit: Payer: Self-pay

## 2020-03-31 DIAGNOSIS — F330 Major depressive disorder, recurrent, mild: Secondary | ICD-10-CM | POA: Diagnosis not present

## 2020-03-31 NOTE — Progress Notes (Signed)
Virtual Visit via Video Note  I connected with Alice Bowers on 03/31/20 at  9:00 AM EDT by a video enabled telemedicine application and verified that I am speaking with the correct person using two identifiers.  Location: Patient: home Provider: office   I discussed the limitations of evaluation and management by telemedicine and the availability of in person appointments. The patient expressed understanding and agreed to proceed.   Type of Therapy: Individual Therapy   Treatment Goals addressed: "I need somebody to talk to about my problems so I won't get snappy with the other people in my life"   Interventions: MI   Summary: Alice Bowers is a 69 y.o. female who presents with MDD. mild   Suicidal/Homicidal: Nowithout intent/plan   Therapist Response: [name] met with clinician for an individual session. Alice Bowers discussed her psychiatric symptoms, her current life events and her goals for therapy. 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 Clinician utilized MI OARS to reflect and summarize thoughts and feelings about the new job, as well as how the opportunity presented itself to her. Clinician discussed feelings about turning in her notice and the reaction from her supervisor.  Alice Bowers processed some health concerns that have arisen over the past month. Clinician reflected the importance of being her own best health advocate when it comes to these concerns. Clinician explored options for getting second opinions and seeking out different referrals, as the endocrinologist she was referred to would not see her for 2 months.   Plan: Return again in 2-3 weeks.   Diagnosis: MDD, mild    I discussed the assessment and treatment plan with the patient. The patient was provided an opportunity to ask questions and all were answered. The patient agreed with the plan and demonstrated an understanding of  the instructions.   The patient was advised to call back or seek an in-person evaluation if the symptoms worsen or if the condition fails to improve as anticipated.  I provided 45 minutes of non-face-to-face time during this encounter.   [name], LCSW

## 2020-04-09 ENCOUNTER — Other Ambulatory Visit (HOSPITAL_COMMUNITY): Payer: Self-pay | Admitting: Psychiatry

## 2020-04-09 DIAGNOSIS — F331 Major depressive disorder, recurrent, moderate: Secondary | ICD-10-CM

## 2020-04-09 DIAGNOSIS — F411 Generalized anxiety disorder: Secondary | ICD-10-CM

## 2020-04-16 ENCOUNTER — Ambulatory Visit (INDEPENDENT_AMBULATORY_CARE_PROVIDER_SITE_OTHER): Payer: Medicare PPO | Admitting: Licensed Clinical Social Worker

## 2020-04-16 ENCOUNTER — Encounter (HOSPITAL_COMMUNITY): Payer: Self-pay | Admitting: Licensed Clinical Social Worker

## 2020-04-16 ENCOUNTER — Other Ambulatory Visit: Payer: Self-pay

## 2020-04-16 DIAGNOSIS — F330 Major depressive disorder, recurrent, mild: Secondary | ICD-10-CM

## 2020-04-16 NOTE — Progress Notes (Signed)
Virtual Visit via Video Note  I connected with Alice Bowers on 04/16/20 at  9:00 AM EDT by a video enabled telemedicine application and verified that I am speaking with the correct person using two identifiers.  Location: Patient: home Provider: office   I discussed the limitations of evaluation and management by telemedicine and the availability of in person appointments. The patient expressed understanding and agreed to proceed.   Type of Therapy: Individual Therapy   Treatment Goals addressed: "I need somebody to talk to about my problems so I won't get snappy with the other people in my life"   Interventions: MI   Summary: Alice Bowers is a 69 y.o. female who presents with MDD. mild   Suicidal/Homicidal: Nowithout intent/plan   Therapist Response: Alice Bowers met with clinician for an individual session. Alice Bowers discussed her psychiatric symptoms, her current life events and her goals for therapy. Alice Bowers shared that she was fired from her job after she had already given notice. Clinician processed the incident and noted that it seemed she was turned in for sleeping on the job. Clinician explored Alice Bowers story and the impact of losing her job. Alice Bowers identified that it is fine because she had already found a new job and was waiting to be able to start. Clinician discussed new job and utilized MI OARS to reflect the significant difference in stress from the old job. 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 Clinician explored Alice Bowers ability to drive for long hours and discussed her plan to maintain her energy levels throughout the day.   Alice Bowers continued to process concerns about lumps on her thyroid and identified that she will not be able to be seen until October. Clinician encouraged Alice Bowers to continue to check back with the office if there is a cancellation.    Plan: Return again in 2-3 weeks.   Diagnosis:  MDD, mild  I discussed the assessment and treatment plan with the patient. The patient was provided an opportunity to ask questions and all were answered. The patient agreed with the plan and demonstrated an understanding of the instructions.   The patient was advised to call back or seek an in-person evaluation if the symptoms worsen or if the condition fails to improve as anticipated.  I provided 45 minutes of non-face-to-face time during this encounter.   Alice Curling, LCSW

## 2020-04-29 ENCOUNTER — Other Ambulatory Visit: Payer: Self-pay

## 2020-04-29 ENCOUNTER — Telehealth (INDEPENDENT_AMBULATORY_CARE_PROVIDER_SITE_OTHER): Payer: Medicare PPO | Admitting: Psychiatry

## 2020-04-29 ENCOUNTER — Encounter (HOSPITAL_COMMUNITY): Payer: Self-pay | Admitting: Psychiatry

## 2020-04-29 VITALS — Wt 248.0 lb

## 2020-04-29 DIAGNOSIS — F419 Anxiety disorder, unspecified: Secondary | ICD-10-CM | POA: Diagnosis not present

## 2020-04-29 DIAGNOSIS — F331 Major depressive disorder, recurrent, moderate: Secondary | ICD-10-CM

## 2020-04-29 MED ORDER — ESCITALOPRAM OXALATE 20 MG PO TABS: 20.0000 mg | ORAL_TABLET | Freq: Every day | ORAL | 2 refills | Status: DC

## 2020-04-29 MED ORDER — BUPROPION HCL ER (SR) 150 MG PO TB12: ORAL_TABLET | ORAL | 2 refills | Status: DC

## 2020-04-29 MED ORDER — LAMOTRIGINE 150 MG PO TABS: 150.0000 mg | ORAL_TABLET | Freq: Every day | ORAL | 2 refills | Status: DC

## 2020-04-29 NOTE — Progress Notes (Signed)
Virtual Visit via Telephone Note  I connected with Alice Bowers on 04/29/20 at  9:40 AM EDT by telephone and verified that I am speaking with the correct person using two identifiers.  Location: Patient: Home Provider: Home office   I discussed the limitations, risks, security and privacy concerns of performing an evaluation and management service by telephone and the availability of in person appointments. I also discussed with the patient that there may be a patient responsible charge related to this service. The patient expressed understanding and agreed to proceed.   History of Present Illness: Patient is evaluated by phone session.  She is taking her medication as prescribed.  She had a new job and she like it.  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  She feels it is less stressed and she denies any major panic attack or anxiety attack.  She is taking Wellbutrin, Lamictal and Lexapro.  She has no tremors shakes or any EPS.  She also takes stimulants from other provider as she tends to sleep during the day.  She has not talked to her sister since May.  She admitted weight gain recently which she contributes to driving most of the time and not doing exercise.  She also takes gabapentin which may be contributing to her weight gain.  She is in therapy with Alice Bowers.  She denies any crying spells, feeling of hopelessness or worthlessness.  Patient told she had a repeat blood work and she was told her kidney function tests are okay.  Her energy level is okay.  Past Psychiatric History: H/Odepression since 1995when goingthrough divorce. H/O4inpatient treatment. H/Osuicidal attempt by taking overdose on Seroquel. No h/oparanoia,psychosisor aggressive behavior. Tried Paxil, Zoloft, Remeron(weight gain)and Seroquel with limited response.   Psychiatric Specialty Exam: Physical Exam  Review of Systems  Weight 248 lb (112.5 kg).There is  no height or weight on file to calculate BMI.  General Appearance: NA  Eye Contact:  NA  Speech:  Clear and Coherent  Volume:  Normal  Mood:  Euthymic  Affect:  NA  Thought Process:  Goal Directed  Orientation:  Full (Time, Place, and Person)  Thought Content:  WDL  Suicidal Thoughts:  No  Homicidal Thoughts:  No  Memory:  Immediate;   Good Recent;   Good Remote;   Good  Judgement:  Intact  Insight:  Present  Psychomotor Activity:  NA  Concentration:  Concentration: Good and Attention Span: Good  Recall:  Good  Fund of Knowledge:  Good  Language:  Good  Akathisia:  No  Handed:  Right  AIMS (if indicated):     Assets:  Communication Skills Desire for Improvement Housing Resilience Talents/Skills Transportation  ADL's:  Intact  Cognition:  WNL  Sleep:   ok     Assessment and Plan: Major depressive disorder, recurrent.  Anxiety.  Patient doing better since she changed her job.  She is less anxious.  She is in therapy with Alice Bowers.  We talked about weight gain and encouraged watching her calorie intake and regular exercise.  She has not taken lorazepam in recent months.  She has no rash or any itching.  We will continue Lamictal 150 mg and recommend to take at bedtime if it makes her sleepy during the day.  Continue Wellbutrin 300 mg in the morning and 150 at bedtime and Lexapro 20 mg daily.  We will consider cutting down the dose if patient continues to do better on her next  appointment.  Recommend to call us back if is any question or any concern.  Follow-up in 3 months.  Follow Up Instructions:    I discussed the assessment and treatment plan with the patient. The patient was provided an opportunity to ask questions and all were answered. The patient agreed with the plan and demonstrated an understanding of the instructions.   The patient was advised to call back or seek an in-person evaluation if the symptoms worsen or if the condition fails to improve as  anticipated.  I provided 17 minutes of non-face-to-face time during this encounter.   Kathlee Nations, MD

## 2020-05-05 ENCOUNTER — Ambulatory Visit (INDEPENDENT_AMBULATORY_CARE_PROVIDER_SITE_OTHER): Payer: Medicare PPO | Admitting: Licensed Clinical Social Worker

## 2020-05-05 ENCOUNTER — Other Ambulatory Visit: Payer: Self-pay

## 2020-05-05 ENCOUNTER — Encounter (HOSPITAL_COMMUNITY): Payer: Self-pay | Admitting: Licensed Clinical Social Worker

## 2020-05-05 DIAGNOSIS — F331 Major depressive disorder, recurrent, moderate: Secondary | ICD-10-CM | POA: Diagnosis not present

## 2020-05-05 NOTE — Progress Notes (Signed)
Virtual Visit via Video Note  I connected with Alice Bowers on 05/05/20 at  9:00 AM EDT by a video enabled telemedicine application and verified that I am speaking with the correct person using two identifiers.  Location: Patient: home Provider: office   I discussed the limitations of evaluation and management by telemedicine and the availability of in person appointments. The patient expressed understanding and agreed to proceed.  Type of Therapy: Individual Therapy   Treatment Goals addressed: "I need somebody to talk to about my problems so I won't get snappy with the other people in my life"   Interventions: MI   Summary: Alice Bowers is a 69 y.o. female who presents with MDD. mild   Suicidal/Homicidal: Nowithout intent/plan   Therapist Response: [name] met with clinician for an individual session. Jonalyn discussed her psychiatric symptoms, her current life events and her goals for therapy. Simaya shared that she has started her new job and things seem to be going well. 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 Clinician explored the change of pace, as well as the significantly lower levels of stress that are present at this time. Clinician discussed the financial benefits of this job and noted that it is possible to make a good paycheck, depending on how many shifts and how far you drive. Cortlynn reports she enjoys driving and has been doing well with this. Clinician explored physical health and noted an upcoming appointment with Ophthalmologist for a procedure, as well as an upcoming meeting with an endocrinologist.    Plan: Return again in 2-3 weeks.   Diagnosis: MDD, mild    I discussed the assessment and treatment plan with the patient. The patient was provided an  opportunity to ask questions and all were answered. The patient agreed with the plan and demonstrated an understanding of the instructions.   The patient was advised to call back or seek an in-person evaluation if the symptoms worsen or if the condition fails to improve as anticipated.  I provided 45 minutes of non-face-to-face time during this encounter.   [name], LCSW

## 2020-05-18 ENCOUNTER — Other Ambulatory Visit: Payer: Self-pay

## 2020-05-18 ENCOUNTER — Ambulatory Visit (HOSPITAL_COMMUNITY)
Admission: RE | Admit: 2020-05-18 | Discharge: 2020-05-18 | Disposition: A | Discharge status: Home / Self Care | Payer: Medicare PPO | Source: Ambulatory Visit | Attending: Emergency Medicine | Admitting: Emergency Medicine

## 2020-05-18 ENCOUNTER — Ambulatory Visit
Admission: EM | Admit: 2020-05-18 | Discharge: 2020-05-18 | Disposition: A | Discharge status: Home / Self Care | Payer: Medicare PPO | Attending: Emergency Medicine | Admitting: Emergency Medicine

## 2020-05-18 DIAGNOSIS — M25571 Pain in right ankle and joints of right foot: Secondary | ICD-10-CM

## 2020-05-18 NOTE — ED Provider Notes (Signed)
[place]   [id number] 05/18/20 Arrival Time: 1039   Chief Complaint  Patient presents with   Ankle Injury    SUBJECTIVE: History from: patient.  Alice Bowers is a 69 y.o. female who presented to the urgent care with a complaint of right ankle pain that started this past Saturday.  Reports she twisted her ankle.  She localizes the pain to the right ankle.  She describes the pain as constant and achy.  She has tried OTC medications without relief.  Her symptoms are made worse with ROM.  She denies similar symptoms in the past.  Denies chills, fever, nausea, vomiting, diarrhea   ROS: As per HPI.  All other pertinent ROS negative.     Past Medical History:  Diagnosis Date   Anxiety    Arthritis    Cancer ([place])    R breast cancer   Depression    Heart murmur    High blood pressure    High cholesterol    Kidney stones    Macular degeneration of left eye    Macular degeneration of right eye    Obstructive sleep apnea    Restless leg syndrome    Past Surgical History:  Procedure Laterality Date   ABDOMINAL HYSTERECTOMY     APPENDECTOMY     arthroscopy left shoulder     CHOLECYSTECTOMY     joint fusion bilateral thumbs     MASTECTOMY Bilateral 2019   ORIF right ankle     REPLACEMENT TOTAL KNEE BILATERAL     Allergies  Allergen Reactions   Azo [Phenazopyridine] Hives   Pyridium [Phenazopyridine Hcl] Hives   Requip [Ropinirole Hcl] Swelling   Betadine [Povidone Iodine] Other (See Comments)    Burn rash   Cephalosporins    Demerol [Meperidine]    Edta [Edetic Acid]    Macrobid [Nitrofurantoin Macrocrystal]    Sulfa Antibiotics    Ceclor [Cefaclor] Rash   Dynacirc [Isradipine] Rash   Keflex [Cephalexin] Rash   Voltaren [Diclofenac Sodium] Anxiety   No current facility-administered medications on file prior to encounter.   Current Outpatient Medications on File Prior to Encounter  Medication Sig Dispense Refill     albuterol (ACCUNEB) 1.25 MG/3ML nebulizer solution Take 1 ampule by nebulization every 6 (six) hours as needed for wheezing.     albuterol (PROVENTIL) (5 MG/ML) 0.5% nebulizer solution Take 0.5 mLs (2.5 mg total) by nebulization every 6 (six) hours as needed for wheezing or shortness of breath. 20 mL 12   Albuterol Sulfate 108 (90 Base) MCG/ACT AEPB Inhale 1-2 puffs into the lungs 4 (four) times daily as needed.      amLODipine (NORVASC) 2.5 MG tablet Take 2.5 mg by mouth daily.      anastrozole (ARIMIDEX) 1 MG tablet Take 1 mg by mouth daily.     Armodafinil 250 MG tablet Take 1 tablet by mouth daily.     atorvastatin (LIPITOR) 40 MG tablet Take 40 mg by mouth daily.     buPROPion (WELLBUTRIN SR) 150 MG 12 hr tablet TAKE 2 TABLETS BY MOUTH EVERY MORNING AND 1 TABLET AT BEDTIME 90 tablet 2   calcium-vitamin D 250-100 MG-UNIT tablet Take 1 tablet by mouth daily.     cetirizine (ZYRTEC) 10 MG tablet Take 10 mg by mouth daily as needed for allergies.     diclofenac Sodium (VOLTAREN) 1 % GEL Apply 1 g topically 4 (four) times daily as needed.     doxycycline (VIBRAMYCIN) 100 MG capsule Take  1 capsule (100 mg total) by mouth 2 (two) times daily. 20 capsule 0   escitalopram (LEXAPRO) 20 MG tablet Take 1 tablet (20 mg total) by mouth daily. 30 tablet 2   fluticasone (FLONASE) 50 MCG/ACT nasal spray Place 1 spray into both nostrils daily as needed.     fluticasone furoate-vilanterol (BREO ELLIPTA) 200-25 MCG/INH AEPB Inhale 1 puff into the lungs daily.     folic acid (FOLVITE) 1 MG tablet Take 1 mg by mouth daily.     furosemide (LASIX) 20 MG tablet Take 20 mg by mouth daily as needed for fluid.      gabapentin (NEURONTIN) 300 MG capsule Take 300 mg by mouth 3 (three) times daily.      hydrochlorothiazide (HYDRODIURIL) 25 MG tablet Take 25 mg by mouth daily.     lamoTRIgine (LAMICTAL) 150 MG tablet Take 1 tablet (150 mg total) by mouth daily. 30 tablet 2   LORazepam (ATIVAN) 0.5  MG tablet Take 1 tablet (0.5 mg total) by mouth daily as needed for anxiety (panic attack). 15 tablet 0   losartan (COZAAR) 100 MG tablet Take 100 mg by mouth daily.     meloxicam (MOBIC) 15 MG tablet Take 15 mg by mouth daily as needed.      Multiple Vitamin (MULTIVITAMIN WITH MINERALS) TABS tablet Take 1 tablet by mouth daily.     Omega-3 Fatty Acids (FISH OIL) 1000 MG CAPS Take 1 capsule by mouth daily.     pramipexole (MIRAPEX) 0.5 MG tablet Take 0.5 mg by mouth 2 (two) times daily.     predniSONE (DELTASONE) 20 MG tablet Take 2 tablets (40 mg total) by mouth daily. 10 tablet 0   tiZANidine (ZANAFLEX) 4 MG tablet Take 2 mg by mouth 2 (two) times daily as needed for muscle spasms.      [DISCONTINUED] allopurinol (ZYLOPRIM) 300 MG tablet Take 300 mg by mouth daily.     Social History   Socioeconomic History   Marital status: Widowed    Spouse name: Not on file   Number of children: Not on file   Years of education: Not on file   Highest education level: Not on file  Occupational History   Not on file  Tobacco Use   Smoking status: Current Some Day Smoker    Packs/day: 1.00    Years: 20.00    Pack years: 20.00    Types: Cigarettes   Smokeless tobacco: Never Used  Vaping Use   Vaping Use: Former  Substance and Sexual Activity   Alcohol use: Not Currently   Drug use: No   Sexual activity: Not Currently  Other Topics Concern   Not on file  Social History Narrative   Not on file   Social Determinants of Health   Financial Resource Strain:    Difficulty of Paying Living Expenses: Not on file  Food Insecurity:    Worried About Charity fundraiser in the Last Year: Not on file   [employer] of Food in the Last Year: Not on file  Transportation Needs:    Lack of Transportation (Medical): Not on file   Lack of Transportation (Non-Medical): Not on file  Physical Activity:    Days of Exercise per Week: Not on file   Minutes of Exercise per Session: Not  on file  Stress:    Feeling of Stress : Not on file  Social Connections:    Frequency of Communication with Friends and Family: Not on file  Frequency of Social Gatherings with Friends and Family: Not on file   Attends Religious Services: Not on file   Active Member of Clubs or Organizations: Not on file   Attends Archivist Meetings: Not on file   Marital Status: Not on file  Intimate Partner Violence:    Fear of Current or Ex-Partner: Not on file   Emotionally Abused: Not on file   Physically Abused: Not on file   Sexually Abused: Not on file   Family History  Problem Relation Age of Onset   Depression Mother    Hyperlipidemia Mother    Hypertension Mother    Depression Brother    Hyperlipidemia Father    Hypertension Father    Asthma Father     OBJECTIVE:  Vitals:   05/18/20 1052  BP: 117/77  Pulse: 85  Resp: 20  Temp: 97.6 F (36.4 C)  SpO2: 95%     Physical Exam Vitals and nursing note reviewed.  Constitutional:      General: She is not in acute distress.    Appearance: Normal appearance. She is normal weight. She is not ill-appearing, toxic-appearing or diaphoretic.  HENT:     Head: Normocephalic.  Cardiovascular:     Rate and Rhythm: Normal rate and regular rhythm.     Pulses: Normal pulses.     Heart sounds: Normal heart sounds. No murmur heard.  No friction rub. No gallop.   Pulmonary:     Effort: Pulmonary effort is normal. No respiratory distress.     Breath sounds: Normal breath sounds. No stridor. No wheezing, rhonchi or rales.  Chest:     Chest wall: No tenderness.  Musculoskeletal:        General: Tenderness present.     Right lower leg: 1+ Edema present.     Left lower leg: 1+ Edema present.     Comments: The right ankle is with obvious deformity when compared to the left ankle.  Swelling present bilaterally.  No ecchymosis, open wound, lesion, surface trauma or warmth present.  Limited range of motion due to  pain.  Neurovascular status intact.  Neurological:     Mental Status: She is alert and oriented to person, place, and time.     LABS:  No results found for this or any previous visit (from the past 24 hour(s)).   ASSESSMENT & PLAN:  1. Acute right ankle pain     No orders of the defined types were placed in this encounter.   Discharge instructions  Take OTC Tylenol/ibuprofen as needed for pain Complete x-ray there was order.  We will call you with abnormal result. Follow RICE instruction that is attached Follow-up with PCP/orthopedic Return or go to ED for worsening of symptoms   Reviewed expectations re: course of current medical issues. Questions answered. Outlined signs and symptoms indicating need for more acute intervention. Patient verbalized understanding. After Visit Summary given.         Emerson Monte, FNP 05/18/20 1130

## 2020-05-18 NOTE — ED Triage Notes (Signed)
Pt turned right ankle while walking Saturday and also fell, has hardware from previous injury

## 2020-05-18 NOTE — Discharge Instructions (Addendum)
Take OTC Tylenol/ibuprofen as needed for pain Complete x-ray there was order.  We will call you with abnormal result. Follow RICE instruction that is attached Follow-up with PCP/orthopedic Return or go to ED for worsening of symptoms

## 2020-05-21 ENCOUNTER — Other Ambulatory Visit: Payer: Self-pay

## 2020-05-21 ENCOUNTER — Encounter (HOSPITAL_COMMUNITY): Payer: Self-pay | Admitting: Licensed Clinical Social Worker

## 2020-05-21 ENCOUNTER — Ambulatory Visit (INDEPENDENT_AMBULATORY_CARE_PROVIDER_SITE_OTHER): Payer: Medicare PPO | Admitting: Licensed Clinical Social Worker

## 2020-05-21 DIAGNOSIS — F331 Major depressive disorder, recurrent, moderate: Secondary | ICD-10-CM | POA: Diagnosis not present

## 2020-05-21 NOTE — Progress Notes (Signed)
Virtual Visit via Video Note  I connected with Alice Bowers on 05/21/20 at  9:00 AM EDT by a video enabled telemedicine application and verified that I am speaking with the correct person using two identifiers.  Location: Patient: home Provider: office   I discussed the limitations of evaluation and management by telemedicine and the availability of in person appointments. The patient expressed understanding and agreed to proceed.  Type of Therapy: Individual Therapy   Treatment Goals addressed: "I need somebody to talk to about my problems so I won't get snappy with the other people in my life"   Interventions: MI   Summary: Alice Bowers is a 69 y.o. female who presents with MDD. mild   Suicidal/Homicidal: Nowithout intent/plan   Therapist Response: Alice Bowers met with clinician for an individual session. Afiya discussed her psychiatric symptoms, her current life events and her goals for therapy. 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 Clinician utilized MI OARS to reflect and summarize thoughts and feelings about the new job. Clinician explored pros and cons to the job, noting that the driving is fine and the money is good enough. Clinician discussed ways for Sehaj to get increased motivation to clean up her house. Clinician processed ways to start cleaning, by choosing one spot at a time to clear up.     Plan: Return again in 2-3 weeks.   Diagnosis: MDD, mild       I discussed the assessment and treatment plan with the patient. The patient was provided an opportunity to ask questions and all were answered. The patient agreed with the plan and demonstrated an understanding of the instructions.   The patient was advised to call back or seek an in-person evaluation if the symptoms worsen or if the condition fails to improve as anticipated.  I provided 45 minutes of non-face-to-face time during this  encounter.   Alice Curling, Alice Bowers

## 2020-06-04 ENCOUNTER — Encounter (HOSPITAL_COMMUNITY): Payer: Self-pay | Admitting: Licensed Clinical Social Worker

## 2020-06-04 ENCOUNTER — Ambulatory Visit (INDEPENDENT_AMBULATORY_CARE_PROVIDER_SITE_OTHER): Payer: Medicare PPO | Admitting: Licensed Clinical Social Worker

## 2020-06-04 ENCOUNTER — Other Ambulatory Visit: Payer: Self-pay

## 2020-06-04 DIAGNOSIS — F331 Major depressive disorder, recurrent, moderate: Secondary | ICD-10-CM

## 2020-06-04 NOTE — Progress Notes (Signed)
Virtual Visit via Telephone Note  I connected with Alice Bowers on 06/04/20 at  9:00 AM EDT by telephone and verified that I am speaking with the correct person using two identifiers.  Location: Patient: home Provider: office   I discussed the limitations, risks, security and privacy concerns of performing an evaluation and management service by telephone and the availability of in person appointments. I also discussed with the patient that there may be a patient responsible charge related to this service. The patient expressed understanding and agreed to proceed.  Type of Therapy: Individual Therapy   Treatment Goals addressed: "I need somebody to talk to about my problems so I won't get snappy with the other people in my life"   Interventions: MI   Summary: Sharlyne Koeneman is a 69 y.o. female who presents with MDD. mild   Suicidal/Homicidal: Nowithout intent/plan   Therapist Response: [name] met with clinician for an individual session. Razia discussed her psychiatric symptoms, her current life events and her goals for therapy. 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 However, she reports that she has been working too much and was feeling very tired today. Clinician explored reasons for picking up extra shifts and encouraged Kourtni to remind herself to slow down and listen to her body. Clinician identified recent car trouble and a repair that will be quite pricey. Clinician encouraged Chinita to budget and save the money, rather than use to money that was originally allocated to pay a bill. Clinician discussed the importance of work/life balance. Clinician also encouraged self care at anytime, by taking her camera in the car to take photos of beautiful scenery she comes across in her travels.    Plan: Return again in 2-3 weeks.   Diagnosis: MDD, mild   I discussed the assessment and treatment plan with the patient. The patient was provided an opportunity to ask  questions and all were answered. The patient agreed with the plan and demonstrated an understanding of the instructions.   The patient was advised to call back or seek an in-person evaluation if the symptoms worsen or if the condition fails to improve as anticipated.  I provided 38 minutes of non-face-to-face time during this encounter.   [name], LCSW

## 2020-06-05 ENCOUNTER — Other Ambulatory Visit: Payer: Self-pay

## 2020-06-05 ENCOUNTER — Ambulatory Visit
Admission: EM | Admit: 2020-06-05 | Discharge: 2020-06-05 | Disposition: A | Discharge status: Home / Self Care | Payer: Medicare PPO | Attending: Emergency Medicine | Admitting: Emergency Medicine

## 2020-06-05 DIAGNOSIS — R300 Dysuria: Secondary | ICD-10-CM | POA: Diagnosis present

## 2020-06-05 DIAGNOSIS — N390 Urinary tract infection, site not specified: Secondary | ICD-10-CM | POA: Diagnosis not present

## 2020-06-05 LAB — POCT URINALYSIS DIP (MANUAL ENTRY)
Bilirubin, UA: NEGATIVE
Glucose, UA: NEGATIVE mg/dL
Ketones, POC UA: NEGATIVE mg/dL
Nitrite, UA: POSITIVE — AB
Protein Ur, POC: 30 mg/dL — AB
Spec Grav, UA: 1.02 (ref 1.010–1.025)
Urobilinogen, UA: 1 E.U./dL
pH, UA: 8.5 — AB (ref 5.0–8.0)

## 2020-06-05 MED ORDER — CIPROFLOXACIN HCL 500 MG PO TABS
500.0000 mg | ORAL_TABLET | Freq: Every day | ORAL | 0 refills | Status: AC
Start: 2020-06-05 — End: 2020-06-15

## 2020-06-05 NOTE — ED Provider Notes (Signed)
[facility]   Chief Complaint  Patient presents with  . Dysuria     SUBJECTIVE:  Alice Bowers is a 69 y.o. female who presented to the urgent care with a complaint of dysuria for the past 2 to 3 days.  Patient denies a precipitating event, recent sexual encounter, excessive caffeine intake.  Denies abdomen/flank pain.  Has tried OTC medications without relief.  Symptoms are made worse with urination.  Admits to similar symptoms in the past.  Denies fever, chills, nausea, vomiting, abdominal pain, flank pain, abnormal vaginal discharge or bleeding, hematuria.    LMP: No LMP recorded. Patient has had a hysterectomy.  ROS: As in HPI.  All other pertinent ROS negative.     Past Medical History:  Diagnosis Date  . Anxiety   . Arthritis   . Cancer [facility])    R breast cancer  . Depression   . Heart murmur   . High blood pressure   . High cholesterol   . Kidney stones   . Macular degeneration of left eye   . Macular degeneration of right eye   . Obstructive sleep apnea   . Restless leg syndrome    Past Surgical History:  Procedure Laterality Date  . ABDOMINAL HYSTERECTOMY    . APPENDECTOMY    . arthroscopy left shoulder    . CHOLECYSTECTOMY    . joint fusion bilateral thumbs    . MASTECTOMY Bilateral 2019  . ORIF right ankle    . REPLACEMENT TOTAL KNEE BILATERAL     Allergies  Allergen Reactions  . Azo [Phenazopyridine] Hives  . Pyridium [Phenazopyridine Hcl] Hives  . Requip [Ropinirole Hcl] Swelling  . Betadine [Povidone Iodine] Other (See Comments)    Burn rash  . Cephalosporins   . Demerol [Meperidine]   . Edta [Edetic Acid]   . Macrobid [Nitrofurantoin Macrocrystal]   . Sulfa Antibiotics   . Ceclor [Cefaclor] Rash  . Dynacirc [Isradipine] Rash  . Keflex [Cephalexin] Rash  . Voltaren [Diclofenac Sodium] Anxiety   No current facility-administered medications on file prior to encounter.   Current Outpatient Medications on File Prior to Encounter    Medication Sig Dispense Refill  . albuterol (ACCUNEB) 1.25 MG/3ML nebulizer solution Take 1 ampule by nebulization every 6 (six) hours as needed for wheezing.    Marland Kitchen albuterol (PROVENTIL) (5 MG/ML) 0.5% nebulizer solution Take 0.5 mLs (2.5 mg total) by nebulization every 6 (six) hours as needed for wheezing or shortness of breath. 20 mL 12  . Albuterol Sulfate 108 (90 Base) MCG/ACT AEPB Inhale 1-2 puffs into the lungs 4 (four) times daily as needed.     Marland Kitchen amLODipine (NORVASC) 2.5 MG tablet Take 2.5 mg by mouth daily.     Marland Kitchen anastrozole (ARIMIDEX) 1 MG tablet Take 1 mg by mouth daily.    . Armodafinil 250 MG tablet Take 1 tablet by mouth daily.    Marland Kitchen atorvastatin (LIPITOR) 40 MG tablet Take 40 mg by mouth daily.    Marland Kitchen buPROPion (WELLBUTRIN SR) 150 MG 12 hr tablet TAKE 2 TABLETS BY MOUTH EVERY MORNING AND 1 TABLET AT BEDTIME 90 tablet 2  . calcium-vitamin D 250-100 MG-UNIT tablet Take 1 tablet by mouth daily.    . cetirizine (ZYRTEC) 10 MG tablet Take 10 mg by mouth daily as needed for allergies.    Marland Kitchen diclofenac Sodium (VOLTAREN) 1 % GEL Apply 1 g topically 4 (four) times daily as needed.    . doxycycline (VIBRAMYCIN) 100 MG capsule Take  1 capsule (100 mg total) by mouth 2 (two) times daily. 20 capsule 0  . escitalopram (LEXAPRO) 20 MG tablet Take 1 tablet (20 mg total) by mouth daily. 30 tablet 2  . fluticasone (FLONASE) 50 MCG/ACT nasal spray Place 1 spray into both nostrils daily as needed.    . fluticasone furoate-vilanterol (BREO ELLIPTA) 200-25 MCG/INH AEPB Inhale 1 puff into the lungs daily.    . folic acid (FOLVITE) 1 MG tablet Take 1 mg by mouth daily.    . furosemide (LASIX) 20 MG tablet Take 20 mg by mouth daily as needed for fluid.     Marland Kitchen gabapentin (NEURONTIN) 300 MG capsule Take 300 mg by mouth 3 (three) times daily.     . hydrochlorothiazide (HYDRODIURIL) 25 MG tablet Take 25 mg by mouth daily.    Marland Kitchen lamoTRIgine (LAMICTAL) 150 MG tablet Take 1 tablet (150 mg total) by mouth daily. 30  tablet 2  . LORazepam (ATIVAN) 0.5 MG tablet Take 1 tablet (0.5 mg total) by mouth daily as needed for anxiety (panic attack). 15 tablet 0  . losartan (COZAAR) 100 MG tablet Take 100 mg by mouth daily.    . meloxicam (MOBIC) 15 MG tablet Take 15 mg by mouth daily as needed.     . Multiple Vitamin (MULTIVITAMIN WITH MINERALS) TABS tablet Take 1 tablet by mouth daily.    . Omega-3 Fatty Acids (FISH OIL) 1000 MG CAPS Take 1 capsule by mouth daily.    . pramipexole (MIRAPEX) 0.5 MG tablet Take 0.5 mg by mouth 2 (two) times daily.    . predniSONE (DELTASONE) 20 MG tablet Take 2 tablets (40 mg total) by mouth daily. 10 tablet 0  . tiZANidine (ZANAFLEX) 4 MG tablet Take 2 mg by mouth 2 (two) times daily as needed for muscle spasms.     . [DISCONTINUED] allopurinol (ZYLOPRIM) 300 MG tablet Take 300 mg by mouth daily.     Social History   Socioeconomic History  . Marital status: Widowed    Spouse name: Not on file  . Number of children: Not on file  . Years of education: Not on file  . Highest education level: Not on file  Occupational History  . Not on file  Tobacco Use  . Smoking status: Current Some Day Smoker    Packs/day: 1.00    Years: 20.00    Pack years: 20.00    Types: Cigarettes  . Smokeless tobacco: Never Used  Vaping Use  . Vaping Use: Former  Substance and Sexual Activity  . Alcohol use: Not Currently  . Drug use: No  . Sexual activity: Not Currently  Other Topics Concern  . Not on file  Social History Narrative  . Not on file   Social Determinants of Health   Financial Resource Strain:   . Difficulty of Paying Living Expenses: Not on file  Food Insecurity:   . Worried About Charity fundraiser in the Last Year: Not on file  . Ran Out of Food in the Last Year: Not on file  Transportation Needs:   . Lack of Transportation (Medical): Not on file  . Lack of Transportation (Non-Medical): Not on file  Physical Activity:   . Days of Exercise per Week: Not on file  .  Minutes of Exercise per Session: Not on file  Stress:   . Feeling of Stress : Not on file  Social Connections:   . Frequency of Communication with Friends and Family: Not on file  .  Frequency of Social Gatherings with Friends and Family: Not on file  . Attends Religious Services: Not on file  . Active Member of Clubs or Organizations: Not on file  . Attends Archivist Meetings: Not on file  . Marital Status: Not on file  Intimate Partner Violence:   . Fear of Current or Ex-Partner: Not on file  . Emotionally Abused: Not on file  . Physically Abused: Not on file  . Sexually Abused: Not on file   Family History  Problem Relation Age of Onset  . Depression Mother   . Hyperlipidemia Mother   . Hypertension Mother   . Depression Brother   . Hyperlipidemia Father   . Hypertension Father   . Asthma Father     OBJECTIVE:  Vitals:   06/05/20 1040  BP: (!) 153/84  Pulse: 90  Resp: 17  Temp: 97.7 F (36.5 C)  SpO2: 97%   General appearance: AOx3 in no acute distress HEENT: NCAT.  Oropharynx clear.  Lungs: clear to auscultation bilaterally without adventitious breath sounds Heart: regular rate and rhythm.  Radial pulses 2+ symmetrical bilaterally Abdomen: soft; non-distended; no tenderness; bowel sounds present; no guarding or rebound tenderness Back: no CVA tenderness Extremities: no edema; symmetrical with no gross deformities Skin: warm and dry Neurologic: Ambulates from chair to exam table without difficulty Psychological: alert and cooperative; normal mood and affect  Labs Reviewed  POCT URINALYSIS DIP (MANUAL ENTRY) - Abnormal; Notable for the following components:      Result Value   Clarity, UA turbid (*)    Blood, UA small (*)    pH, UA 8.5 (*)    Protein Ur, POC =30 (*)    Nitrite, UA Positive (*)    Leukocytes, UA Moderate (2+) (*)    All other components within normal limits  URINE CULTURE    ASSESSMENT & PLAN:  1. Dysuria   2. Acute lower  UTI     Meds ordered this encounter  Medications  . ciprofloxacin (CIPRO) 500 MG tablet    Sig: Take 1 tablet (500 mg total) by mouth daily for 10 days.    Dispense:  10 tablet    Refill:  0   Discharge instructions  Urine culture sent.  We will call you with the results.   Push fluids and get plenty of rest.   Take antibiotic as directed and to completion Follow up with PCP if symptoms persists Return here or go to ER if you have any new or worsening symptoms such as fever, worsening abdominal pain, nausea/vomiting, flank pain, etc...  Outlined signs and symptoms indicating need for more acute intervention. Patient verbalized understanding. After Visit Summary given.     Emerson Monte, [place] 06/05/20 1105

## 2020-06-05 NOTE — ED Triage Notes (Signed)
Pt presents with c/o dysuria for past 3 days, also c/o bilateral ankle swelling post chemo treatment last year

## 2020-06-05 NOTE — Discharge Instructions (Addendum)
Urine culture sent.  We will call you with the results.   Push fluids and get plenty of rest.   Take antibiotic as directed and to completion Follow up with PCP if symptoms persists Return here or go to ER if you have any new or worsening symptoms such as fever, worsening abdominal pain, nausea/vomiting, flank pain, etc... 

## 2020-06-08 LAB — URINE CULTURE: Culture: >=100000 — AB

## 2020-06-18 ENCOUNTER — Ambulatory Visit (HOSPITAL_COMMUNITY): Payer: Medicare PPO | Admitting: Licensed Clinical Social Worker

## 2020-06-18 ENCOUNTER — Other Ambulatory Visit: Payer: Self-pay

## 2020-07-02 ENCOUNTER — Other Ambulatory Visit: Payer: Self-pay

## 2020-07-02 ENCOUNTER — Ambulatory Visit (HOSPITAL_COMMUNITY): Payer: Medicare PPO | Admitting: Licensed Clinical Social Worker

## 2020-07-16 ENCOUNTER — Ambulatory Visit (INDEPENDENT_AMBULATORY_CARE_PROVIDER_SITE_OTHER): Payer: Medicare PPO | Admitting: Licensed Clinical Social Worker

## 2020-07-16 ENCOUNTER — Other Ambulatory Visit: Payer: Self-pay

## 2020-07-16 ENCOUNTER — Encounter (HOSPITAL_COMMUNITY): Payer: Self-pay | Admitting: Licensed Clinical Social Worker

## 2020-07-16 DIAGNOSIS — F331 Major depressive disorder, recurrent, moderate: Secondary | ICD-10-CM | POA: Diagnosis not present
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
## 2020-07-24 ENCOUNTER — Encounter: Payer: Self-pay | Admitting: Podiatry

## 2020-07-24 ENCOUNTER — Ambulatory Visit: Payer: Medicare PPO | Admitting: Podiatry

## 2020-07-24 ENCOUNTER — Other Ambulatory Visit: Payer: Self-pay

## 2020-07-24 DIAGNOSIS — M21622 Bunionette of left foot: Secondary | ICD-10-CM

## 2020-07-24 DIAGNOSIS — L84 Corns and callosities: Secondary | ICD-10-CM | POA: Diagnosis not present

## 2020-07-24 DIAGNOSIS — L600 Ingrowing nail: Secondary | ICD-10-CM | POA: Diagnosis not present

## 2020-07-24 DIAGNOSIS — M21621 Bunionette of right foot: Secondary | ICD-10-CM | POA: Diagnosis not present

## 2020-07-24 NOTE — Patient Instructions (Signed)

## 2020-07-25 NOTE — Progress Notes (Signed)
Subjective:   Patient ID: Alice Bowers, female   DOB: 69 y.o.   MRN: [id number]   HPI Patient has had chemotherapy and has had thick deformed hallux nails bilateral that are painful when pressed and also has developed pain in the outside of the fifth metatarsals both feet that are hard for her to walk on.  States the nails cannot be cut she has tried soaks she is tried to trim them herself.  Patient does not smoke likes to be active   Review of Systems  All other systems reviewed and are negative.       Objective:  Physical Exam Vitals and nursing note reviewed.  Constitutional:      Appearance: She is well-developed and well-nourished.  Cardiovascular:     Pulses: Intact distal pulses.  Pulmonary:     Effort: Pulmonary effort is normal.  Musculoskeletal:        General: Normal range of motion.  Skin:    General: Skin is warm.  Neurological:     Mental Status: She is alert.     Neurovascular status intact muscle strength was found to be adequate range of motion adequate.  Patient is found to have thickened deformed hallux nail beds bilateral that are painful when pressed and she cannot cut and keratotic lesion subfifth metatarsal head bilateral that are painful and make walking difficult.  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  Explained procedure risk and patient wants surgery and at this time I allowed her to read and then signed consent form understanding risk.  I infiltrated each hallux 60 mg like Marcaine mixture sterile prep done and using sterile instrumentation the hallux nails were removed completely matrix exposed and phenol 5 applications 30 seconds followed by alcohol lavage given to both.  Sterile dressings applied to each toe and I then went ahead and  debrided the lesions fifth metatarsal bilateral with no iatrogenic bleeding.  Leave dressing on 24 hours but take it off earlier if any throbbing were to occur and encouraged her to call us with concerns

## 2020-07-28 ENCOUNTER — Telehealth (HOSPITAL_COMMUNITY): Payer: Medicare PPO | Admitting: Psychiatry

## 2020-07-29 ENCOUNTER — Telehealth (HOSPITAL_COMMUNITY): Payer: Medicare PPO | Admitting: Psychiatry

## 2020-08-03 ENCOUNTER — Ambulatory Visit
Admission: EM | Admit: 2020-08-03 | Discharge: 2020-08-03 | Disposition: A | Discharge status: Home / Self Care | Payer: Medicare PPO | Attending: Urgent Care | Admitting: Urgent Care

## 2020-08-03 ENCOUNTER — Ambulatory Visit (INDEPENDENT_AMBULATORY_CARE_PROVIDER_SITE_OTHER): Payer: Medicare PPO

## 2020-08-03 ENCOUNTER — Other Ambulatory Visit: Payer: Self-pay

## 2020-08-03 DIAGNOSIS — M19011 Primary osteoarthritis, right shoulder: Secondary | ICD-10-CM | POA: Diagnosis not present

## 2020-08-03 DIAGNOSIS — M25511 Pain in right shoulder: Secondary | ICD-10-CM | POA: Diagnosis not present

## 2020-08-03 DIAGNOSIS — G8929 Other chronic pain: Secondary | ICD-10-CM | POA: Diagnosis not present

## 2020-08-03 MED ORDER — TRAMADOL HCL 50 MG PO TABS: 50.0000 mg | ORAL_TABLET | Freq: Four times a day (QID) | ORAL | 0 refills | Status: DC | PRN

## 2020-08-03 MED ORDER — NAPROXEN 375 MG PO TABS
375.0000 mg | ORAL_TABLET | Freq: Two times a day (BID) | ORAL | 0 refills | Status: DC
Start: 2020-08-03 — End: 2021-05-19

## 2020-08-03 MED ORDER — TIZANIDINE HCL 4 MG PO TABS
4.0000 mg | ORAL_TABLET | Freq: Every day | ORAL | 0 refills | Status: DC
Start: 2020-08-03 — End: 2020-08-21

## 2020-08-03 NOTE — Discharge Instructions (Addendum)
Please schedule Tylenol at 500 mg - 650 mg once every 6 hours as needed for aches and pains.  If you still have pain despite taking Tylenol regularly, this is breakthrough pain.  You can use tramadol once every 6 hours for this.  Once your pain is better controlled, switch back to just Tylenol.  Do not use any nonsteroidal anti-inflammatories (NSAIDs) like ibuprofen, Motrin, naproxen, Aleve, etc. which are all available over-the-counter.

## 2020-08-03 NOTE — ED Triage Notes (Signed)
Pt returns with worsening shoulder pain , pt had imaging for same in February , denies any new injury

## 2020-08-03 NOTE — ED Provider Notes (Signed)
[place]   MRN: [id number] DOB: 1951/02/25  Subjective:   Alice Bowers is a 69 y.o. female presenting for 3-day history of acute onset recurrent right shoulder pain.  Denies falls, trauma.  Patient has had very limited range of motion.  Patient has previously had an x-ray within the past year but was negative.  No current facility-administered medications for this encounter.  Current Outpatient Medications:    albuterol (ACCUNEB) 1.25 MG/3ML nebulizer solution, Take 1 ampule by nebulization every 6 (six) hours as needed for wheezing., Disp: , Rfl:    albuterol (PROVENTIL) (5 MG/ML) 0.5% nebulizer solution, Take 0.5 mLs (2.5 mg total) by nebulization every 6 (six) hours as needed for wheezing or shortness of breath., Disp: 20 mL, Rfl: 12   Albuterol Sulfate 108 (90 Base) MCG/ACT AEPB, Inhale 1-2 puffs into the lungs 4 (four) times daily as needed. , Disp: , Rfl:    amLODipine (NORVASC) 2.5 MG tablet, Take 2.5 mg by mouth daily. , Disp: , Rfl:    amoxicillin (AMOXIL) 500 MG capsule, Take 500 mg by mouth 3 (three) times daily., Disp: , Rfl:    anastrozole (ARIMIDEX) 1 MG tablet, Take 1 mg by mouth daily., Disp: , Rfl:    Armodafinil 250 MG tablet, Take 1 tablet by mouth daily., Disp: , Rfl:    atorvastatin (LIPITOR) 40 MG tablet, Take 40 mg by mouth daily., Disp: , Rfl:    buPROPion (WELLBUTRIN SR) 150 MG 12 hr tablet, TAKE 2 TABLETS BY MOUTH EVERY MORNING AND 1 TABLET AT BEDTIME, Disp: 90 tablet, Rfl: 2   calcium-vitamin D 250-100 MG-UNIT tablet, Take 1 tablet by mouth daily., Disp: , Rfl:    cetirizine (ZYRTEC) 10 MG tablet, Take 10 mg by mouth daily as needed for allergies., Disp: , Rfl:    diclofenac Sodium (VOLTAREN) 1 % GEL, Apply 1 g topically 4 (four) times daily as needed., Disp: , Rfl:    doxycycline (VIBRAMYCIN) 100 MG capsule, Take 1 capsule (100 mg total) by mouth 2 (two) times daily., Disp: 20 capsule, Rfl: 0   escitalopram (LEXAPRO) 20 MG  tablet, Take 1 tablet (20 mg total) by mouth daily., Disp: 30 tablet, Rfl: 2   fluticasone (FLONASE) 50 MCG/ACT nasal spray, Place 1 spray into both nostrils daily as needed., Disp: , Rfl:    fluticasone furoate-vilanterol (BREO ELLIPTA) 200-25 MCG/INH AEPB, Inhale 1 puff into the lungs daily., Disp: , Rfl:    folic acid (FOLVITE) 1 MG tablet, Take 1 mg by mouth daily., Disp: , Rfl:    furosemide (LASIX) 20 MG tablet, Take 20 mg by mouth daily as needed for fluid. , Disp: , Rfl:    gabapentin (NEURONTIN) 300 MG capsule, Take 300 mg by mouth 3 (three) times daily. , Disp: , Rfl:    hydrochlorothiazide (HYDRODIURIL) 25 MG tablet, Take 25 mg by mouth daily., Disp: , Rfl:    lamoTRIgine (LAMICTAL) 150 MG tablet, Take 1 tablet (150 mg total) by mouth daily., Disp: 30 tablet, Rfl: 2   LORazepam (ATIVAN) 0.5 MG tablet, Take 1 tablet (0.5 mg total) by mouth daily as needed for anxiety (panic attack)., Disp: 15 tablet, Rfl: 0   losartan (COZAAR) 100 MG tablet, Take 100 mg by mouth daily., Disp: , Rfl:    meloxicam (MOBIC) 15 MG tablet, Take 15 mg by mouth daily as needed. , Disp: , Rfl:    Multiple Vitamin (MULTIVITAMIN WITH MINERALS) TABS tablet, Take 1 tablet by mouth daily., Disp: , Rfl:  Omega-3 Fatty Acids (FISH OIL) 1000 MG CAPS, Take 1 capsule by mouth daily., Disp: , Rfl:    pramipexole (MIRAPEX) 0.5 MG tablet, Take 0.5 mg by mouth 2 (two) times daily., Disp: , Rfl:    predniSONE (DELTASONE) 20 MG tablet, Take 2 tablets (40 mg total) by mouth daily., Disp: 10 tablet, Rfl: 0   tiZANidine (ZANAFLEX) 4 MG tablet, Take 2 mg by mouth 2 (two) times daily as needed for muscle spasms. , Disp: , Rfl:    Allergies  Allergen Reactions   Azo [Phenazopyridine] Hives   Pyridium [Phenazopyridine Hcl] Hives   Requip [Ropinirole Hcl] Swelling   Betadine [Povidone Iodine] Other (See Comments)    Burn rash   Diclofenac Anxiety and Other (See Comments)    Worsens restless legs symptoms.    Cephalosporins    Demerol [Meperidine]    Edta [Edetic Acid]    Macrobid [Nitrofurantoin Macrocrystal]    Sulfa Antibiotics    Ceclor [Cefaclor] Rash   Dynacirc [Isradipine] Rash   Keflex [Cephalexin] Rash   Voltaren [Diclofenac Sodium] Anxiety    Past Medical History:  Diagnosis Date   Anxiety    Arthritis    Cancer ([place])    R breast cancer   Depression    Heart murmur    High blood pressure    High cholesterol    Kidney stones    Macular degeneration of left eye    Macular degeneration of right eye    Obstructive sleep apnea    Restless leg syndrome      Past Surgical History:  Procedure Laterality Date   ABDOMINAL HYSTERECTOMY     APPENDECTOMY     arthroscopy left shoulder     CHOLECYSTECTOMY     joint fusion bilateral thumbs     MASTECTOMY Bilateral 2019   ORIF right ankle     REPLACEMENT TOTAL KNEE BILATERAL      Family History  Problem Relation Age of Onset   Depression Mother    Hyperlipidemia Mother    Hypertension Mother    Depression Brother    Hyperlipidemia Father    Hypertension Father    Asthma Father     Social History   Tobacco Use   Smoking status: Current Some Day Smoker    Packs/day: 1.00    Years: 20.00    Pack years: 20.00    Types: Cigarettes   Smokeless tobacco: Never Used  Scientific laboratory technician Use: Former  Substance Use Topics   Alcohol use: Not Currently   Drug use: No    ROS   Objective:   Vitals: BP (!) 155/73 (BP Location: Left Arm)    Pulse 86    Temp 98.1 F (36.7 C) (Tympanic)    Resp 17    SpO2 96%   Physical Exam Constitutional:      General: She is not in acute distress.    Appearance: Normal appearance. She is well-developed. She is not ill-appearing.  HENT:     Head: Normocephalic and atraumatic.     Nose: Nose normal.     Mouth/Throat:     Mouth: Mucous membranes are moist.     Pharynx: Oropharynx is clear.  Eyes:     General: No scleral icterus.     Extraocular Movements: Extraocular movements intact.     Pupils: Pupils are equal, round, and reactive to light.  Cardiovascular:     Rate and Rhythm: Normal rate.  Pulmonary:     Effort: Pulmonary  effort is normal.  Musculoskeletal:     Right shoulder: Tenderness (unable to examine patient fully as she is guarding significantly; tenderness throughout with even the lightest palpation) and bony tenderness present. No swelling, deformity, effusion, laceration or crepitus. Decreased range of motion. Normal strength.  Skin:    General: Skin is warm and dry.  Neurological:     General: No focal deficit present.     Mental Status: She is alert and oriented to person, place, and time.  Psychiatric:        Mood and Affect: Mood normal.        Behavior: Behavior normal.     DG Shoulder Right  Result Date: 08/03/2020 CLINICAL DATA:  Chronic right shoulder pain. EXAM: RIGHT SHOULDER - 2+ VIEW COMPARISON:  Right shoulder x-rays dated October 06, 2019. FINDINGS: No acute fracture or dislocation. Similar appearing mild acromioclavicular joint space narrowing with small marginal osteophytes. The glenohumeral joint space is preserved. Surgical clips in the right axilla again noted. Visualized right lung is clear. IMPRESSION: 1. Unchanged mild acromioclavicular osteoarthritis. Electronically Signed   By: Titus Dubin M.D.   On: 08/03/2020 14:07   Assessment and Plan :   PDMP not reviewed this encounter.  1. Primary osteoarthritis of right shoulder   2. Acute pain of right shoulder    Patient is having surgery in 2 days.  Recommended against using naproxen.  Schedule Tylenol, use tramadol.  Follow-up with Ortho for further management of her osteoarthritic pain. Counseled patient on potential for adverse effects with medications prescribed/recommended today, ER and return-to-clinic precautions discussed, patient verbalized understanding.     Jaynee Eagles, PA-C 08/03/20 1457

## 2020-08-06 ENCOUNTER — Ambulatory Visit (HOSPITAL_COMMUNITY): Payer: Medicare PPO | Admitting: Licensed Clinical Social Worker

## 2020-08-06 ENCOUNTER — Other Ambulatory Visit: Payer: Self-pay

## 2020-08-10 ENCOUNTER — Ambulatory Visit: Payer: Medicare PPO | Admitting: Podiatry

## 2020-08-10 ENCOUNTER — Other Ambulatory Visit: Payer: Self-pay

## 2020-08-10 ENCOUNTER — Encounter: Payer: Self-pay | Admitting: Podiatry

## 2020-08-10 DIAGNOSIS — L600 Ingrowing nail: Secondary | ICD-10-CM

## 2020-08-10 NOTE — Patient Instructions (Signed)

## 2020-08-11 ENCOUNTER — Telehealth (HOSPITAL_COMMUNITY): Payer: Medicare PPO | Admitting: Psychiatry

## 2020-08-12 NOTE — Progress Notes (Signed)
Subjective:   Patient ID: Alice Bowers, female   DOB: 69 y.o.   MRN: [id number]   HPI Patient states that her left nails are healing excellent and she is here to have 2 toes fixed on the right foot with chronic nail disease and pain   ROS      Objective:  Physical Exam  No neurovascular status intact with the hallux second nail left healing well with ingrown hallux and second nails right that are painful when pressed and make walking and shoe gear difficult     Assessment:  Damaged right hallux and second nails that are painful when pressed with well-healing left hallux second nails     Plan:  H&P reviewed condition and recommended nail removal right.  Explained procedure risk and patient wants surgery and today I infiltrated each toe 60 mg like Marcaine mixture sterile prep done and using sterile instrumentation remove the hallux and second nails exposed matrix applied phenol 3 applications 30 seconds second and 5 applications 30 seconds hallux and applied alcohol lavage and sterile dressings.  Gave instructions for soaks and reappoint for [name] to recheck and encouraged to call with questions

## 2020-08-20 ENCOUNTER — Ambulatory Visit (INDEPENDENT_AMBULATORY_CARE_PROVIDER_SITE_OTHER): Payer: Medicare PPO | Admitting: Licensed Clinical Social Worker

## 2020-08-20 ENCOUNTER — Other Ambulatory Visit: Payer: Self-pay

## 2020-08-20 DIAGNOSIS — F331 Major depressive disorder, recurrent, moderate: Secondary | ICD-10-CM | POA: Diagnosis not present

## 2020-08-21 ENCOUNTER — Telehealth (INDEPENDENT_AMBULATORY_CARE_PROVIDER_SITE_OTHER): Payer: Medicare PPO | Admitting: Psychiatry

## 2020-08-21 ENCOUNTER — Encounter (HOSPITAL_COMMUNITY): Payer: Self-pay | Admitting: Psychiatry

## 2020-08-21 ENCOUNTER — Other Ambulatory Visit: Payer: Self-pay

## 2020-08-21 DIAGNOSIS — F331 Major depressive disorder, recurrent, moderate: Secondary | ICD-10-CM | POA: Diagnosis not present

## 2020-08-21 DIAGNOSIS — F419 Anxiety disorder, unspecified: Secondary | ICD-10-CM | POA: Diagnosis not present

## 2020-08-21 MED ORDER — ESCITALOPRAM OXALATE 20 MG PO TABS: 20.0000 mg | ORAL_TABLET | Freq: Every day | ORAL | 2 refills | Status: DC

## 2020-08-21 MED ORDER — BUPROPION HCL ER (SR) 150 MG PO TB12: ORAL_TABLET | ORAL | 2 refills | Status: DC

## 2020-08-21 MED ORDER — LAMOTRIGINE 150 MG PO TABS: 150.0000 mg | ORAL_TABLET | Freq: Every day | ORAL | 2 refills | Status: DC
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
## 2020-08-24 ENCOUNTER — Encounter (HOSPITAL_COMMUNITY): Payer: Self-pay | Admitting: Licensed Clinical Social Worker

## 2020-09-10 ENCOUNTER — Encounter (HOSPITAL_COMMUNITY): Payer: Self-pay | Admitting: Licensed Clinical Social Worker

## 2020-09-10 ENCOUNTER — Ambulatory Visit (INDEPENDENT_AMBULATORY_CARE_PROVIDER_SITE_OTHER): Payer: Medicare PPO | Admitting: Licensed Clinical Social Worker

## 2020-09-10 ENCOUNTER — Other Ambulatory Visit: Payer: Self-pay

## 2020-09-10 DIAGNOSIS — F331 Major depressive disorder, recurrent, moderate: Secondary | ICD-10-CM | POA: Diagnosis not present
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
## 2020-09-24 ENCOUNTER — Other Ambulatory Visit: Payer: Self-pay

## 2020-09-24 ENCOUNTER — Encounter (HOSPITAL_COMMUNITY): Payer: Self-pay | Admitting: Licensed Clinical Social Worker

## 2020-09-24 ENCOUNTER — Ambulatory Visit (INDEPENDENT_AMBULATORY_CARE_PROVIDER_SITE_OTHER): Payer: Medicare PPO | Admitting: Licensed Clinical Social Worker

## 2020-09-24 DIAGNOSIS — F331 Major depressive disorder, recurrent, moderate: Secondary | ICD-10-CM | POA: Diagnosis not present

## 2020-09-24 NOTE — Progress Notes (Signed)
Virtual Visit via Video Note  I connected with Kanna Dafoe on 09/24/20 at  9:00 AM EST by a video enabled telemedicine application and verified that I am speaking with the correct person using two identifiers.  Location: Patient: home Provider: home office   I discussed the limitations of evaluation and management by telemedicine and the availability of in person appointments. The patient expressed understanding and agreed to proceed.  Type of Therapy: Individual Therapy   Treatment Goals addressed: "I need somebody to talk to about my problems so I won't get snappy with the other people in my life"   Interventions: MI   Summary: Delainey Winstanley is a 70 y.o. female who presents with MDD. moderate   Suicidal/Homicidal: Nowithout intent/plan   Therapist Response: [name] met with clinician for an individual session. Calliope discussed her psychiatric symptoms, her current life events and her goals for therapy. Hajer shared that she is okay, but there have been two deaths in the family over the past two weeks. Clinician processed thoughts and feelings about the loss of her husbands cousin and her son-in-laws father. Clinician provided supportive feedback and provided time and space for Lanitra to process her feelings. Clinician utilized MI OARS to reflect the impact of multiple losses on the grief already present. Clinician also identified the importance of [name] being close to her cousins wife and her son-in-law in order to offer support. Clinician discussed work and noted some improvement in life due to new schedule. Clinician also identified the importance of giving herself time to rest so she is able to manage her household and her job.    Plan: Return again in 2-3 weeks.   Diagnosis: MDD, moderate   I discussed the assessment and treatment plan with the patient. The patient was provided an opportunity to ask questions and all were answered. The patient agreed with the plan and  demonstrated an understanding of the instructions.   The patient was advised to call back or seek an in-person evaluation if the symptoms worsen or if the condition fails to improve as anticipated.  I provided 45 minutes of non-face-to-face time during this encounter.   [name], LCSW

## 2020-10-08 ENCOUNTER — Other Ambulatory Visit: Payer: Self-pay

## 2020-10-08 ENCOUNTER — Ambulatory Visit (INDEPENDENT_AMBULATORY_CARE_PROVIDER_SITE_OTHER): Payer: Medicare PPO | Admitting: Licensed Clinical Social Worker

## 2020-10-08 ENCOUNTER — Encounter (HOSPITAL_COMMUNITY): Payer: Self-pay | Admitting: Licensed Clinical Social Worker

## 2020-10-08 DIAGNOSIS — F330 Major depressive disorder, recurrent, mild: Secondary | ICD-10-CM

## 2020-10-08 NOTE — Progress Notes (Signed)
Virtual Visit via Video Note  I connected with Alice Bowers on 10/08/20 at  9:00 AM EST by a video enabled telemedicine application and verified that I am speaking with the correct person using two identifiers.  Location: Patient: home Provider: home office   I discussed the limitations of evaluation and management by telemedicine and the availability of in person appointments. The patient expressed understanding and agreed to proceed.  Type of Therapy: Individual Therapy   Treatment Goals addressed: "I need somebody to talk to about my problems so I won't get snappy with the other people in my life"   Interventions: MI   Summary: Bintou Lafata is a 70 y.o. female who presents with MDD. mild   Suicidal/Homicidal: Nowithout intent/plan   Therapist Response: [name] met with clinician for an individual session. Cinda discussed her psychiatric symptoms, her current life events and her goals for therapy. Ocean shared that she continues to lose more people in her extended family. Clinician processed the losses shared at last session of her cousin and her son-in-law's father. Inaaya shared that the wife of the cousin also passed away within 2 weeks of her husband. Clinician processed thoughts and feelings about these losses and explored the impact of these with her already present grief over her husband and her daughter. Clinician validated the ongoing urge and need to cry over these losses about once a month or so. Clinician provided psychoeducation about grief and encourage the tears to come as needed, normalizing the long term impact of loss. Clinician explored relationship with granddaughter and son-in-law, noting that there has been more contact recently. Clinician also noted that work has been really positive.     Plan: Return again in 2-3 weeks.   Diagnosis: MDD, mild   I discussed the assessment and treatment plan with the patient. The patient was provided an opportunity to ask  questions and all were answered. The patient agreed with the plan and demonstrated an understanding of the instructions.   The patient was advised to call back or seek an in-person evaluation if the symptoms worsen or if the condition fails to improve as anticipated.  I provided 45 minutes of non-face-to-face time during this encounter.   [name], LCSW

## 2020-10-22 ENCOUNTER — Ambulatory Visit (HOSPITAL_COMMUNITY): Payer: Medicare PPO | Admitting: Licensed Clinical Social Worker

## 2020-11-05 ENCOUNTER — Other Ambulatory Visit: Payer: Self-pay

## 2020-11-05 ENCOUNTER — Ambulatory Visit (HOSPITAL_COMMUNITY): Payer: Medicare PPO | Admitting: Licensed Clinical Social Worker

## 2020-11-05 ENCOUNTER — Other Ambulatory Visit (HOSPITAL_COMMUNITY): Payer: Self-pay | Admitting: Psychiatry

## 2020-11-05 DIAGNOSIS — F419 Anxiety disorder, unspecified: Secondary | ICD-10-CM

## 2020-11-05 DIAGNOSIS — F331 Major depressive disorder, recurrent, moderate: Secondary | ICD-10-CM

## 2020-11-12 ENCOUNTER — Encounter: Payer: Self-pay | Admitting: Podiatry

## 2020-11-12 ENCOUNTER — Ambulatory Visit: Payer: Medicare PPO | Admitting: Podiatry

## 2020-11-12 ENCOUNTER — Other Ambulatory Visit: Payer: Self-pay

## 2020-11-12 DIAGNOSIS — M21622 Bunionette of left foot: Secondary | ICD-10-CM

## 2020-11-12 DIAGNOSIS — M21621 Bunionette of right foot: Secondary | ICD-10-CM

## 2020-11-12 DIAGNOSIS — L600 Ingrowing nail: Secondary | ICD-10-CM

## 2020-11-12 NOTE — Progress Notes (Signed)
Subjective:   Patient ID: Alice Bowers, female   DOB: 70 y.o.   MRN: [id number]   HPI Patient presents stating she is still getting some discomfort with ingrown toenails and also she has pain in the outside of the foot with enlargement of the bone structure   ROS      Objective:  Physical Exam  Neurovascular status unchanged moderate obesity noted varus foot structure noted with a lot of pressure on the outside the left foot with prominent bone structure and history of nail removal with discomfort around the left big toenail that has been removed     Assessment:  Tailor's bunion deformity left along with possibility for some form of inflammatory complex around the left nailbed     Plan:  H&P reviewed both conditions and for the tailor's bunion I do not recommend surgery due to the overall foot structural issues and we will try trimming and padding which was done today and for the nail and the nail removal it does look excellent and it may be just inflammation I applied padding and instructed on topical medications wide shoe mesh material

## 2020-11-23 ENCOUNTER — Telehealth (HOSPITAL_COMMUNITY): Payer: Medicare PPO | Admitting: Psychiatry

## 2020-11-24 ENCOUNTER — Telehealth (INDEPENDENT_AMBULATORY_CARE_PROVIDER_SITE_OTHER): Payer: Medicare PPO | Admitting: Psychiatry

## 2020-11-24 ENCOUNTER — Encounter (HOSPITAL_COMMUNITY): Payer: Self-pay | Admitting: Psychiatry

## 2020-11-24 ENCOUNTER — Other Ambulatory Visit: Payer: Self-pay

## 2020-11-24 DIAGNOSIS — F419 Anxiety disorder, unspecified: Secondary | ICD-10-CM

## 2020-11-24 DIAGNOSIS — F331 Major depressive disorder, recurrent, moderate: Secondary | ICD-10-CM | POA: Diagnosis not present

## 2020-11-24 MED ORDER — LAMOTRIGINE 150 MG PO TABS: 150.0000 mg | ORAL_TABLET | Freq: Every day | ORAL | 2 refills | Status: DC

## 2020-11-24 MED ORDER — ESCITALOPRAM OXALATE 20 MG PO TABS: 20.0000 mg | ORAL_TABLET | Freq: Every day | ORAL | 2 refills | Status: DC

## 2020-11-24 MED ORDER — BUPROPION HCL ER (SR) 150 MG PO TB12
ORAL_TABLET | ORAL | 2 refills | Status: DC
Start: 2020-11-24 — End: 2021-02-09

## 2020-11-24 NOTE — Progress Notes (Signed)
Virtual Visit via Telephone Note  I connected with Alice Bowers on 11/24/20 at 10:20 AM EDT by telephone and verified that I am speaking with the correct person using two identifiers.  Location: Patient: Home Provider: Home Office   I discussed the limitations, risks, security and privacy concerns of performing an evaluation and management service by telephone and the availability of in person appointments. I also discussed with the patient that there may be a patient responsible charge related to this service. The patient expressed understanding and agreed to proceed.   History of Present Illness: Patient is evaluated by phone session.  She is doing well on her medication but admitted sometimes she did forget to take the medication while she is traveling for her job.  She mentioned to 3 times it happens but overall things are going well.  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  She is concerned about her general health and swelling.  Because of lymphedema she has generalized swelling and she is disappointed because she was told it may not go away.  She had gained more than 10 pounds which she believes because of swelling.  She has upcoming appointment with her physician.  She is sleeping good.  She denies any major panic attack.  She like her job because it is keeping her busy.  She also happy because she see frequently her granddaughter who is now 69 years old.  She is in therapy with [name].  She has not been in contact with her sister who lives in [place].  She had a good support from her son-in-law, neighbors.  She denies any crying spells or any feeling of hopelessness or worthlessness.  She denies any suicidal thoughts.  Her appetite is okay but she had gained weight which she believes because of swelling.  Past Psychiatric History: H/Odepression since 1995when goingthrough divorce. H/O4inpatient  treatment. H/Osuicidal attempt by taking overdose on Seroquel. No h/oparanoia,psychosisor aggressive behavior. Tried Paxil, Zoloft, Remeron(weight gain)and Seroquel with limited response.    Psychiatric Specialty Exam: Physical Exam  Review of Systems  Weight 261 lb (118.4 kg).There is no height or weight on file to calculate BMI.  General Appearance: NA  Eye Contact:  NA  Speech:  Slow  Volume:  Decreased  Mood:  Anxious  Affect:  NA  Thought Process:  Goal Directed  Orientation:  Full (Time, Place, and Person)  Thought Content:  Logical  Suicidal Thoughts:  No  Homicidal Thoughts:  No  Memory:  Immediate;   Good Recent;   Good Remote;   Good  Judgement:  Intact  Insight:  Good  Psychomotor Activity:  NA  Concentration:  Concentration: Good and Attention Span: Good  Recall:  Good  Fund of Knowledge:  Good  Language:  Good  Akathisia:  No  Handed:  Right  AIMS (if indicated):     Assets:  Communication Skills Desire for Improvement Housing Resilience Social Support Talents/Skills Transportation  ADL's:  Intact  Cognition:  WNL  Sleep:   ok      Assessment and Plan: Major depressive disorder, recurrent.  Anxiety.  Patient doing better but admitted some time missed the dose but she usually takes when she remember.  Talk about reducing the medication since she is doing better.  She agreed to give a try.  I recommend to cut down the Wellbutrin from 450mg  to 300 mg a day.  I will continue Lamictal  150 mg daily and Lexapro 20 mg daily.  She has not taken Ativan in more than a year.  Encouraged to keep appointment with [name].  Discussed medication side effects and benefits.  She has no rash or any itching.  Follow-up in 3 months.  Follow Up Instructions:    I discussed the assessment and treatment plan with the patient. The patient was provided an opportunity to ask questions and all were answered. The patient agreed with the plan and demonstrated an  understanding of the instructions.   The patient was advised to call back or seek an in-person evaluation if the symptoms worsen or if the condition fails to improve as anticipated.  I provided 18 minutes of non-face-to-face time during this encounter.   Kathlee Nations, MD

## 2020-11-26 ENCOUNTER — Other Ambulatory Visit: Payer: Self-pay

## 2020-11-26 ENCOUNTER — Ambulatory Visit (INDEPENDENT_AMBULATORY_CARE_PROVIDER_SITE_OTHER): Payer: Medicare PPO | Admitting: Licensed Clinical Social Worker

## 2020-11-26 DIAGNOSIS — F330 Major depressive disorder, recurrent, mild: Secondary | ICD-10-CM | POA: Diagnosis not present

## 2020-11-30 ENCOUNTER — Encounter (HOSPITAL_COMMUNITY): Payer: Self-pay | Admitting: Licensed Clinical Social Worker

## 2020-11-30 NOTE — Progress Notes (Signed)
Virtual Visit via Video Note  I connected with Alice Bowers on 11/30/20 at  4:30 PM EDT by a video enabled telemedicine application and verified that I am speaking with the correct person using two identifiers.  Location: Patient: home Provider: home office   I discussed the limitations of evaluation and management by telemedicine and the availability of in person appointments. The patient expressed understanding and agreed to proceed.  Type of Therapy: Individual Therapy   Treatment Goals addressed: "I need somebody to talk to about my problems so I won't get snappy with the other people in my life"   Interventions: MI   Summary: Alice Bowers is a 70 y.o. female who presents with MDD. mild   Suicidal/Homicidal: Nowithout intent/plan   Therapist Response: Alice Bowers met with clinician for an individual session. Alice Bowers discussed her psychiatric symptoms, her current life events and her goals for therapy. 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 Clinician explored updates about work using Alice Bowers. Clinician reflected that she continues to enjoy her job and that it is paying her well enough to pay her bills and have some extra money to spoil her granddaughter. Clinician processed recent interactions with granddaughter and son-in-law. Clinician noted that there has been more contact lately and this has been really positive for Alice Bowers. Alice Bowers reports she has been less depressed overall and things are going as well as she can expect right now.    Plan: Return again in 2-3 weeks.   Diagnosis: MDD, mild     I discussed the assessment and treatment plan with the patient. The patient was provided an opportunity to ask questions and all were answered. The patient agreed with the plan and demonstrated an understanding of the instructions.   The patient was advised to call back or seek an in-person evaluation if the symptoms worsen or if  the condition fails to improve as anticipated.  I provided 45 minutes of non-face-to-face time during this encounter.   [name], LCSW

## 2020-12-23 ENCOUNTER — Ambulatory Visit (INDEPENDENT_AMBULATORY_CARE_PROVIDER_SITE_OTHER): Payer: Medicare PPO | Admitting: Licensed Clinical Social Worker

## 2020-12-23 ENCOUNTER — Encounter (HOSPITAL_COMMUNITY): Payer: Self-pay | Admitting: Licensed Clinical Social Worker

## 2020-12-23 ENCOUNTER — Other Ambulatory Visit: Payer: Self-pay

## 2020-12-23 DIAGNOSIS — F330 Major depressive disorder, recurrent, mild: Secondary | ICD-10-CM | POA: Diagnosis not present

## 2020-12-23 NOTE — Progress Notes (Signed)
Virtual Visit via Video Note  I connected with Meenakshi Sazama on 12/23/20 at  4:30 PM EDT by a video enabled telemedicine application and verified that I am speaking with the correct person using two identifiers.  Location: Patient: home Provider: home office   I discussed the limitations of evaluation and management by telemedicine and the availability of in person appointments. The patient expressed understanding and agreed to proceed.  Type of Therapy: Individual Therapy   Treatment Goals addressed: "I need somebody to talk to about my problems so I won't get snappy with the other people in my life"   Interventions: MI   Summary: Greysen Swanton is a 70 y.o. female who presents with MDD. mild   Suicidal/Homicidal: Nowithout intent/plan   Therapist Response: [name] met with clinician for an individual session. Adajah discussed her psychiatric symptoms, her current life events and her goals for therapy. 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 Clinician explored updates about work and noted that even though she likes her job, she feels tired often. Clinician discussed the pros and cons of the job and noted some flexibility in shifts and her ability to work independently. Clinician explored relationships with son in law and granddaughter. Clinician reflected the improvement in closeness with granddaughter, especially since they have been living with the other grandmother. Davinia shared that she has also been reaching out to other family members and trying more to stay in touch with them.  Mood has been stable. Concerns about thyroid levels, but not being medicated at this point.    Plan: Return again in 2-3 weeks.   Diagnosis: MDD, mild     I discussed the assessment and treatment plan with the patient. The patient was provided an opportunity to ask questions and all were answered. The patient agreed with the plan and demonstrated an understanding of the instructions.    The patient was advised to call back or seek an in-person evaluation if the symptoms worsen or if the condition fails to improve as anticipated.  I provided 45 minutes of non-face-to-face time during this encounter.   [name], LCSW

## 2020-12-26 ENCOUNTER — Other Ambulatory Visit (HOSPITAL_COMMUNITY): Payer: Self-pay | Admitting: Psychiatry

## 2020-12-26 DIAGNOSIS — F331 Major depressive disorder, recurrent, moderate: Secondary | ICD-10-CM

## 2021-01-14 ENCOUNTER — Ambulatory Visit (HOSPITAL_COMMUNITY): Payer: Medicare PPO | Admitting: Licensed Clinical Social Worker

## 2021-01-20 ENCOUNTER — Other Ambulatory Visit: Payer: Self-pay

## 2021-01-20 ENCOUNTER — Ambulatory Visit (INDEPENDENT_AMBULATORY_CARE_PROVIDER_SITE_OTHER): Payer: Medicare PPO | Admitting: Licensed Clinical Social Worker

## 2021-01-20 DIAGNOSIS — F330 Major depressive disorder, recurrent, mild: Secondary | ICD-10-CM

## 2021-01-21 ENCOUNTER — Ambulatory Visit
Admission: EM | Admit: 2021-01-21 | Discharge: 2021-01-21 | Disposition: A | Discharge status: Home / Self Care | Payer: Medicare PPO | Attending: Internal Medicine | Admitting: Internal Medicine

## 2021-01-21 ENCOUNTER — Encounter: Payer: Self-pay | Admitting: Emergency Medicine

## 2021-01-21 ENCOUNTER — Ambulatory Visit (INDEPENDENT_AMBULATORY_CARE_PROVIDER_SITE_OTHER): Payer: Medicare PPO

## 2021-01-21 ENCOUNTER — Other Ambulatory Visit: Payer: Self-pay

## 2021-01-21 DIAGNOSIS — M25562 Pain in left knee: Secondary | ICD-10-CM

## 2021-01-21 MED ORDER — TRAMADOL HCL 50 MG PO TABS: 50.0000 mg | ORAL_TABLET | Freq: Four times a day (QID) | ORAL | 0 refills | Status: DC | PRN

## 2021-01-21 NOTE — Discharge Instructions (Signed)
Gentle range of motion exercises Please take medications as prescribed Keep your appointment with the orthopedic surgeon If you have any worsening symptoms call the orthopedic surgeon to be seen sooner.

## 2021-01-21 NOTE — ED Provider Notes (Addendum)
[facility]    CSN: [id number] Arrival date & time: 01/21/21  1439      History   Chief Complaint Chief Complaint  Patient presents with   Knee Pain    HPI Alice Bowers is a 70 y.o. female with a history of left arthroplasty done about 15 years ago comes to the urgent care with left knee pain of 1 week duration.  Patient denies any falls or trauma to the left knee.  Pain is sharp, throbbing, currently of moderate severity.  No bruising or erythema.  No swelling around the left knee.  Patient is able to bear weight.  No numbness or tingling.  Pain is confined to the anterior medial part of the left knee.   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   Impaired fasting glucose 09/02/2015   Obesity, Class III, BMI 40-49.9 (morbid obesity) ([place]) 05/14/2015   Tobacco dependence 02/11/2015   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/13/2014   Major depressive disorder, recurrent episode, moderate ([place]) 10/03/2014   Recurrent major depressive disorder, in partial remission ([place]) 10/03/2014   OSA on CPAP 02/13/2014   Mixed hyperlipidemia 02/13/2014   Osteoarthritis 02/13/2014   Restless legs syndrome (RLS) 02/13/2014   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      Home Medications    Prior to Admission medications   Medication Sig Start Date End Date Taking? Authorizing Provider  traMADol (ULTRAM) 50 MG tablet Take 1 tablet (50 mg total) by mouth every 6 (six) hours as needed. 01/21/21  Yes Tanda Morrissey, Myrene Galas, MD  albuterol (ACCUNEB) 1.25 MG/3ML nebulizer solution Take 1 ampule by nebulization every 6 (six) hours as needed for wheezing.    [provider]  albuterol (PROVENTIL) (5 MG/ML) 0.5% nebulizer solution Take 0.5 mLs (2.5 mg total) by nebulization every 6 (six) hours as needed for wheezing or shortness of breath. 01/25/20   Noemi Chapel, MD  Albuterol Sulfate 108 (90 Base) MCG/ACT AEPB Inhale 1-2 puffs into the lungs 4 (four) times daily as needed.     [provider]  amLODipine (NORVASC) 2.5 MG tablet Take 2.5 mg by mouth daily.  05/01/19   [provider]  anastrozole (ARIMIDEX) 1 MG tablet Take 1 mg by mouth daily.    [provider]  Armodafinil 250 MG tablet Take 1 tablet by mouth daily. 06/12/19   [provider]  atorvastatin (LIPITOR) 40 MG  tablet Take 40 mg by mouth daily.    Tempie Hoist, MD  buPROPion [facility] SR) 150 MG 12 hr tablet TAKE ONE TABLET BY MOUTH EVERY MORNING AND AT BEDTIME 11/24/20   [name], Arlyce Harman, MD  calcium-vitamin D 250-100 MG-UNIT tablet Take 1 tablet by mouth daily. 03/11/19   [provider]  cetirizine (ZYRTEC) 10 MG tablet Take 10 mg by mouth daily as needed for allergies.    [provider]  diclofenac Sodium (VOLTAREN) 1 % GEL Apply 1 g topically 4 (four) times daily as needed. 11/28/19   [provider]  escitalopram (LEXAPRO) 20 MG tablet Take 1 tablet (20 mg total) by mouth daily. 11/24/20   [name], Arlyce Harman, MD  fluticasone (FLONASE) 50 MCG/ACT nasal spray Place 1 spray into both nostrils daily as needed.  12/13/19   [provider]  fluticasone furoate-vilanterol (BREO ELLIPTA) 200-25 MCG/INH AEPB Inhale 1 puff into the lungs daily.    [provider]  folic acid (FOLVITE) 1 MG tablet Take 1 mg by mouth daily.    Mcarthur Rossetti, MD  furosemide (LASIX) 20 MG tablet Take 20 mg by mouth daily as needed for fluid.  11/05/18   [provider]  gabapentin (NEURONTIN) 300 MG capsule Take 300 mg by mouth 3 (three) times daily.     [provider]  hydrochlorothiazide (HYDRODIURIL) 25 MG tablet Take 25 mg by mouth daily.    [provider]  lamoTRIgine (LAMICTAL) 150 MG tablet Take 1 tablet (150 mg total) by mouth daily. 11/24/20   [name], Arlyce Harman, MD  losartan (COZAAR) 100 MG tablet Take 100 mg by mouth daily.    [provider]  meloxicam (MOBIC) 15 MG tablet Take 15 mg by mouth daily as needed.  12/30/19   [provider]  Multiple Vitamin (MULTIVITAMIN WITH MINERALS) TABS tablet Take 1 tablet by mouth daily.    [provider]  naproxen (NAPROSYN) 375 MG tablet Take 1 tablet (375 mg total) by mouth 2 (two) times daily with a meal. 08/03/20   Jaynee Eagles, PA-C  Omega-3 Fatty Acids (FISH OIL) 1000 MG CAPS Take 1 capsule by mouth daily.    [provider]  pramipexole (MIRAPEX) 0.5 MG tablet Take 0.5 mg by mouth 2 (two) times daily.    Gayla Medicus, MD  allopurinol (ZYLOPRIM) 300 MG tablet Take 300 mg by mouth daily.  10/06/19  Tempie Hoist, MD    Family History Family History  Problem Relation Age of Onset   Depression Mother    Hyperlipidemia Mother    Hypertension Mother    Depression Brother    Hyperlipidemia Father    Hypertension Father    Asthma Father     Social History Social History   Tobacco Use   Smoking status: Some Days    Packs/day: 1.00    Years: 20.00    Pack years: 20.00    Types: Cigarettes   Smokeless tobacco: Never  Vaping Use   Vaping Use: Former  Substance Use Topics   Alcohol use:  Not Currently   Drug use: No     Allergies   Azo [phenazopyridine], Pyridium [phenazopyridine hcl], Requip [ropinirole hcl], Betadine [povidone iodine], Diclofenac, Cephalosporins, Demerol [meperidine], Edta [edetic acid], Macrobid [nitrofurantoin macrocrystal], Sulfa antibiotics, Ceclor [cefaclor], Dynacirc [isradipine], Keflex [cephalexin], and Voltaren [diclofenac sodium]   Review of Systems Review of Systems  Respiratory: Negative.    Gastrointestinal: Negative.   Musculoskeletal:  Positive for arthralgias. Negative for gait  problem, joint swelling and myalgias.  Skin:  Negative for color change, pallor and wound.    Physical Exam Triage Vital Signs ED Triage Vitals  Enc Vitals Group     BP 01/21/21 1532 (!) 147/83     Pulse Rate 01/21/21 1532 (!) 106     Resp 01/21/21 1532 (!) 22     Temp 01/21/21 1532 97.6 F (36.4 C)     Temp Source 01/21/21 1532 Tympanic     SpO2 01/21/21 1532 93 %     Weight --      Height --      Head Circumference --      Peak Flow --      Pain Score 01/21/21 1553 8     Pain Loc --      Pain Edu? --      Excl. in [place]? --    No data found.  Updated Vital Signs BP (!) 147/83 (BP Location: Right Arm)   Pulse (!) 106   Temp 97.6 F (36.4 C) (Tympanic)   Resp (!) 22   SpO2 93%   Visual Acuity Right Eye Distance:   Left Eye Distance:   Bilateral Distance:    Right Eye Near:   Left Eye Near:    Bilateral Near:     Physical Exam Vitals and nursing note reviewed.  Constitutional:      General: She is not in acute distress.    Appearance: She is not ill-appearing.  Cardiovascular:     Rate and Rhythm: Normal rate and regular rhythm.     Pulses: Normal pulses.     Heart sounds: Normal heart sounds.  Abdominal:     General: Abdomen is flat.     Palpations: Abdomen is soft.  Musculoskeletal:        General: Normal range of motion.     Comments: Tenderness over the anteromedial aspect of the left knee.  No bruising or swelling noted.   Skin:    Capillary Refill: Capillary refill takes less than 2 seconds.  Neurological:     Mental Status: She is alert.     UC Treatments / Results  Labs (all labs ordered are listed, but only abnormal results are displayed) Labs Reviewed - No data to display  EKG   Radiology DG Knee 2 Views Left  Result Date: 01/21/2021 CLINICAL DATA:  LEFT knee pain EXAM: LEFT KNEE - 1-2 VIEW COMPARISON:  None. FINDINGS: Total knee arthroplasty. No acute fracture or dislocation. No effusion. IMPRESSION: No acute osseous finding.  Arthroplasty components appear intact. Electronically Signed   By: Suzy Bouchard M.D.   On: 01/21/2021 16:42    Procedures Procedures (including critical care time)  Medications Ordered in UC Medications - No data to display  Initial Impression / Assessment and Plan / UC Course  I have reviewed the triage vital signs and the nursing notes.  Pertinent labs & imaging results that were available during my care of the patient were reviewed by me and considered in my medical decision making (see chart for details).     1.  Left knee pain: X-ray of the left knee is negative for acute fracture.  Hardware seems to be in place Continue using Voltaren cream Tramadol as needed for pain Follow-up with orthopedic surgery appointment scheduled next week Thursday If you have acute worsening of pain please call the orthopedic surgeons office to be seen sooner. Final Clinical Impressions(s) / UC Diagnoses   Final diagnoses:  Acute pain  of left knee     Discharge Instructions      Gentle range of motion exercises Please take medications as prescribed Keep your appointment with the orthopedic surgeon If you have any worsening symptoms call the orthopedic surgeon to be seen sooner.     ED Prescriptions     Medication Sig Dispense Auth. Provider   traMADol (ULTRAM) 50 MG tablet Take 1 tablet (50 mg total) by mouth every 6 (six) hours as needed. 15 tablet Kendrea Cerritos,  Myrene Galas, MD      I have reviewed the PDMP during this encounter.   Chase Picket, MD 01/21/21 1613    Chase Picket, MD 01/21/21 [phone]

## 2021-01-21 NOTE — ED Triage Notes (Signed)
Pt here for left knee pain onset 1 week... 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 NAD.Marland Kitchen. ambulatory

## 2021-01-25 ENCOUNTER — Encounter (HOSPITAL_COMMUNITY): Payer: Self-pay | Admitting: Licensed Clinical Social Worker

## 2021-01-25 NOTE — Progress Notes (Signed)
Virtual Visit via Video Note  I connected with Alice Bowers on 01/25/21 at  4:30 PM EDT by a video enabled telemedicine application and verified that I am speaking with the correct person using two identifiers.  Location: Patient: home Provider: home office   I discussed the limitations of evaluation and management by telemedicine and the availability of in person appointments. The patient expressed understanding and agreed to proceed.   Type of Therapy: Individual Therapy   Treatment Goals addressed: "I need somebody to talk to about my problems so I won't get snappy with the other people in my life"   Interventions: MI   Summary: Alice Bowers is a 70 y.o. female who presents with MDD. mild   Suicidal/Homicidal: Nowithout intent/plan   Therapist Response: Alice Bowers met with clinician for an individual session. Salle discussed her psychiatric symptoms, her current life events and her goals for therapy. Alice Bowers shared that she has been sick due to asthma and has not been working very much. Clinician processed illness and 2 days in hospital due to breathing problems. Clinician discussed coping skills using MI OARS. Clinician reflected the importance of self-care and ensuring that she is getting enough rest, getting up to stretch and stand more often while driving, and eating a healthier diet. Clinician explored relationship with granddaughter and noted that they have been spending more time together and feeling good. Clinician also discussed Alice Bowers's own need for affection and time with her family.    Plan: Return again in 2-3 weeks.   Diagnosis: MDD, mild I discussed the assessment and treatment plan with the patient. The patient was provided an opportunity to ask questions and all were answered. The patient agreed with the plan and demonstrated an understanding of the instructions.   The patient was advised to call back or seek an in-person evaluation if the symptoms worsen or if  the condition fails to improve as anticipated.  I provided 45 minutes of non-face-to-face time during this encounter.   [name], LCSW

## 2021-02-04 ENCOUNTER — Ambulatory Visit (INDEPENDENT_AMBULATORY_CARE_PROVIDER_SITE_OTHER): Payer: Medicare PPO | Admitting: Licensed Clinical Social Worker

## 2021-02-04 ENCOUNTER — Other Ambulatory Visit: Payer: Self-pay

## 2021-02-04 ENCOUNTER — Encounter (HOSPITAL_COMMUNITY): Payer: Self-pay | Admitting: Licensed Clinical Social Worker

## 2021-02-04 DIAGNOSIS — F330 Major depressive disorder, recurrent, mild: Secondary | ICD-10-CM | POA: Diagnosis not present

## 2021-02-04 NOTE — Progress Notes (Signed)
Virtual Visit via Video Note  I connected with Alice Bowers on 02/04/21 at  3:30 PM EDT by a video enabled telemedicine application and verified that I am speaking with the correct person using two identifiers.  Location: Patient: home Provider: home office   I discussed the limitations of evaluation and management by telemedicine and the availability of in person appointments. The patient expressed understanding and agreed to proceed.  Type of Therapy: Individual Therapy   Treatment Goals addressed: "I need somebody to talk to about my problems so I won't get snappy with the other people in my life"   Interventions: MI   Summary: Alice Bowers is a 70 y.o. female who presents with MDD. mild   Suicidal/Homicidal: Nowithout intent/plan   Therapist Response: Alice Bowers met with clinician for an individual session. Alice Bowers discussed her psychiatric symptoms, her current life events and her goals for therapy. Alice Bowers shared ongoing medical issues, currently her leg is giving her a lot of pain. Clinician processed current status and explored how pain has impacted her mood. Alice Bowers shared increased depression and frustration since she has not heard back from her doctor about the MRI that was done earlier this week. Clinician utilized MI OARS to reflect and validated the frustration she feels and encouraged her to continue communicating with the doctor's office. Clinician explored updates in family interactions. Clinician processed coping skills and encouraged Alice Bowers to utilize relaxation techniques to ease her stress levels.    Plan: Return again in 2-3 weeks.   Diagnosis: MDD, mild   I discussed the assessment and treatment plan with the patient. The patient was provided an opportunity to ask questions and all were answered. The patient agreed with the plan and demonstrated an understanding of the instructions.   The patient was advised to call back or seek an in-person evaluation if the  symptoms worsen or if the condition fails to improve as anticipated.  I provided 45 minutes of non-face-to-face time during this encounter.   Alice Curling, LCSW

## 2021-02-09 ENCOUNTER — Encounter (HOSPITAL_COMMUNITY): Payer: Self-pay | Admitting: Psychiatry

## 2021-02-09 ENCOUNTER — Telehealth (INDEPENDENT_AMBULATORY_CARE_PROVIDER_SITE_OTHER): Payer: Medicare PPO | Admitting: Psychiatry

## 2021-02-09 ENCOUNTER — Other Ambulatory Visit: Payer: Self-pay

## 2021-02-09 DIAGNOSIS — F419 Anxiety disorder, unspecified: Secondary | ICD-10-CM

## 2021-02-09 DIAGNOSIS — F331 Major depressive disorder, recurrent, moderate: Secondary | ICD-10-CM | POA: Diagnosis not present

## 2021-02-09 MED ORDER — ESCITALOPRAM OXALATE 20 MG PO TABS: 20.0000 mg | ORAL_TABLET | Freq: Every day | ORAL | 2 refills | Status: DC

## 2021-02-09 MED ORDER — LAMOTRIGINE 150 MG PO TABS: 150.0000 mg | ORAL_TABLET | Freq: Every day | ORAL | 2 refills | Status: DC

## 2021-02-09 MED ORDER — BUPROPION HCL ER (SR) 150 MG PO TB12: ORAL_TABLET | ORAL | 2 refills | Status: DC

## 2021-02-09 NOTE — Progress Notes (Signed)
Virtual Visit via Telephone Note  I connected with Meris Reede on 02/09/21 at 10:20 AM EDT by telephone and verified that I am speaking with the correct person using two identifiers.  Location: Patient: Home Provider: Home Office   I discussed the limitations, risks, security and privacy concerns of performing an evaluation and management service by telephone and the availability of in person appointments. I also discussed with the patient that there may be a patient responsible charge related to this service. The patient expressed understanding and agreed to proceed.   History of Present Illness: Patient is evaluated by phone session.  She reported lately a lot of neck pain.  She told back unable to walk without crutches.  She had visit to the emergency room and had x-rays but doctor could not find anything.  She had a scan which shows hotspots but again no definite diagnosis.  Patient is very concerned because she had stage III breast cancer and afraid if there is a metastasis.  She had contact her oncologist however have not heard from them so far.  For the past 3 weeks she is not able to work because of the pain.  She endorsed increased anxiety and dysphoria because of the current health condition.  She is taking hydrocodone, tramadol, Naprosyn and is still her pain exist.  Her neighbors is very helpful and supportive.  She told usually did drink supper at night and help with the doctor's appointment.  Patient denies any suicidal thoughts but admitted having ruminative and sometimes crying spells.  She wants to get better.  Her appetite is okay but her energy level is low.  Patient denies any paranoia, hallucination.  She admitted not able to see her 35 year old granddaughter in past few weeks because she was busy and away from home.  Patient is taking Wellbutrin, Lexapro and Lamictal.  She has no rash, itching tremors or shakes.  She is hoping once her pain get under control so she can resume  work.  She had a blood work on May 27 and her glucose was 193.  Her BUN/creatinine is normal.  Past Psychiatric History:  H/O depression since 59 when going through divorce.  H/O 4 inpatient treatment. H/O suicidal attempt by taking overdose on Seroquel.  No h/o paranoia, psychosis or aggressive behavior.  Tried Paxil, Zoloft, Remeron (weight gain) and Seroquel with limited response.     Psychiatric Specialty Exam: Physical Exam  Review of Systems  Weight 261 lb (118.4 kg).There is no height or weight on file to calculate BMI.  General Appearance: NA  Eye Contact:  NA  Speech:  Slow  Volume:  Decreased  Mood:  Anxious and Dysphoric  Affect:  NA  Thought Process:  Goal Directed  Orientation:  Full (Time, Place, and Person)  Thought Content:  Rumination  Suicidal Thoughts:  No  Homicidal Thoughts:  No  Memory:  Immediate;   Good Recent;   Good Remote;   Good  Judgement:  Intact  Insight:  Present  Psychomotor Activity:  NA  Concentration:  Concentration: Fair and Attention Span: Fair  Recall:  Good  Fund of Knowledge:  Good  Language:  Good  Akathisia:  No  Handed:  Right  AIMS (if indicated):     Assets:  Communication Skills Desire for Improvement Housing Social Support  ADL's:  Intact  Cognition:  WNL  Sleep:   fair      Assessment and Plan: Major depressive disorder, recurrent.  Anxiety.  Patient reported her  anxiety is increased due to pain in her left leg and not sure about the diagnosis.  She is taking hydrocodone, tramadol, gabapentin, Naprosyn but sometimes she feels it does not work.  She admitted it has caused impairment in her daily functioning and not able to go back to work.  We discussed medication adjustment however patient feels the current medicine working well and she does not want to change it.  I encouraged to keep appointment with her therapist [name] more frequently if needed.  I reviewed her current medication.  She also taking modafinil for  narcolepsy but lately not consistent as not working.  She has not taken Ativan in more than a year.  We will keep the current medication which is Wellbutrin SR 150 mg twice a day, Lamictal 150 mg daily and Lexapro 20 mg daily.  Recommended to call us back if she has any question or any concern.  Follow-up in 3 months.  Follow Up Instructions:    I discussed the assessment and treatment plan with the patient. The patient was provided an opportunity to ask questions and all were answered. The patient agreed with the plan and demonstrated an understanding of the instructions.   The patient was advised to call back or seek an in-person evaluation if the symptoms worsen or if the condition fails to improve as anticipated.  I provided 19 minutes of non-face-to-face time during this encounter.   Kathlee Nations, MD

## 2021-03-04 ENCOUNTER — Ambulatory Visit (HOSPITAL_COMMUNITY): Payer: Medicare PPO | Admitting: Licensed Clinical Social Worker

## 2021-03-04 ENCOUNTER — Other Ambulatory Visit: Payer: Self-pay

## 2021-03-11 ENCOUNTER — Other Ambulatory Visit: Payer: Self-pay | Admitting: Orthopaedic Surgery

## 2021-03-11 DIAGNOSIS — M79662 Pain in left lower leg: Secondary | ICD-10-CM

## 2021-03-19 ENCOUNTER — Ambulatory Visit
Admission: RE | Admit: 2021-03-19 | Discharge: 2021-03-19 | Disposition: A | Discharge status: Home / Self Care | Payer: Medicare PPO | Source: Ambulatory Visit | Attending: Orthopaedic Surgery | Admitting: Orthopaedic Surgery

## 2021-03-19 DIAGNOSIS — M79662 Pain in left lower leg: Secondary | ICD-10-CM

## 2021-03-23 ENCOUNTER — Other Ambulatory Visit: Payer: Self-pay

## 2021-03-23 ENCOUNTER — Ambulatory Visit (INDEPENDENT_AMBULATORY_CARE_PROVIDER_SITE_OTHER): Payer: Medicare PPO | Admitting: Licensed Clinical Social Worker

## 2021-03-23 ENCOUNTER — Encounter (HOSPITAL_COMMUNITY): Payer: Self-pay | Admitting: Licensed Clinical Social Worker

## 2021-03-23 DIAGNOSIS — F331 Major depressive disorder, recurrent, moderate: Secondary | ICD-10-CM | POA: Diagnosis not present

## 2021-03-23 NOTE — Progress Notes (Signed)
Virtual Visit via Video Note  I connected with Alice Bowers on 03/23/21 at  3:30 PM EDT by a video enabled telemedicine application and verified that I am speaking with the correct person using two identifiers.  Location: Patient: home Provider: office   I discussed the limitations of evaluation and management by telemedicine and the availability of in person appointments. The patient expressed understanding and agreed to proceed.  Type of Therapy: Individual Therapy   Treatment Goals addressed: "I need somebody to talk to about my problems so I won't get snappy with the other people in my life"   Interventions: MI   Summary: Alice Bowers is a 70 y.o. female who presents with MDD. mild   Suicidal/Homicidal: Nowithout intent/plan   Therapist Response: Alice Bowers met with clinician for an individual session. Alice Bowers discussed her psychiatric symptoms, her current life events and her goals for therapy. Alice Bowers shared that she continues to struggle with her leg, which was finally assessed as having a stress fracture in her tibia and inflammation/infection in the top layer of bone. Clinician processed the pain levels and how this has impacted her life over the past several weeks. Alice Bowers identified increased depression and hopelessness. Clinician explored work and noted that recently she has gone back to work, as she needs to make a living. However, she limps and still struggles to put much weight on her leg. Clinician reflected the positive outcome of being close with her neighbor, who has been feeding her, shopping for her, and visiting with her daily. Clinician processed coping skills and also noted the importance of grieving her little dog who had to be euthanized a couple of weeks ago.    Plan: Return again in 2-3 weeks.   Diagnosis: MDD, mild   I discussed the assessment and treatment plan with the patient. The patient was provided an opportunity to ask questions and all were answered.  The patient agreed with the plan and demonstrated an understanding of the instructions.   The patient was advised to call back or seek an in-person evaluation if the symptoms worsen or if the condition fails to improve as anticipated.  I provided 45 minutes of non-face-to-face time during this encounter.   [name], LCSW

## 2021-04-07 ENCOUNTER — Ambulatory Visit (HOSPITAL_COMMUNITY): Payer: Medicare PPO | Admitting: Licensed Clinical Social Worker

## 2021-04-07 ENCOUNTER — Other Ambulatory Visit: Payer: Self-pay

## 2021-04-28 ENCOUNTER — Other Ambulatory Visit: Payer: Self-pay

## 2021-04-28 ENCOUNTER — Ambulatory Visit (HOSPITAL_COMMUNITY): Payer: Medicare PPO | Admitting: Licensed Clinical Social Worker

## 2021-05-05 NOTE — Progress Notes (Incomplete)
Virtual Visit via Video Note  I connected with Alice Bowers on 05/05/21 at  8:00 AM EDT by a video enabled telemedicine application and verified that I am speaking with the correct person using two identifiers.  Location: Patient: home Provider: home   I discussed the limitations of evaluation and management by telemedicine and the availability of in person appointments. The patient expressed understanding and agreed to proceed.      I discussed the assessment and treatment plan with the patient. The patient was provided an opportunity to ask questions and all were answered. The patient agreed with the plan and demonstrated an understanding of the instructions.   The patient was advised to call back or seek an in-person evaluation if the symptoms worsen or if the condition fails to improve as anticipated.  I provided *** minutes of non-face-to-face time during this encounter.   [name], LCSW

## 2021-05-12 ENCOUNTER — Telehealth (HOSPITAL_COMMUNITY): Payer: Medicare PPO | Admitting: Psychiatry

## 2021-05-19 ENCOUNTER — Telehealth (HOSPITAL_BASED_OUTPATIENT_CLINIC_OR_DEPARTMENT_OTHER): Payer: Medicare PPO | Admitting: Psychiatry

## 2021-05-19 ENCOUNTER — Encounter (HOSPITAL_COMMUNITY): Payer: Self-pay | Admitting: Psychiatry

## 2021-05-19 ENCOUNTER — Other Ambulatory Visit: Payer: Self-pay

## 2021-05-19 DIAGNOSIS — F419 Anxiety disorder, unspecified: Secondary | ICD-10-CM | POA: Diagnosis not present

## 2021-05-19 DIAGNOSIS — F331 Major depressive disorder, recurrent, moderate: Secondary | ICD-10-CM

## 2021-05-19 MED ORDER — BUPROPION HCL ER (SR) 150 MG PO TB12: ORAL_TABLET | ORAL | 2 refills | Status: DC

## 2021-05-19 MED ORDER — LAMOTRIGINE 150 MG PO TABS: 150.0000 mg | ORAL_TABLET | Freq: Every day | ORAL | 2 refills | Status: DC

## 2021-05-19 MED ORDER — ESCITALOPRAM OXALATE 20 MG PO TABS: 20.0000 mg | ORAL_TABLET | Freq: Every day | ORAL | 2 refills | Status: DC

## 2021-05-19 NOTE — Progress Notes (Signed)
Virtual Visit via Telephone Note  I connected with Alice Bowers on 05/19/21 at 10:20 AM EDT by telephone and verified that I am speaking with the correct person using two identifiers.  Location: Patient: Work Provider: Biomedical scientist   I discussed the limitations, risks, security and privacy concerns of performing an evaluation and management service by telephone and the availability of in person appointments. I also discussed with the patient that there may be a patient responsible charge related to this service. The patient expressed understanding and agreed to proceed.   History of Present Illness: Patient is evaluated by phone session.  She is doing much better since her back pain is improved and she was relieved that it was a stress fracture than diastasis.  She is back to work 3 weeks ago and keeping herself busy.  She is more active and lost few pounds since the last visit.  She denies any crying spells or any feeling of hopelessness.  She is taking Wellbutrin and Lexapro and Lamictal that is helping her mood.  She denies any suicidal thoughts.  Her energy level is improved and she is in therapy with Alice Bowers.  She has no rash or itching tremors or shakes.  Past Psychiatric History:  H/O depression since 63 when going through divorce.  H/O 4 inpatient treatment. H/O suicidal attempt by taking overdose on Seroquel.  No h/o paranoia, psychosis or aggressive behavior.  Tried Paxil, Zoloft, Remeron (weight gain) and Seroquel with limited response.     Psychiatric Specialty Exam: Physical Exam  Review of Systems  Weight 253 lb 8.5 oz (115 kg).There is no height or weight on file to calculate BMI.  General Appearance: NA  Eye Contact:  NA  Speech:  Clear and Coherent  Volume:  Normal  Mood:  Euthymic  Affect:  NA  Thought Process:  Goal Directed  Orientation:  Full (Time, Place, and Person)  Thought Content:  WDL  Suicidal Thoughts:  No  Homicidal Thoughts:  No  Memory:   Immediate;   Good Recent;   Good Remote;   Good  Judgement:  Intact  Insight:  Present  Psychomotor Activity:  NA  Concentration:  Concentration: Good and Attention Span: Good  Recall:  Good  Fund of Knowledge:  Good  Language:  Good  Akathisia:  No  Handed:  Right  AIMS (if indicated):     Assets:  Communication Skills Desire for Improvement Housing Social Support Talents/Skills Transportation  ADL's:  Intact  Cognition:  WNL  Sleep:   better      Assessment and Plan: Major depressive disorder, recurrent.  Anxiety.  Patient doing much better since her leg pain is better and she is back to work.  She is not taking pain medication and enjoyed working which requires traveling.  Encouraged to continue therapy with Alice Bowers.  Continue Wellbutrin SR 150 mg twice a day and Lamictal 150 mg daily and Lexapro 20 mg daily.  Recommended to call us back if she has any question or any concern.  Follow-up in 3 months  Follow Up Instructions:    I discussed the assessment and treatment plan with the patient. The patient was provided an opportunity to ask questions and all were answered. The patient agreed with the plan and demonstrated an understanding of the instructions.   The patient was advised to call back or seek an in-person evaluation if the symptoms worsen or if the condition fails to improve as anticipated.  I provided 18 minutes of non-face-to-face  time during this encounter.   Kathlee Nations, MD

## 2021-05-20 ENCOUNTER — Other Ambulatory Visit: Payer: Self-pay

## 2021-05-20 ENCOUNTER — Ambulatory Visit (HOSPITAL_COMMUNITY): Payer: Medicare PPO | Admitting: Licensed Clinical Social Worker

## 2021-06-10 ENCOUNTER — Other Ambulatory Visit: Payer: Self-pay

## 2021-06-10 ENCOUNTER — Ambulatory Visit (HOSPITAL_COMMUNITY): Payer: Medicare PPO | Admitting: Licensed Clinical Social Worker

## 2021-06-21 ENCOUNTER — Other Ambulatory Visit (HOSPITAL_COMMUNITY): Payer: Self-pay | Admitting: Psychiatry

## 2021-06-21 DIAGNOSIS — F419 Anxiety disorder, unspecified: Secondary | ICD-10-CM

## 2021-06-21 DIAGNOSIS — F331 Major depressive disorder, recurrent, moderate: Secondary | ICD-10-CM

## 2021-07-07 ENCOUNTER — Other Ambulatory Visit: Payer: Self-pay

## 2021-07-07 ENCOUNTER — Ambulatory Visit (INDEPENDENT_AMBULATORY_CARE_PROVIDER_SITE_OTHER): Payer: Medicare PPO | Admitting: Licensed Clinical Social Worker

## 2021-07-07 ENCOUNTER — Encounter (HOSPITAL_COMMUNITY): Payer: Self-pay | Admitting: Licensed Clinical Social Worker

## 2021-07-07 DIAGNOSIS — F331 Major depressive disorder, recurrent, moderate: Secondary | ICD-10-CM | POA: Diagnosis not present

## 2021-07-07 NOTE — Progress Notes (Signed)
Virtual Visit via Video Note  I connected with Alice Bowers on 07/07/21 at 10:00 AM EST by a video enabled telemedicine application and verified that I am speaking with the correct person using two identifiers.  Location: Patient: son's home Provider: home office   I discussed the limitations of evaluation and management by telemedicine and the availability of in person appointments. The patient expressed understanding and agreed to proceed.  Type of Therapy: Individual Therapy   Treatment Goals addressed: "I need somebody to talk to about my problems so I won't get snappy with the other people in my life"   Interventions: MI   Summary: Alice Bowers is a 70 y.o. female who presents with MDD. mild   Suicidal/Homicidal: Nowithout intent/plan   Therapist Response: Alice Bowers met with clinician for an individual session. Alice Bowers discussed her psychiatric symptoms, her current life events and her goals for therapy. Alice Bowers shared that she is in [place] with her son for the first time in 5 years. Clinician utilized MI OARS to reflect and summarize joy in being with her family. Clinician explored travel experiences, noting several incidents of difficulty, confusion, and getting lost. However, she noted that it was worth it and now she can relax knowing she is better prepared for the ride home. Clinician discussed new job and noted that she has already quit. 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 Clinician identified the importance of acceptance of what she can and cannot do. Clinician also discussed the positive outcome that she was bale to go back to her previous job without a problem.     Plan: Return again in 2-3 weeks.   Diagnosis: MDD, mild   I discussed the assessment and treatment plan with the patient. The patient was provided an opportunity to ask questions and all were answered. The patient agreed with the plan and  demonstrated an understanding of the instructions.   The patient was advised to call back or seek an in-person evaluation if the symptoms worsen or if the condition fails to improve as anticipated.  I provided 45 minutes of non-face-to-face time during this encounter.   [name], LCSW

## 2021-07-28 ENCOUNTER — Other Ambulatory Visit: Payer: Self-pay

## 2021-07-28 ENCOUNTER — Ambulatory Visit (HOSPITAL_COMMUNITY): Payer: Medicare PPO | Admitting: Licensed Clinical Social Worker

## 2021-08-18 ENCOUNTER — Encounter (HOSPITAL_COMMUNITY): Payer: Self-pay | Admitting: Psychiatry

## 2021-08-18 ENCOUNTER — Telehealth (HOSPITAL_BASED_OUTPATIENT_CLINIC_OR_DEPARTMENT_OTHER): Payer: Medicare PPO | Admitting: Psychiatry

## 2021-08-18 ENCOUNTER — Other Ambulatory Visit: Payer: Self-pay

## 2021-08-18 DIAGNOSIS — F419 Anxiety disorder, unspecified: Secondary | ICD-10-CM | POA: Diagnosis not present

## 2021-08-18 DIAGNOSIS — F331 Major depressive disorder, recurrent, moderate: Secondary | ICD-10-CM

## 2021-08-18 MED ORDER — ESCITALOPRAM OXALATE 20 MG PO TABS: 20.0000 mg | ORAL_TABLET | Freq: Every day | ORAL | 2 refills | Status: DC

## 2021-08-18 MED ORDER — LAMOTRIGINE 150 MG PO TABS: 150.0000 mg | ORAL_TABLET | Freq: Every day | ORAL | 2 refills | Status: DC

## 2021-08-18 MED ORDER — BUPROPION HCL ER (SR) 150 MG PO TB12: ORAL_TABLET | ORAL | 2 refills | Status: DC

## 2021-08-18 NOTE — Progress Notes (Signed)
Virtual Visit via Telephone Note  I connected with Adrienne Trombetta on 08/18/21 at 10:20 AM EST by telephone and verified that I am speaking with the correct person using two identifiers.  Location: Patient: Home Provider: Home Office   I discussed the limitations, risks, security and privacy concerns of performing an evaluation and management service by telephone and the availability of in person appointments. I also discussed with the patient that there may be a patient responsible charge related to this service. The patient expressed understanding and agreed to proceed.   History of Present Illness: Patient is evaluated by phone session.  She is taking all her medication.  Patient reported Christmas was sad because her daughter died on Sep 11, 2004 years ago and she saw her last time on Christmas Day.  Patient is spent Christmas with her stepgranddaughter.  Patient overall feels things are going well.  She is relieved that her leg pain is improved.  Sometimes she sleeps during the day and not able to sleep at night.  She takes Nuvigil for excessive day time sleep.  She denies any crying spells or any feeling of hopelessness or worthlessness.  She likes her job as sometimes her schedule fluctuates.  She usually work 26-30 hours a week.  She has no rash, itching, tremors or shakes.  She is in therapy with [name].  Her appetite is okay and her weight is stable.  She denies any panic attack.   Past Psychiatric History:  H/O depression since 16 when going through divorce.  H/O 4 inpatient treatment. H/O suicidal attempt by taking overdose on Seroquel.  No h/o paranoia, psychosis or aggressive behavior.  Tried Paxil, Zoloft, Remeron (weight gain) and Seroquel with limited response.     Psychiatric Specialty Exam: Physical Exam  Review of Systems  Weight 253 lb (114.8 kg).There is no height or weight on file to calculate BMI.  General Appearance: NA  Eye Contact:  NA  Speech:  Clear and  Coherent and Normal Rate  Volume:  Normal  Mood:  Dysphoric  Affect:  NA  Thought Process:  Goal Directed  Orientation:  Full (Time, Place, and Person)  Thought Content:  WDL  Suicidal Thoughts:  No  Homicidal Thoughts:  No  Memory:  Immediate;   Good Recent;   Good Remote;   Good  Judgement:  Intact  Insight:  Present  Psychomotor Activity:  NA  Concentration:  Concentration: Good and Attention Span: Good  Recall:  Good  Fund of Knowledge:  Good  Language:  Good  Akathisia:  No  Handed:  Right  AIMS (if indicated):     Assets:  Communication Skills Desire for Improvement Housing Transportation  ADL's:  Intact  Cognition:  WNL  Sleep:   ok      Assessment and Plan: Major depressive disorder, recurrent.  Anxiety.  Patient is stable on her current medication.  She wants to continue the current dose as tolerating very well.  Encouraged to continue therapy with [name].  Continue Lamictal 150 mg daily, Wellbutrin SR 150 mg twice a day and Lexapro 20 mg daily.  Recommended to call us back if there is any question of any concern.  Follow-up in 3 months.  Follow Up Instructions:    I discussed the assessment and treatment plan with the patient. The patient was provided an opportunity to ask questions and all were answered. The patient agreed with the plan and demonstrated an understanding of the instructions.   The patient was advised  to call back or seek an in-person evaluation if the symptoms worsen or if the condition fails to improve as anticipated.  I provided 17 minutes of non-face-to-face time during this encounter.   Kathlee Nations, MD

## 2021-08-19 ENCOUNTER — Ambulatory Visit (HOSPITAL_COMMUNITY): Payer: Medicare PPO | Admitting: Licensed Clinical Social Worker

## 2021-09-08 ENCOUNTER — Ambulatory Visit (INDEPENDENT_AMBULATORY_CARE_PROVIDER_SITE_OTHER): Payer: Medicare PPO | Admitting: Licensed Clinical Social Worker

## 2021-09-08 ENCOUNTER — Other Ambulatory Visit: Payer: Self-pay

## 2021-09-08 DIAGNOSIS — F330 Major depressive disorder, recurrent, mild: Secondary | ICD-10-CM

## 2021-09-15 ENCOUNTER — Encounter (HOSPITAL_COMMUNITY): Payer: Self-pay | Admitting: Licensed Clinical Social Worker

## 2021-09-15 NOTE — Progress Notes (Signed)
Virtual Visit via Video Note  I connected with Alice Bowers on 09/15/21 at  2:30 PM EST by a video enabled telemedicine application and verified that I am speaking with the correct person using two identifiers.  Location: Patient: home Provider: home office   I discussed the limitations of evaluation and management by telemedicine and the availability of in person appointments. The patient expressed understanding and agreed to proceed.   Type of Therapy: Individual Therapy   Treatment Goals addressed: "I need somebody to talk to about my problems so I won't get snappy with the other people in my life"   Interventions: MI   Summary: Alice Bowers is a 72 y.o. female who presents with MDD. mild   Suicidal/Homicidal: Nowithout intent/plan   Therapist Response: [name] met with clinician for an individual session. Venezia discussed her psychiatric symptoms, her current life events and her goals for therapy. Nevia shared that she has been doing okay emotionally, but has been experiencing a lot of pain after an injury she sustained during her trip home from [place] with her son. Clinician processed the event and the current status of her pain levels. Clinician utilized MI OARS to process her thoughts and feelings about possible surgery. Clinician explored mood and interactions with others. Clinician also reviewed interactions with her family. Yarethzi identified improvement in relationships with her grandchildren, especially her granddaughter. Jasemine reports she has been closer to her since she and her father moved in with the other grandmother. Anhthu continues to work, but has been somewhat limited due to her injury.    Plan: Return again in 2-3 weeks.   Diagnosis: MDD, mild     I discussed the assessment and treatment plan with the patient. The patient was provided an opportunity to ask questions and all were answered. The patient agreed with the plan and demonstrated an understanding of  the instructions.   The patient was advised to call back or seek an in-person evaluation if the symptoms worsen or if the condition fails to improve as anticipated.  I provided 45 minutes of non-face-to-face time during this encounter.   [name], LCSW

## 2021-09-30 ENCOUNTER — Other Ambulatory Visit: Payer: Self-pay | Admitting: Orthopedic Surgery

## 2021-09-30 DIAGNOSIS — Z01811 Encounter for preprocedural respiratory examination: Secondary | ICD-10-CM

## 2021-09-30 NOTE — Progress Notes (Signed)
Surgery orders requested via Epic inbox. °

## 2021-10-05 ENCOUNTER — Other Ambulatory Visit (HOSPITAL_COMMUNITY): Payer: Self-pay | Admitting: Psychiatry

## 2021-10-05 DIAGNOSIS — F331 Major depressive disorder, recurrent, moderate: Secondary | ICD-10-CM

## 2021-10-05 NOTE — Progress Notes (Addendum)
Anesthesia Review:  PCP: April Giles, NP in [place]  That office closed and now seen by DR Humphrey Rolls [phone].  Called and requested  most recent office visit note.  Last seen 06/2021 per office.  They are to fax. Note given to [facility] on 10/08/21.   Cardiologist : seen by cardiologist in [place] , in 2016? 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  Blood pressure 180/96 at preop.    Pt has not taken meds this am and has been awake all nite delivering meds per pt .  PT has not eaten this am.  PT voices no complaints.  Reminded pt upon discharge to take meds as soon as possible this am.  PT voied understanding.   No covid test- ambulatory surgery.

## 2021-10-06 NOTE — Progress Notes (Addendum)
Alice Bowers  10/06/2021   Your procedure is scheduled on:     10/14/21.   Report to [facility] Main  Entrance   Report to admitting at  [place] AM     Call this number if you have problems the morning of surgery [phone]    Remember: Do not eat food , candy gum or mints :After Midnight the nite before surgery.   You may have clear liquids from midnight until __  0415am morning of surgery.  Please complete Ensure preop drink am of surgery by 0430am     CLEAR LIQUID DIET   Foods Allowed                                                                       Coffee and tea, regular and decaf             no milk, cream or creamer                  Plain Jell-O any favor except red or purple                                            Fruit ices (not with fruit pulp)                                      Iced Popsicles                                     Carbonated beverages, regular and diet                                    White cranberry , white grape or apple juices  Sports drinks like Gatorade Lightly seasoned clear broth or consume(fat free) Sugar   _____________________________________________________________________    BRUSH YOUR TEETH MORNING OF SURGERY AND RINSE YOUR MOUTH OUT, NO CHEWING GUM CANDY OR MINTS.     Take these medicines the morning of surgery with A SIP OF WATER:  inhalersa as usual and brin,g amlodipine, arimidex, wellbutrin, lexapro, gabapentin, nebuilizer if needed, lamictal   DO NOT TAKE ANY DIABETIC MEDICATIONS DAY OF YOUR SURGERY                               You may not have any metal on your body including hair pins and              piercings  Do not wear jewelry, make-up, lotions, powders or perfumes, deodorant             Do not wear nail polish on your fingernails.  Do not shave  48 hours prior to surgery.  Men may shave face and neck.   Do not bring valuables to the hospital. [place].  Contacts, dentures or bridgework may not be worn into surgery.  Leave suitcase in the car. After surgery it may be brought to your room.     Patients discharged the day of surgery will not be allowed to drive home. IF YOU ARE HAVING SURGERY AND GOING HOME THE SAME DAY, YOU MUST HAVE AN ADULT TO DRIVE YOU HOME AND BE WITH YOU FOR 24 HOURS. YOU MAY GO HOME BY TAXI OR UBER OR ORTHERWISE, BUT AN ADULT MUST ACCOMPANY YOU HOME AND STAY WITH YOU FOR 24 HOURS.  Name and phone number of your driver:  Special Instructions: N/A              Please read over the following fact sheets you were given: _____________________________________________________________________  [facility] - Preparing for Surgery Before surgery, you can play an important role.  Because skin is not sterile, your skin needs to be as free of germs as possible.  You can reduce the number of germs on your skin by washing with CHG (chlorahexidine gluconate) soap before surgery.  CHG is an antiseptic cleaner which kills germs and bonds with the skin to continue killing germs even after washing. Please DO NOT use if you have an allergy to CHG or antibacterial soaps.  If your skin becomes reddened/irritated stop using the CHG and inform your nurse when you arrive at Short Stay. Do not shave (including legs and underarms) for at least 48 hours prior to the first CHG shower.  You may shave your face/neck. Please follow these instructions carefully:  1.  Shower with CHG Soap the night before surgery and the  morning of Surgery.  2.  If you choose to wash your hair, wash your hair first as usual with your  normal  shampoo.  3.  After you shampoo, rinse your hair and body thoroughly to remove the  shampoo.                           4.  Use CHG as you would any other liquid soap.  You can apply chg directly  to the skin and wash                       Gently with a scrungie or clean washcloth.  5.  Apply the CHG  Soap to your body ONLY FROM THE NECK DOWN.   Do not use on face/ open                           Wound or open sores. Avoid contact with eyes, ears mouth and genitals (private parts).                       Wash face,  Genitals (private parts) with your normal soap.             6.  Wash thoroughly, paying special attention to the area where your surgery  will be performed.  7.  Thoroughly rinse your body with warm water from the neck down.  8.  DO NOT shower/wash with your normal soap after using and rinsing off  the CHG Soap.  9.  Pat yourself dry with a clean towel.            10.  Wear clean pajamas.            11.  Place clean sheets on your bed the night of your first shower and do not  sleep with pets. Day of Surgery : Do not apply any lotions/deodorants the morning of surgery.  Please wear clean clothes to the hospital/surgery center.  FAILURE TO FOLLOW THESE INSTRUCTIONS MAY RESULT IN THE CANCELLATION OF YOUR SURGERY PATIENT SIGNATURE_________________________________  NURSE SIGNATURE__________________________________  ________________________________________________________________________

## 2021-10-08 ENCOUNTER — Encounter (HOSPITAL_COMMUNITY)
Admission: RE | Admit: 2021-10-08 | Discharge: 2021-10-08 | Disposition: A | Discharge status: Home / Self Care | Payer: Medicare PPO | Source: Ambulatory Visit | Attending: Orthopedic Surgery | Admitting: Orthopedic Surgery

## 2021-10-08 ENCOUNTER — Ambulatory Visit (HOSPITAL_COMMUNITY)
Admission: RE | Admit: 2021-10-08 | Discharge: 2021-10-08 | Disposition: A | Discharge status: Home / Self Care | Payer: Medicare PPO | Source: Ambulatory Visit | Attending: Orthopedic Surgery | Admitting: Orthopedic Surgery

## 2021-10-08 ENCOUNTER — Other Ambulatory Visit: Payer: Self-pay

## 2021-10-08 ENCOUNTER — Encounter (HOSPITAL_COMMUNITY): Payer: Self-pay

## 2021-10-08 VITALS — BP 180/96 | HR 77 | Temp 97.7°F | Resp 18 | Ht 64.0 in | Wt 268.0 lb

## 2021-10-08 DIAGNOSIS — Z01818 Encounter for other preprocedural examination: Secondary | ICD-10-CM

## 2021-10-08 DIAGNOSIS — Z01811 Encounter for preprocedural respiratory examination: Secondary | ICD-10-CM | POA: Diagnosis present

## 2021-10-08 DIAGNOSIS — I10 Essential (primary) hypertension: Secondary | ICD-10-CM | POA: Insufficient documentation

## 2021-10-08 HISTORY — DX: Failed or difficult intubation, initial encounter: T88.4XXA

## 2021-10-08 HISTORY — DX: Personal history of urinary calculi: Z87.442

## 2021-10-08 HISTORY — DX: Ventricular tachycardia, unspecified: I47.20

## 2021-10-08 LAB — CBC
HCT: 41.3 % (ref 36.0–46.0)
Hemoglobin: 13.4 g/dL (ref 12.0–15.0)
MCH: 30.1 pg (ref 26.0–34.0)
MCHC: 32.4 g/dL (ref 30.0–36.0)
MCV: 92.8 fL (ref 80.0–100.0)
Platelets: 316 10*3/uL (ref 150–400)
RBC: 4.45 MIL/uL (ref 3.87–5.11)
RDW: 14.3 % (ref 11.5–15.5)
WBC: 8.8 10*3/uL (ref 4.0–10.5)
nRBC: 0 % (ref 0.0–0.2)

## 2021-10-08 LAB — BASIC METABOLIC PANEL
Anion gap: 10 (ref 5–15)
BUN: 16 mg/dL (ref 8–23)
CO2: 26 mmol/L (ref 22–32)
Calcium: 9.1 mg/dL (ref 8.9–10.3)
Chloride: 103 mmol/L (ref 98–111)
Creatinine, Ser: 0.67 mg/dL (ref 0.44–1.00)
GFR, Estimated: >60 mL/min (ref >60)
Glucose, Bld: 108 mg/dL — ABNORMAL HIGH (ref 70–99)
Potassium: 4.2 mmol/L (ref 3.5–5.1)
Sodium: 139 mmol/L (ref 135–145)

## 2021-10-08 LAB — SURGICAL PCR SCREEN
MRSA, PCR: NEGATIVE
Staphylococcus aureus: NEGATIVE

## 2021-10-08 NOTE — Progress Notes (Signed)
[facility]- Preparing for Total Shoulder Arthroplasty  °  °Before surgery, you can play an important role. Because skin is not sterile, your skin needs to be as free of germs as possible. You can reduce the number of germs on your skin by using the following products. °Benzoyl Peroxide Gel °Reduces the number of germs present on the skin °Applied twice a day to shoulder area starting two days before surgery   ° °================================================================== ° °Please follow these instructions carefully: ° °BENZOYL PEROXIDE 5% GEL ° °Please do not use if you have an allergy to benzoyl peroxide.   If your skin becomes reddened/irritated stop using the benzoyl peroxide. ° °Starting two days before surgery, apply as follows: °Apply benzoyl peroxide in the morning and at night. Apply after taking a shower. If you are not taking a shower clean entire shoulder front, back, and side along with the armpit with a clean wet washcloth. ° °Place a quarter-sized dollop on your shoulder and rub in thoroughly, making sure to cover the front, back, and side of your shoulder, along with the armpit.  ° °2 days before ____ AM   ____ PM              1 day before ____ AM   ____ PM °                        °Do this twice a day for two days.  (Last application is the night before surgery, AFTER using the CHG soap as described below). ° °Do NOT apply benzoyl peroxide gel on the day of surgery.  °

## 2021-10-12 NOTE — Anesthesia Preprocedure Evaluation (Addendum)
Anesthesia Evaluation  Patient identified by MRN, date of birth, ID band Patient awake    Reviewed: Allergy & Precautions, NPO status , Patient's Chart, lab work & pertinent test results  History of Anesthesia Complications (+) DIFFICULT AIRWAY and history of anesthetic complications  Airway Mallampati: III  TM Distance: <3 FB Neck ROM: Full    Dental no notable dental hx.    Pulmonary neg pulmonary ROS, asthma , sleep apnea and Continuous Positive Airway Pressure Ventilation , former smoker,    Pulmonary exam normal breath sounds clear to auscultation       Cardiovascular hypertension, Pt. on medications Normal cardiovascular exam+ dysrhythmias Ventricular Tachycardia + Valvular Problems/Murmurs  Rhythm:Regular Rate:Normal     Neuro/Psych PSYCHIATRIC DISORDERS Anxiety Depression Restless legs syndrome Hx/o macular degeneration negative neurological ROS  negative psych ROS   GI/Hepatic negative GI ROS, Neg liver ROS,   Endo/Other  Morbid obesityHyperlipidemia  Renal/GU negative Renal ROSHx/o renal calculi  negative genitourinary   Musculoskeletal negative musculoskeletal ROS (+) Arthritis , Right shoulder rotator cuff arthropathy   Abdominal   Peds negative pediatric ROS (+)  Hematology negative hematology ROS (+)   Anesthesia Other Findings   Reproductive/Obstetrics negative OB ROS                           Anesthesia Physical Anesthesia Plan  ASA: 3  Anesthesia Plan: General   Post-op Pain Management: Regional block*   Induction: Intravenous  PONV Risk Score and Plan: 4 or greater and Treatment may vary due to age or medical condition, Ondansetron, Dexamethasone and Midazolam  Airway Management Planned: Oral ETT and Video Laryngoscope Planned  Additional Equipment:   Intra-op Plan:   Post-operative Plan: Extubation in OR  Informed Consent: I have reviewed the patients  History and Physical, chart, labs and discussed the procedure including the risks, benefits and alternatives for the proposed anesthesia with the patient or authorized representative who has indicated his/her understanding and acceptance.     Dental advisory given  Plan Discussed with: CRNA  Anesthesia Plan Comments: (See PAT note 10/08/2021, Konrad Felix Ward, PA-C)      Anesthesia Quick Evaluation

## 2021-10-12 NOTE — Progress Notes (Signed)
Anesthesia Chart Review   Case: [id number] Date/Time: 10/14/21 0715   Procedure: REVERSE SHOULDER ARTHROPLASTY (Right: Shoulder)   Anesthesia type: Choice   Pre-op diagnosis: RIGHT SHOULDER ROTATOR CUFF TEAR / ARTHROPATHY   Location: [facility] ROOM 07 / [facility]   Surgeons: Tania Ade, MD       DISCUSSION:71 y.o. former smoker with h/o OSA on cpap, HTN, right shoulder rotator cuff tear scheduled for above procedure 10/14/21 with Dr. Tania Ade.   Pt reports difficult intubation in the past.  Available anesthesia notes reviewed with no anesthesia complications noted.    04/15/2019 ASA 3, general anesthesia, no anesthesia complications noted.  06/05/2018 ASA 3, general anesthesia. Per note, "Airway becomes severely compromised if anesthetic deepened. Converted to LMA" 05/15/2018 ASA 3, general anesthesia with LMA, no complications noted.   Anticipate pt can proceed with planned procedure barring acute status change.   VS: BP (!) 180/96    Pulse 77    Temp 36.5 C (Oral)    Resp 18    Ht 5\' 4"  (1.626 m)    Wt 121.6 kg    SpO2 98%    BMI 46.00 kg/m   PROVIDERS: Pcp, No   LABS: Labs reviewed: Acceptable for surgery. (all labs ordered are listed, but only abnormal results are displayed)  Labs Reviewed  BASIC METABOLIC PANEL - Abnormal; Notable for the following components:      Result Value   Glucose, Bld 108 (*)    All other components within normal limits  SURGICAL PCR SCREEN  CBC  TYPE AND SCREEN     IMAGES:   EKG: 10/08/2021 Rate 73 bpm  Normal sinus rhythm RSR' or QR pattern in V1 suggests right ventricular conduction delay Normal ECG When compared with ECG of 25-Jan-2020 12:35, No significant change was found  CV:  Past Medical History:  Diagnosis Date   Anxiety    Arthritis    Cancer ([place])    R breast cancer   Depression    Difficult intubation    Heart murmur    insignificant per MD per pt   High blood pressure    High cholesterol    History of kidney  stones    Macular degeneration of left eye    Macular degeneration of right eye    Obstructive sleep apnea    has cpap   Restless leg syndrome    V tach    hx of with sleep study laying on back per pt - 2016    Past Surgical History:  Procedure Laterality Date   ABDOMINAL HYSTERECTOMY     APPENDECTOMY     arthroscopy left shoulder     CHOLECYSTECTOMY     joint fusion bilateral thumbs     MASTECTOMY Bilateral 2019   ORIF right ankle     REPLACEMENT TOTAL KNEE BILATERAL      MEDICATIONS:  Albuterol Sulfate 108 (90 Base) MCG/ACT AEPB   amLODipine (NORVASC) 2.5 MG tablet   anastrozole (ARIMIDEX) 1 MG tablet   atorvastatin (LIPITOR) 40 MG tablet   buPROPion (WELLBUTRIN SR) 150 MG 12 hr tablet   calcium-vitamin D 250-100 MG-UNIT tablet   escitalopram (LEXAPRO) 20 MG tablet   fluticasone (FLONASE) 50 MCG/ACT nasal spray   gabapentin (NEURONTIN) 300 MG capsule   hydrochlorothiazide (HYDRODIURIL) 25 MG tablet   ipratropium-albuterol (DUONEB) 0.5-2.5 (3) MG/3ML SOLN   lamoTRIgine (LAMICTAL) 150 MG tablet   losartan (COZAAR) 100 MG tablet   modafinil (PROVIGIL) 200 MG tablet   montelukast (  SINGULAIR) 10 MG tablet   Multiple Vitamin (MULTIVITAMIN WITH MINERALS) TABS tablet   naproxen sodium (ALEVE) 220 MG tablet   pramipexole (MIRAPEX) 0.5 MG tablet   TRELEGY ELLIPTA 200-62.5-25 MCG/ACT AEPB   No current facility-administered medications for this encounter.    Konrad Felix Ward, PA-C [facility] [phone]

## 2021-10-13 ENCOUNTER — Encounter (HOSPITAL_COMMUNITY): Payer: Self-pay | Admitting: Orthopedic Surgery

## 2021-10-14 ENCOUNTER — Inpatient Hospital Stay (HOSPITAL_COMMUNITY): Payer: Medicare PPO

## 2021-10-14 ENCOUNTER — Inpatient Hospital Stay (HOSPITAL_COMMUNITY)
Admission: RE | Admit: 2021-10-14 | Discharge: 2021-10-14 | DRG: 483 | Disposition: A | Discharge status: Home / Self Care | Payer: Medicare PPO | Attending: Orthopedic Surgery | Admitting: Orthopedic Surgery

## 2021-10-14 ENCOUNTER — Encounter (HOSPITAL_COMMUNITY)
Admission: RE | Disposition: A | Discharge status: Home / Self Care | Payer: Self-pay | Source: Home / Self Care | Attending: Orthopedic Surgery

## 2021-10-14 ENCOUNTER — Encounter (HOSPITAL_COMMUNITY): Payer: Self-pay | Admitting: Orthopedic Surgery

## 2021-10-14 ENCOUNTER — Inpatient Hospital Stay (HOSPITAL_COMMUNITY): Payer: Medicare PPO | Admitting: Anesthesiology

## 2021-10-14 ENCOUNTER — Other Ambulatory Visit: Payer: Self-pay

## 2021-10-14 ENCOUNTER — Inpatient Hospital Stay (HOSPITAL_COMMUNITY): Payer: Medicare PPO | Admitting: Physician Assistant

## 2021-10-14 DIAGNOSIS — G2581 Restless legs syndrome: Secondary | ICD-10-CM | POA: Diagnosis present

## 2021-10-14 DIAGNOSIS — Z90710 Acquired absence of both cervix and uterus: Secondary | ICD-10-CM | POA: Diagnosis not present

## 2021-10-14 DIAGNOSIS — M75101 Unspecified rotator cuff tear or rupture of right shoulder, not specified as traumatic: Secondary | ICD-10-CM

## 2021-10-14 DIAGNOSIS — I10 Essential (primary) hypertension: Secondary | ICD-10-CM | POA: Diagnosis not present

## 2021-10-14 DIAGNOSIS — Z9049 Acquired absence of other specified parts of digestive tract: Secondary | ICD-10-CM | POA: Diagnosis not present

## 2021-10-14 DIAGNOSIS — Z79899 Other long term (current) drug therapy: Secondary | ICD-10-CM

## 2021-10-14 DIAGNOSIS — G4733 Obstructive sleep apnea (adult) (pediatric): Secondary | ICD-10-CM | POA: Diagnosis present

## 2021-10-14 DIAGNOSIS — H3530 Unspecified macular degeneration: Secondary | ICD-10-CM | POA: Diagnosis present

## 2021-10-14 DIAGNOSIS — M12811 Other specific arthropathies, not elsewhere classified, right shoulder: Secondary | ICD-10-CM

## 2021-10-14 DIAGNOSIS — M25711 Osteophyte, right shoulder: Secondary | ICD-10-CM | POA: Diagnosis present

## 2021-10-14 DIAGNOSIS — M19011 Primary osteoarthritis, right shoulder: Secondary | ICD-10-CM | POA: Diagnosis present

## 2021-10-14 DIAGNOSIS — Z87891 Personal history of nicotine dependence: Secondary | ICD-10-CM

## 2021-10-14 DIAGNOSIS — E7800 Pure hypercholesterolemia, unspecified: Secondary | ICD-10-CM | POA: Diagnosis present

## 2021-10-14 DIAGNOSIS — Z888 Allergy status to other drugs, medicaments and biological substances status: Secondary | ICD-10-CM | POA: Diagnosis not present

## 2021-10-14 DIAGNOSIS — F419 Anxiety disorder, unspecified: Secondary | ICD-10-CM | POA: Diagnosis present

## 2021-10-14 DIAGNOSIS — Z853 Personal history of malignant neoplasm of breast: Secondary | ICD-10-CM

## 2021-10-14 DIAGNOSIS — Z881 Allergy status to other antibiotic agents status: Secondary | ICD-10-CM

## 2021-10-14 DIAGNOSIS — Z96653 Presence of artificial knee joint, bilateral: Secondary | ICD-10-CM | POA: Diagnosis present

## 2021-10-14 DIAGNOSIS — Z96611 Presence of right artificial shoulder joint: Principal | ICD-10-CM

## 2021-10-14 DIAGNOSIS — Z882 Allergy status to sulfonamides status: Secondary | ICD-10-CM | POA: Diagnosis not present

## 2021-10-14 DIAGNOSIS — Z885 Allergy status to narcotic agent status: Secondary | ICD-10-CM

## 2021-10-14 DIAGNOSIS — E785 Hyperlipidemia, unspecified: Secondary | ICD-10-CM | POA: Diagnosis not present

## 2021-10-14 DIAGNOSIS — Z9013 Acquired absence of bilateral breasts and nipples: Secondary | ICD-10-CM

## 2021-10-14 HISTORY — PX: REVERSE SHOULDER ARTHROPLASTY: SHX5054

## 2021-10-14 LAB — TYPE AND SCREEN
ABO/RH(D): B POS
Antibody Screen: NEGATIVE

## 2021-10-14 LAB — ABO/RH: ABO/RH(D): B POS

## 2021-10-14 SURGERY — ARTHROPLASTY, SHOULDER, TOTAL, REVERSE
Anesthesia: General | Site: Shoulder | Laterality: Right

## 2021-10-14 MED ORDER — METHOCARBAMOL 500 MG PO TABS: 500.0000 mg | ORAL_TABLET | Freq: Four times a day (QID) | ORAL | Status: DC | PRN

## 2021-10-14 MED ORDER — MIDAZOLAM HCL 2 MG/2ML IJ SOLN
INTRAMUSCULAR | Status: AC
  Filled 2021-10-14: qty 2

## 2021-10-14 MED ORDER — BUPIVACAINE HCL (PF) 0.5 % IJ SOLN
INTRAMUSCULAR | Status: DC | PRN
  Administered 2021-10-14: 20 mL via PERINEURAL

## 2021-10-14 MED ORDER — ONDANSETRON HCL 4 MG/2ML IJ SOLN
INTRAMUSCULAR | Status: DC | PRN
Start: 2021-10-14 — End: 2021-10-14
  Administered 2021-10-14: 4 mg via INTRAVENOUS

## 2021-10-14 MED ORDER — CEFAZOLIN IN SODIUM CHLORIDE 3-0.9 GM/100ML-% IV SOLN
INTRAVENOUS | Status: AC
  Filled 2021-10-14: qty 100

## 2021-10-14 MED ORDER — FENTANYL CITRATE PF 50 MCG/ML IJ SOSY: 25.0000 ug | PREFILLED_SYRINGE | INTRAMUSCULAR | Status: DC | PRN

## 2021-10-14 MED ORDER — WATER FOR IRRIGATION, STERILE IR SOLN
Status: DC | PRN
Start: 2021-10-14 — End: 2021-10-14
  Administered 2021-10-14: 2000 mL

## 2021-10-14 MED ORDER — EPHEDRINE 5 MG/ML INJ
INTRAVENOUS | Status: AC
  Filled 2021-10-14: qty 5

## 2021-10-14 MED ORDER — ORAL CARE MOUTH RINSE: 15.0000 mL | Freq: Once | OROMUCOSAL | Status: AC

## 2021-10-14 MED ORDER — PHENYLEPHRINE HCL-NACL 20-0.9 MG/250ML-% IV SOLN
INTRAVENOUS | Status: DC | PRN
  Administered 2021-10-14: 30 ug/min via INTRAVENOUS

## 2021-10-14 MED ORDER — DEXAMETHASONE SODIUM PHOSPHATE 10 MG/ML IJ SOLN
INTRAMUSCULAR | Status: AC
  Filled 2021-10-14: qty 1

## 2021-10-14 MED ORDER — DEXAMETHASONE SODIUM PHOSPHATE 10 MG/ML IJ SOLN
INTRAMUSCULAR | Status: DC | PRN
  Administered 2021-10-14: 10 mg via INTRAVENOUS

## 2021-10-14 MED ORDER — CHLORHEXIDINE GLUCONATE 0.12 % MT SOLN
15.0000 mL | Freq: Once | OROMUCOSAL | Status: AC
  Administered 2021-10-14: 15 mL via OROMUCOSAL

## 2021-10-14 MED ORDER — MIDAZOLAM HCL 5 MG/5ML IJ SOLN
INTRAMUSCULAR | Status: DC | PRN
  Administered 2021-10-14: 1 mg via INTRAVENOUS

## 2021-10-14 MED ORDER — ONDANSETRON HCL 4 MG/2ML IJ SOLN: 4.0000 mg | Freq: Once | INTRAMUSCULAR | Status: DC | PRN

## 2021-10-14 MED ORDER — PROPOFOL 10 MG/ML IV BOLUS
INTRAVENOUS | Status: AC
  Filled 2021-10-14: qty 20

## 2021-10-14 MED ORDER — TIZANIDINE HCL 2 MG PO TABS: 2.0000 mg | ORAL_TABLET | Freq: Three times a day (TID) | ORAL | 0 refills | Status: DC | PRN

## 2021-10-14 MED ORDER — TRANEXAMIC ACID-NACL 1000-0.7 MG/100ML-% IV SOLN
1000.0000 mg | INTRAVENOUS | Status: AC
  Administered 2021-10-14: 1000 mg via INTRAVENOUS
  Filled 2021-10-14: qty 100

## 2021-10-14 MED ORDER — FENTANYL CITRATE (PF) 100 MCG/2ML IJ SOLN
INTRAMUSCULAR | Status: AC
  Filled 2021-10-14: qty 2

## 2021-10-14 MED ORDER — LIDOCAINE 2% (20 MG/ML) 5 ML SYRINGE
INTRAMUSCULAR | Status: DC | PRN
  Administered 2021-10-14: 60 mg via INTRAVENOUS

## 2021-10-14 MED ORDER — BUPIVACAINE LIPOSOME 1.3 % IJ SUSP
INTRAMUSCULAR | Status: DC | PRN
  Administered 2021-10-14: 10 mL via PERINEURAL

## 2021-10-14 MED ORDER — ROCURONIUM BROMIDE 10 MG/ML (PF) SYRINGE
PREFILLED_SYRINGE | INTRAVENOUS | Status: DC | PRN
  Administered 2021-10-14: 80 mg via INTRAVENOUS

## 2021-10-14 MED ORDER — OXYCODONE-ACETAMINOPHEN 5-325 MG PO TABS: 1.0000 | ORAL_TABLET | Freq: Four times a day (QID) | ORAL | 0 refills | Status: DC | PRN

## 2021-10-14 MED ORDER — METHOCARBAMOL 500 MG IVPB - SIMPLE MED: 500.0000 mg | Freq: Four times a day (QID) | INTRAVENOUS | Status: DC | PRN

## 2021-10-14 MED ORDER — FENTANYL CITRATE (PF) 100 MCG/2ML IJ SOLN
INTRAMUSCULAR | Status: DC | PRN
  Administered 2021-10-14: 50 ug via INTRAVENOUS

## 2021-10-14 MED ORDER — ONDANSETRON HCL 4 MG/2ML IJ SOLN
INTRAMUSCULAR | Status: AC
  Filled 2021-10-14: qty 2

## 2021-10-14 MED ORDER — LACTATED RINGERS IV SOLN: INTRAVENOUS | Status: DC

## 2021-10-14 MED ORDER — SUGAMMADEX SODIUM 200 MG/2ML IV SOLN
INTRAVENOUS | Status: DC | PRN
  Administered 2021-10-14: 200 mg via INTRAVENOUS

## 2021-10-14 MED ORDER — 0.9 % SODIUM CHLORIDE (POUR BTL) OPTIME
TOPICAL | Status: DC | PRN
Start: 2021-10-14 — End: 2021-10-14
  Administered 2021-10-14: 1000 mL

## 2021-10-14 MED ORDER — ALBUTEROL SULFATE HFA 108 (90 BASE) MCG/ACT IN AERS
INHALATION_SPRAY | RESPIRATORY_TRACT | Status: AC
  Filled 2021-10-14: qty 6.7

## 2021-10-14 MED ORDER — PROPOFOL 10 MG/ML IV BOLUS
INTRAVENOUS | Status: DC | PRN
  Administered 2021-10-14: 200 mg via INTRAVENOUS

## 2021-10-14 MED ORDER — VANCOMYCIN HCL 1500 MG/300ML IV SOLN
1500.0000 mg | INTRAVENOUS | Status: AC
  Administered 2021-10-14: 1500 mg via INTRAVENOUS
  Filled 2021-10-14: qty 300

## 2021-10-14 MED ORDER — PHENYLEPHRINE 40 MCG/ML (10ML) SYRINGE FOR IV PUSH (FOR BLOOD PRESSURE SUPPORT)
PREFILLED_SYRINGE | INTRAVENOUS | Status: DC | PRN
Start: 2021-10-14 — End: 2021-10-14
  Administered 2021-10-14: 80 ug via INTRAVENOUS

## 2021-10-14 MED ORDER — KETOROLAC TROMETHAMINE 30 MG/ML IJ SOLN: 30.0000 mg | Freq: Once | INTRAMUSCULAR | Status: DC | PRN

## 2021-10-14 MED ORDER — PHENYLEPHRINE 40 MCG/ML (10ML) SYRINGE FOR IV PUSH (FOR BLOOD PRESSURE SUPPORT)
PREFILLED_SYRINGE | INTRAVENOUS | Status: AC
  Filled 2021-10-14: qty 10

## 2021-10-14 MED ORDER — ALBUTEROL SULFATE HFA 108 (90 BASE) MCG/ACT IN AERS
INHALATION_SPRAY | RESPIRATORY_TRACT | Status: DC | PRN
  Administered 2021-10-14: 4 via RESPIRATORY_TRACT

## 2021-10-14 SURGICAL SUPPLY — 74 items
BAG COUNTER SPONGE SURGICOUNT (BAG) ×1 IMPLANT
BAG ZIPLOCK 12X15 (MISCELLANEOUS) ×2 IMPLANT
BASEPLATE P2 COATD GLND 6.5X30 (Shoulder) IMPLANT
BIT DRILL 1.6MX128 (BIT) ×1 IMPLANT
BIT DRILL 2.5 DIA 127 CALI (BIT) ×1 IMPLANT
BIT DRILL 4 DIA CALIBRATED (BIT) ×1 IMPLANT
BLADE SAW SAG 73X25 THK (BLADE) ×1
BLADE SAW SGTL 73X25 THK (BLADE) ×1 IMPLANT
BOOTIES KNEE HIGH SLOAN (MISCELLANEOUS) ×4 IMPLANT
COOLER ICEMAN CLASSIC (MISCELLANEOUS) ×1 IMPLANT
COVER BACK TABLE 60X90IN (DRAPES) ×2 IMPLANT
COVER SURGICAL LIGHT HANDLE (MISCELLANEOUS) ×2 IMPLANT
DRAPE INCISE IOBAN 66X45 STRL (DRAPES) ×2 IMPLANT
DRAPE ORTHO SPLIT 77X108 STRL (DRAPES) ×2
DRAPE POUCH INSTRU U-SHP 10X18 (DRAPES) ×2 IMPLANT
DRAPE SHEET LG 3/4 BI-LAMINATE (DRAPES) ×2 IMPLANT
DRAPE SURG 17X11 SM STRL (DRAPES) ×2 IMPLANT
DRAPE SURG ORHT 6 SPLT 77X108 (DRAPES) ×2 IMPLANT
DRAPE TOP 10253 STERILE (DRAPES) ×2 IMPLANT
DRAPE U-SHAPE 47X51 STRL (DRAPES) ×2 IMPLANT
DRSG AQUACEL AG ADV 3.5X 6 (GAUZE/BANDAGES/DRESSINGS) ×2 IMPLANT
DURAPREP 26ML APPLICATOR (WOUND CARE) ×4 IMPLANT
ELECT BLADE TIP CTD 4 INCH (ELECTRODE) ×2 IMPLANT
ELECT REM PT RETURN 15FT ADLT (MISCELLANEOUS) ×2 IMPLANT
GLOVE SRG 8 PF TXTR STRL LF DI (GLOVE) ×1 IMPLANT
GLOVE SURG ENC MOIS LTX SZ7.5 (GLOVE) ×2 IMPLANT
GLOVE SURG POLYISO LF SZ6.5 (GLOVE) ×2 IMPLANT
GLOVE SURG UNDER POLY LF SZ6.5 (GLOVE) ×2 IMPLANT
GLOVE SURG UNDER POLY LF SZ8 (GLOVE) ×1
GOWN STRL REUS W/TWL LRG LVL3 (GOWN DISPOSABLE) ×2 IMPLANT
GOWN STRL REUS W/TWL XL LVL3 (GOWN DISPOSABLE) ×2 IMPLANT
HANDPIECE INTERPULSE COAX TIP (DISPOSABLE) ×1
HOOD PEEL AWAY FLYTE STAYCOOL (MISCELLANEOUS) ×6 IMPLANT
INSERT SMALL SOCKET 32MM NEU (Insert) ×1 IMPLANT
KIT BASIN OR (CUSTOM PROCEDURE TRAY) ×2 IMPLANT
KIT TURNOVER KIT A (KITS) ×1 IMPLANT
MANIFOLD NEPTUNE II (INSTRUMENTS) ×2 IMPLANT
NDL TROCAR POINT SZ 2 1/2 (NEEDLE) IMPLANT
NEEDLE TROCAR POINT SZ 2 1/2 (NEEDLE) IMPLANT
NS IRRIG 1000ML POUR BTL (IV SOLUTION) ×2 IMPLANT
P2 COATDE GLNOID BSEPLT 6.5X30 (Shoulder) ×2 IMPLANT
PACK SHOULDER (CUSTOM PROCEDURE TRAY) ×2 IMPLANT
PAD COLD SHLDR WRAP-ON (PAD) ×1 IMPLANT
PROTECTOR NERVE ULNAR (MISCELLANEOUS) ×1 IMPLANT
RESTRAINT HEAD UNIVERSAL NS (MISCELLANEOUS) ×1 IMPLANT
RETRIEVER SUT HEWSON (MISCELLANEOUS) IMPLANT
SCREW BONE LOCKING RSP 5.0X14 (Screw) ×4 IMPLANT
SCREW BONE LOCKING RSP 5.0X30 (Screw) ×2 IMPLANT
SCREW BONE RSP LOCK 5X14 (Screw) IMPLANT
SCREW BONE RSP LOCK 5X26 (Screw) IMPLANT
SCREW BONE RSP LOCK 5X30 (Screw) IMPLANT
SCREW BONE RSP LOCKING 5.0X26 (Screw) ×2 IMPLANT
SCREW RETAIN W/HEAD 4MM OFFSET (Shoulder) ×1 IMPLANT
SET HNDPC FAN SPRY TIP SCT (DISPOSABLE) ×1 IMPLANT
SLING ARM IMMOBILIZER LRG (SOFTGOODS) ×1 IMPLANT
SLING ARM IMMOBILIZER MED (SOFTGOODS) IMPLANT
SPONGE T-LAP 18X18 ~~LOC~~+RFID (SPONGE) ×2 IMPLANT
SPONGE T-LAP 4X18 ~~LOC~~+RFID (SPONGE) ×2 IMPLANT
STEM HUMERAL 8X48 SHOULDER (Miscellaneous) ×1 IMPLANT
STRIP CLOSURE SKIN 1/2X4 (GAUZE/BANDAGES/DRESSINGS) ×4 IMPLANT
SUCTION FRAZIER HANDLE 10FR (MISCELLANEOUS)
SUCTION TUBE FRAZIER 10FR DISP (MISCELLANEOUS) IMPLANT
SUPPORT WRAP ARM LG (MISCELLANEOUS) IMPLANT
SUT ETHIBOND 2 V 37 (SUTURE) IMPLANT
SUT FIBERWIRE #2 38 REV NDL BL (SUTURE)
SUT MNCRL AB 4-0 PS2 18 (SUTURE) ×2 IMPLANT
SUT VIC AB 2-0 CT1 27 (SUTURE) ×1
SUT VIC AB 2-0 CT1 TAPERPNT 27 (SUTURE) ×1 IMPLANT
SUTURE FIBERWR#2 38 REV NDL BL (SUTURE) IMPLANT
TAPE LABRALWHITE 1.5X36 (TAPE) IMPLANT
TAPE SUT LABRALTAP WHT/BLK (SUTURE) IMPLANT
TOWEL OR 17X26 10 PK STRL BLUE (TOWEL DISPOSABLE) ×2 IMPLANT
TOWEL OR NON WOVEN STRL DISP B (DISPOSABLE) ×2 IMPLANT
WATER STERILE IRR 1000ML POUR (IV SOLUTION) ×2 IMPLANT

## 2021-10-14 NOTE — Transfer of Care (Signed)
Immediate Anesthesia Transfer of Care Note ? ?Patient: Alice Bowers ? ?Procedure(s) Performed: Procedure(s): ?REVERSE SHOULDER ARTHROPLASTY (Right) ? ?Patient Location: PACU ? ?Anesthesia Type:General ? ?Level of Consciousness: Alert, Awake, Oriented ? ?Airway & Oxygen Therapy: Patient Spontanous Breathing ? ?Post-op Assessment: Report given to RN ? ?Post vital signs: Reviewed and stable ? ?Last Vitals:  ?Vitals:  ? 10/14/21 0557  ?BP: (!) 136/91  ?Pulse: 85  ?Resp: 19  ?Temp: 36.8 ?C  ?SpO2: 95%  ? ? ?Complications: No apparent anesthesia complications ? ?

## 2021-10-14 NOTE — H&P (Signed)
Alice Bowers is an 71 y.o. female.   ?Chief Complaint: R shoulder pain and dysfunction ?HPI: R shoulder RCT with arthropathy, failed conservative treatment. ? ?Past Medical History:  ?Diagnosis Date  ? Anxiety   ? Arthritis   ? Cancer [facility])   ? R breast cancer  ? Depression   ? Difficult intubation   ? Heart murmur   ? insignificant per MD per pt  ? High blood pressure   ? High cholesterol   ? History of kidney stones   ? Macular degeneration of left eye   ? Macular degeneration of right eye   ? Obstructive sleep apnea   ? has cpap  ? Restless leg syndrome   ? V tach   ? hx of with sleep study laying on back per pt - 2016  ? ? ?Past Surgical History:  ?Procedure Laterality Date  ? ABDOMINAL HYSTERECTOMY    ? APPENDECTOMY    ? arthroscopy left shoulder    ? CHOLECYSTECTOMY    ? joint fusion bilateral thumbs    ? MASTECTOMY Bilateral 2019  ? ORIF right ankle    ? REPLACEMENT TOTAL KNEE BILATERAL    ? ? ?Family History  ?Problem Relation Age of Onset  ? Depression Mother   ? Hyperlipidemia Mother   ? Hypertension Mother   ? Depression Brother   ? Hyperlipidemia Father   ? Hypertension Father   ? Asthma Father   ? ?Social History:  reports that she quit smoking about 4 weeks ago. Her smoking use included cigarettes. She has a 25.00 pack-year smoking history. She has never used smokeless tobacco. She reports current alcohol use. She reports that she does not use drugs. ? ?Allergies:  ?Allergies  ?Allergen Reactions  ? Azo [Phenazopyridine] Hives  ? Pyridium [Phenazopyridine Hcl] Hives  ? Requip [Ropinirole Hcl] Swelling  ? Betadine [Povidone Iodine] Other (See Comments)  ?  Burn rash  ? Diclofenac Anxiety and Other (See Comments)  ?  Worsens restless legs symptoms.  ? Demerol [Meperidine] Hives  ? Edta [Edetic Acid] Swelling  ?  Throat swelling and voice to change  ? Macrobid [Nitrofurantoin Macrocrystal] Other (See Comments)  ?  bronchial spasms  ? Ceclor [Cefaclor] Rash  ? Cephalosporins Rash  ? Dynacirc  [Isradipine] Rash  ? Keflex [Cephalexin] Rash  ? Sulfa Antibiotics Rash  ? Voltaren [Diclofenac Sodium] Anxiety  ? ? ?Medications Prior to Admission  ?Medication Sig Dispense Refill  ? Albuterol Sulfate 108 (90 Base) MCG/ACT AEPB Inhale 1-2 puffs into the lungs 4 (four) times daily as needed (Asthma/Wheezing/ Shortness of breath).    ? amLODipine (NORVASC) 2.5 MG tablet Take 2.5 mg by mouth daily.     ? anastrozole (ARIMIDEX) 1 MG tablet Take 1 mg by mouth daily.    ? atorvastatin (LIPITOR) 40 MG tablet Take 40 mg by mouth daily.    ? buPROPion (WELLBUTRIN SR) 150 MG 12 hr tablet TAKE ONE TABLET BY MOUTH EVERY MORNING AND AT BEDTIME 60 tablet 2  ? calcium-vitamin D 250-100 MG-UNIT tablet Take 1 tablet by mouth daily.    ? escitalopram (LEXAPRO) 20 MG tablet Take 1 tablet (20 mg total) by mouth daily. 30 tablet 2  ? fluticasone (FLONASE) 50 MCG/ACT nasal spray Place 1 spray into both nostrils daily as needed for allergies or rhinitis.    ? gabapentin (NEURONTIN) 300 MG capsule Take 300 mg by mouth 3 (three) times daily.     ? hydrochlorothiazide (HYDRODIURIL) 25 MG tablet Take  25 mg by mouth daily.    ? ipratropium-albuterol (DUONEB) 0.5-2.5 (3) MG/3ML SOLN Take 3 mLs by nebulization every 6 (six) hours as needed (asthma).    ? lamoTRIgine (LAMICTAL) 150 MG tablet Take 1 tablet (150 mg total) by mouth daily. 30 tablet 2  ? losartan (COZAAR) 100 MG tablet Take 100 mg by mouth daily.    ? modafinil (PROVIGIL) 200 MG tablet Take 200 mg by mouth every morning.    ? montelukast (SINGULAIR) 10 MG tablet Take 10 mg by mouth daily.    ? Multiple Vitamin (MULTIVITAMIN WITH MINERALS) TABS tablet Take 1 tablet by mouth daily.    ? naproxen sodium (ALEVE) 220 MG tablet Take 440 mg by mouth 2 (two) times daily as needed (pain).    ? pramipexole (MIRAPEX) 0.5 MG tablet Take 0.5 mg by mouth in the morning and at bedtime.    ? TRELEGY ELLIPTA 200-62.5-25 MCG/ACT AEPB Take 1 puff by mouth daily.    ? ? ?No results found for this or  any previous visit (from the past 48 hour(s)). ?No results found. ? ?Review of Systems  ?All other systems reviewed and are negative. ? ?Blood pressure (!) 136/91, pulse 85, temperature 98.2 ?F (36.8 ?C), temperature source Oral, resp. rate 19, height 5\' 4"  (1.626 m), weight 121.6 kg, SpO2 95 %. ?Physical Exam ?HENT:  ?   Head: Atraumatic.  ?Eyes:  ?   Extraocular Movements: Extraocular movements intact.  ?Cardiovascular:  ?   Pulses: Normal pulses.  ?Musculoskeletal:  ?   Comments: R shoulder pain with limited ROM. NVID  ?Psychiatric:     ?   Mood and Affect: Mood normal.  ?  ? ?Assessment/Plan ?R shoulder RCT with arthropathy, failed conservative treatment. ?Plan R reverse TSA ?Risks / benefits of surgery discussed ?Consent on chart  ?NPO for OR ?Preop antibiotics ? ? ?Rhae Hammock, MD ?10/14/2021, 7:19 AM ? ? ? ?

## 2021-10-14 NOTE — Evaluation (Signed)
Occupational Therapy Evaluation ?Patient Details ?Name: Alice Bowers ?MRN: [id number] ?DOB: 05-22-1951 ?Today's Date: 10/14/2021 ? ? ?History of Present Illness Patient is a 71 year old female s/p R reverse total shoulder replacement. PMH: B TKA, breast CA, anxiety  ? ?Clinical Impression ?  ?Patient is a 71 year old female s/p shoulder replacement without functional use of right dominant upper extremity secondary to effects of surgery and interscalene block and shoulder precautions. Therapist provided education and instruction to patient and neighbor in regards to exercises, precautions, positioning, donning upper extremity clothing and bathing while maintaining shoulder precautions, ice and edema management and donning/doffing sling. Patient and neighbor verbalized understanding and demonstrated as needed. Patient needed assistance to donn shirt, underwear, pants, socks and shoes and provided with instruction on compensatory strategies to perform ADLs. Patient to follow up with MD for further therapy needs.  ?  ?   ? ?Recommendations for follow up therapy are one component of a multi-disciplinary discharge planning process, led by the attending physician.  Recommendations may be updated based on patient status, additional functional criteria and insurance authorization.  ? ?Follow Up Recommendations ? Follow physician's recommendations for discharge plan and follow up therapies  ?  ?Assistance Recommended at Discharge Intermittent Supervision/Assistance  ?Patient can return home with the following A little help with bathing/dressing/bathroom;Assistance with cooking/housework;Help with stairs or ramp for entrance ? ?  ?   ?Equipment Recommendations ? None recommended by OT  ?  ?   ?Precautions / Restrictions Precautions ?Precautions: Shoulder ?Type of Shoulder Precautions: A/PROM shoulder NO, AROM elbow, wrist, hand ok ?Shoulder Interventions: Shoulder sling/immobilizer;Off for  dressing/bathing/exercises ?Precaution Booklet Issued: Yes (comment) ?Required Braces or Orthoses: Sling ?Restrictions ?Weight Bearing Restrictions: Yes ?RUE Weight Bearing: Non weight bearing  ? ?  ? ?   ?Balance Overall balance assessment: History of Falls ?  ?  ?  ?  ?  ?  ?  ?  ?  ?  ?  ?  ?  ?  ?  ?  ?  ?  ?   ? ?ADL either performed or assessed with clinical judgement  ? ?ADL Overall ADL's : Needs assistance/impaired ?Eating/Feeding: Independent;Sitting ?Eating/Feeding Details (indicate cue type and reason): eating grahm crackers ?Grooming: Set up;Sitting ?  ?Upper Body Bathing: Minimal assistance ?  ?Lower Body Bathing: Moderate assistance ?  ?Upper Body Dressing : Minimal assistance ?Upper Body Dressing Details (indicate cue type and reason): Assist to thread R UE, patient able to pull shirt overhead and through L UE ?Lower Body Dressing: Moderate assistance;Sitting/lateral leans;Sit to/from stand ?Lower Body Dressing Details (indicate cue type and reason): Patient needing assist with underwear, with increased time able to thread pants but needing assist to pull up over R hip ?Toilet Transfer: Independent ?  ?Toileting- Clothing Manipulation and Hygiene: Minimal assistance ?  ?  ?  ?Functional mobility during ADLs: Independent ?General ADL Comments: Patient and friend educated on shoulder precautions and how to maintain during self care tasks.  ? ? ? ? ?Pertinent Vitals/Pain Pain Assessment ?Pain Assessment: Faces ?Faces Pain Scale: Hurts a little bit ?Pain Location: R UE ?Pain Descriptors / Indicators: Heaviness, Numbness ?Pain Intervention(s): Monitored during session  ? ? ? ?Hand Dominance Right ?  ?Extremity/Trunk Assessment Upper Extremity Assessment ?Upper Extremity Assessment: RUE deficits/detail ?RUE Deficits / Details: + nerve block ?  ?Lower Extremity Assessment ?Lower Extremity Assessment: Overall WFL for tasks assessed ?  ?  ?  ?Communication Communication ?Communication: No difficulties ?   ?Cognition Arousal/Alertness: Awake/alert ?  Behavior During Therapy: [facility] for tasks assessed/performed ?Overall Cognitive Status: Within Functional Limits for tasks assessed ?  ?  ?  ?  ?  ?  ?  ?  ?  ?  ?  ?  ?  ?  ?  ?  ?  ?  ?  ?   ?Exercises Exercises: Shoulder ?  ?Shoulder Instructions Shoulder Instructions ?Donning/doffing shirt without moving shoulder: Minimal assistance;Patient able to independently direct caregiver;Caregiver independent with task ?Method for sponge bathing under operated UE: Minimal assistance;Caregiver independent with task;Patient able to independently direct caregiver ?Donning/doffing sling/immobilizer: Moderate assistance;Caregiver independent with task;Patient able to independently direct caregiver ?Correct positioning of sling/immobilizer: Independent;Caregiver independent with task;Patient able to independently direct caregiver ?ROM for elbow, wrist and digits of operated UE: Caregiver independent with task;Patient able to independently direct caregiver ?Sling wearing schedule (on at all times/off for ADL's): Caregiver independent with task;Patient able to independently direct caregiver ?Proper positioning of operated UE when showering: Caregiver independent with task;Patient able to independently direct caregiver ?Positioning of UE while sleeping: Caregiver independent with task;Patient able to independently direct caregiver  ? ? ?Home Living Family/patient expects to be discharged to:: Private residence ?Living Arrangements: Alone ?Available Help at Discharge: Friend(s);Neighbor ?Type of Home: House ?Home Access: Stairs to enter ?Entrance Stairs-Number of Steps: 3 ?Entrance Stairs-Rails: Left ?Home Layout: Two level;Able to live on main level with bedroom/bathroom ?  ?  ?Bathroom Shower/Tub: Tub/shower unit ?  ?Bathroom Toilet: Standard ?  ?  ?Home Equipment: Shower seat;Cane - single point ?  ?  ?  ? ?  ?Prior Functioning/Environment Prior Level of Function : Independent/Modified  Independent ?  ?  ?  ?  ?  ?  ?  ?  ?  ? ?  ?  ?OT Problem List: Pain;Impaired UE functional use;Obesity;Decreased knowledge of precautions ?  ?   ?   ?OT Goals(Current goals can be found in the care plan section) Acute Rehab OT Goals ?Patient Stated Goal: Home with neighbor to assist ?OT Goal Formulation: All assessment and education complete, DC therapy  ? ?AM-PAC OT "6 Clicks" Daily Activity     ?Outcome Measure Help from another person eating meals?: None ?Help from another person taking care of personal grooming?: A Little ?Help from another person toileting, which includes using toliet, bedpan, or urinal?: A Little ?Help from another person bathing (including washing, rinsing, drying)?: A Lot ?Help from another person to put on and taking off regular upper body clothing?: A Little ?Help from another person to put on and taking off regular lower body clothing?: A Lot ?6 Click Score: 17 ?  ?End of Session Equipment Utilized During Treatment: Other (comment) (sling) ?Nurse Communication: Other (comment) (OT complete) ? ?Activity Tolerance: Patient tolerated treatment well ?Patient left: in chair;with call bell/phone within reach;with family/visitor present ? ?OT Visit Diagnosis: Pain ?Pain - Right/Left: Right ?Pain - part of body: Shoulder  ?              ?Time: 6812-7517 ?OT Time Calculation (min): 26 min ?Charges:  OT General Charges ?$OT Visit: 1 Visit ?OT Evaluation ?$OT Eval Low Complexity: 1 Low ?OT Treatments ?$Self Care/Home Management : 8-22 mins ? ?Alice Bowers OT ?OT pager: [phone] ? ? ?Alice Bowers ?10/14/2021, 11:25 AM ?

## 2021-10-14 NOTE — Anesthesia Postprocedure Evaluation (Signed)
Anesthesia Post Note ? ?Patient: Katherine Tout ? ?Procedure(s) Performed: REVERSE SHOULDER ARTHROPLASTY (Right: Shoulder) ? ?  ? ?Patient location during evaluation: PACU ?Anesthesia Type: General ?Level of consciousness: awake and alert ?Pain management: pain level controlled ?Vital Signs Assessment: post-procedure vital signs reviewed and stable ?Respiratory status: spontaneous breathing, nonlabored ventilation, respiratory function stable and patient connected to nasal cannula oxygen ?Cardiovascular status: blood pressure returned to baseline and stable ?Postop Assessment: no apparent nausea or vomiting ?Anesthetic complications: no ? ? ?No notable events documented. ? ?Last Vitals:  ?Vitals:  ? 10/14/21 0954 10/14/21 1000  ?BP:  115/89  ?Pulse: 85   ?Resp: (!) 23   ?Temp:  36.6 ?C  ?SpO2: 94% 93%  ?  ?Last Pain:  ?Vitals:  ? 10/14/21 1000  ?TempSrc:   ?PainSc: 0-No pain  ? ? ?  ?  ?  ?  ?  ?  ? ?Meridith Romick S ? ? ? ? ?

## 2021-10-14 NOTE — Progress Notes (Signed)
Per Idelle Jo, PA, patient may be discharged home today (3/2).  Orders for admission placed due to insurance purposes, but patient may still by discharged when stable today per original order. ?

## 2021-10-14 NOTE — Anesthesia Procedure Notes (Signed)
Procedure Name: Intubation ?Date/Time: 10/14/2021 7:56 AM ?Performed by: Gerald Leitz, CRNA ?Pre-anesthesia Checklist: Patient identified, Patient being monitored, Timeout performed, Emergency Drugs available and Suction available ?Patient Re-evaluated:Patient Re-evaluated prior to induction ?Oxygen Delivery Method: Circle system utilized ?Preoxygenation: Pre-oxygenation with 100% oxygen ?Induction Type: IV induction ?Ventilation: Mask ventilation without difficulty ?Laryngoscope Size: Mac, 3 and Glidescope ?Grade View: Grade I ?Tube type: Oral ?Tube size: 7.0 mm ?Number of attempts: 1 ?Airway Equipment and Method: Stylet and Video-laryngoscopy ?Placement Confirmation: ETT inserted through vocal cords under direct vision, positive ETCO2 and breath sounds checked- equal and bilateral ?Secured at: 22 cm ?Tube secured with: Tape ?Dental Injury: Teeth and Oropharynx as per pre-operative assessment  ? ? ? ? ?

## 2021-10-14 NOTE — Discharge Instructions (Addendum)

## 2021-10-14 NOTE — Anesthesia Procedure Notes (Signed)
Anesthesia Procedure Image    

## 2021-10-14 NOTE — Anesthesia Procedure Notes (Signed)
Anesthesia Regional Block: Interscalene brachial plexus block  ? ?Pre-Anesthetic Checklist: , timeout performed,  Correct Patient, Correct Site, Correct Laterality,  Correct Procedure, Correct Position, site marked,  Risks and benefits discussed,  Surgical consent,  Pre-op evaluation,  At surgeon's request and post-op pain management ? ?Laterality: Right ? ?Prep: chloraprep     ?  ?Needles:  ?Injection technique: Single-shot ? ?Needle Type: Echogenic Needle   ? ? ?Needle Length: 9cm  ? ? ? ? ?Additional Needles: ? ? ?Procedures:,,,, ultrasound used (permanent image in chart),,    ?Narrative:  ?Start time: 10/14/2021 7:00 AM ?End time: 10/14/2021 7:08 AM ?Injection made incrementally with aspirations every 5 mL. ? ?Performed by: Personally  ?Anesthesiologist: Myrtie Soman, MD ? ?Additional Notes: ?Patient tolerated the procedure well without complications ? ? ? ?

## 2021-10-14 NOTE — Op Note (Signed)
Procedure(s): ?REVERSE SHOULDER ARTHROPLASTY Procedure Note ? ?Alice Bowers ?female ?71 y.o. ?10/14/2021 ? 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 ?Postoperative diagnosis: Same ? ?Procedure(s) and Anesthesia Type: ?   * REVERSE SHOULDER ARTHROPLASTY - Choice ? ? ?Indications:  71 y.o. 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 Pain and dysfunction interfered with quality of life and nonoperative treatment with activity modification, NSAIDS and injections failed. ?    ?Surgeon: Rhae Hammock  ? ?Assistants: Forensic psychologist was present and scrubbed throughout the procedure and was essential in positioning, retraction, exposure, and closure) ? ?Anesthesia: General endotracheal anesthesia with preoperative interscalene block given by the attending anesthesiologist ? ? ? ?Procedure Detail ? ?REVERSE SHOULDER ARTHROPLASTY ? ? ?Estimated Blood Loss:  200 mL ?        ?Drains: none ? ?Blood Given: none  ?        ?Specimens: none ?       ?Complications:  * No complications entered in OR log * ?        ?Disposition: PACU - hemodynamically stable. ?        ?Condition: stable ?   ? ? ?OPERATIVE FINDINGS:  ?A DJO Altivate pressfit reverse total shoulder arthroplasty was placed with a  ?size 8 stem, a 32-4 glenosphere, and a standard-mm poly insert. The base plate  ?fixation was excellent. ? ?PROCEDURE: The patient was identified in the preoperative holding area  ?where I personally marked the operative site after verifying site, side,  ?and procedure with the patient. An interscalene block given by  ?the attending anesthesiologist in the holding area and the patient was taken back to the operating room where all extremities were  ?carefully padded in position after general anesthesia was induced. She  ?was placed in a beach-chair position and the operative upper extremity was  ?prepped and draped in a standard sterile  fashion. An approximately 10-  ?cm incision was made from the tip of the coracoid process to the center  ?point of the humerus at the level of the axilla. Dissection was carried  ?down through subcutaneous tissues to the level of the cephalic vein  ?which was taken laterally with the deltoid. The pectoralis major was  ?retracted medially. The subdeltoid space was developed and the lateral  ?edge of the conjoined tendon was identified. The undersurface of  ?conjoined tendon was palpated and the musculocutaneous nerve was not in  ?the field. Retractor was placed underneath the conjoined and second  ?retractor was placed lateral into the deltoid. The circumflex humeral  ?artery and vessels were identified and clamped and coagulated. The  ?biceps tendon was absent.  The subscapularis was taken down as a peel with the underlying capsule.  The  ?joint was then gently externally rotated while the capsule was released  ?from the humeral neck around to just beyond the 6 o'clock position. At  ?this point, the joint was dislocated and the humeral head was presented  ?into the wound. The excessive osteophyte formation was removed with a  ?large rongeur.  The cutting guide was used to make the appropriate  ?head cut and the head was saved for potentially bone grafting.  The glenoid was exposed with the arm in an  ?abducted extended position. The anterior and posterior labrum were  ?completely excised and the capsule was released circumferentially to  ?allow for exposure of the glenoid for preparation. The 2.5 mm drill was  ?placed using  the guide in 5-10? inferior angulation and the tap was then advanced in the same hole. Small and large reamers were then used. The tap was then removed and the Metaglene was then screwed in with good purchase.  The peripheral guide was then used to drilled measured and filled peripheral locking screws. The size 32-4 glenosphere was then impacted on the [facility] taper and the central screw was  placed. The humerus was then again exposed and the diaphyseal reamers were used followed by the metaphyseal reamers. The final broach was left in place in the proximal trial was placed. The joint was reduced and with this implant it was felt that soft tissue tensioning was appropriate with excellent stability and excellent range of motion. Therefore, final humeral stem was placed press-fit.  And then the trial polyethylene inserts were tested again and the above implant was felt to be the most appropriate for final insertion. The joint was reduced taken through full range of motion and felt to be stable. Soft tissue tension was appropriate.  ?The joint was then copiously irrigated with pulse  ?lavage and the wound was then closed. The subscapularis was loosely repaired with 1 FiberWire.  Skin was closed with 2-0 Vicryl in a deep dermal layer and 4-0  ?Monocryl for skin closure. Steri-Strips were applied. Sterile  ?dressings were then applied as well as a sling. The patient was allowed  ?to awaken from general anesthesia, transferred to stretcher, and taken  ?to recovery room in stable condition.  ? ?POSTOPERATIVE PLAN: The patient will be observed in the recovery room.  If she has good pain control with her regional block and she is hemodynamically stable she could be discharged home today with family.  She is compliant with home CPAP. ?

## 2021-10-15 ENCOUNTER — Encounter (HOSPITAL_COMMUNITY): Payer: Self-pay | Admitting: Orthopedic Surgery

## 2021-10-27 IMAGING — DX DG CHEST 2V
2 series · 2 of 2 positions shown · non-contrast
Comparison: December 08, 2016

CLINICAL DATA: Shortness of breath with wheezing

EXAM:
CHEST - 2 VIEW

[chest pa]
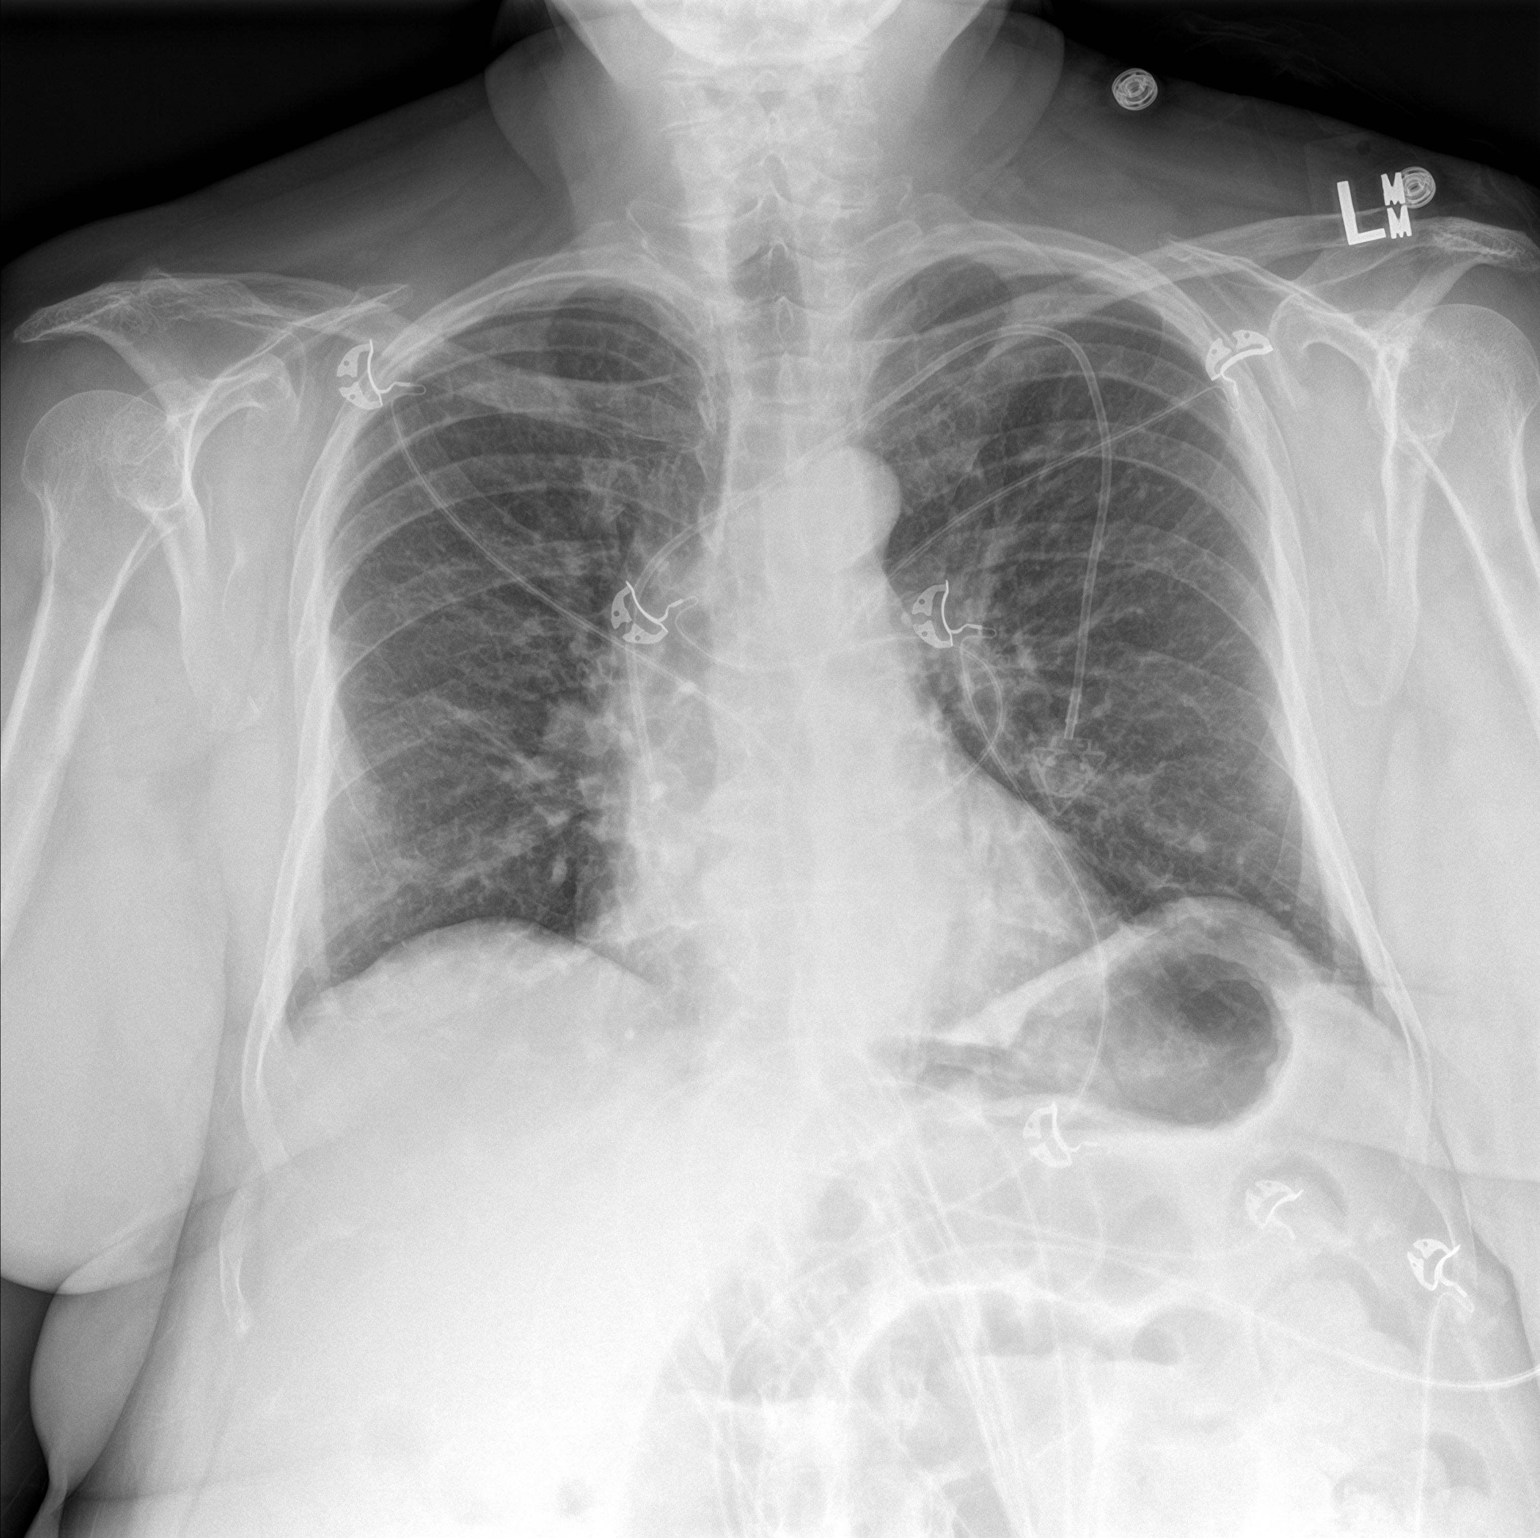

[chest lat]
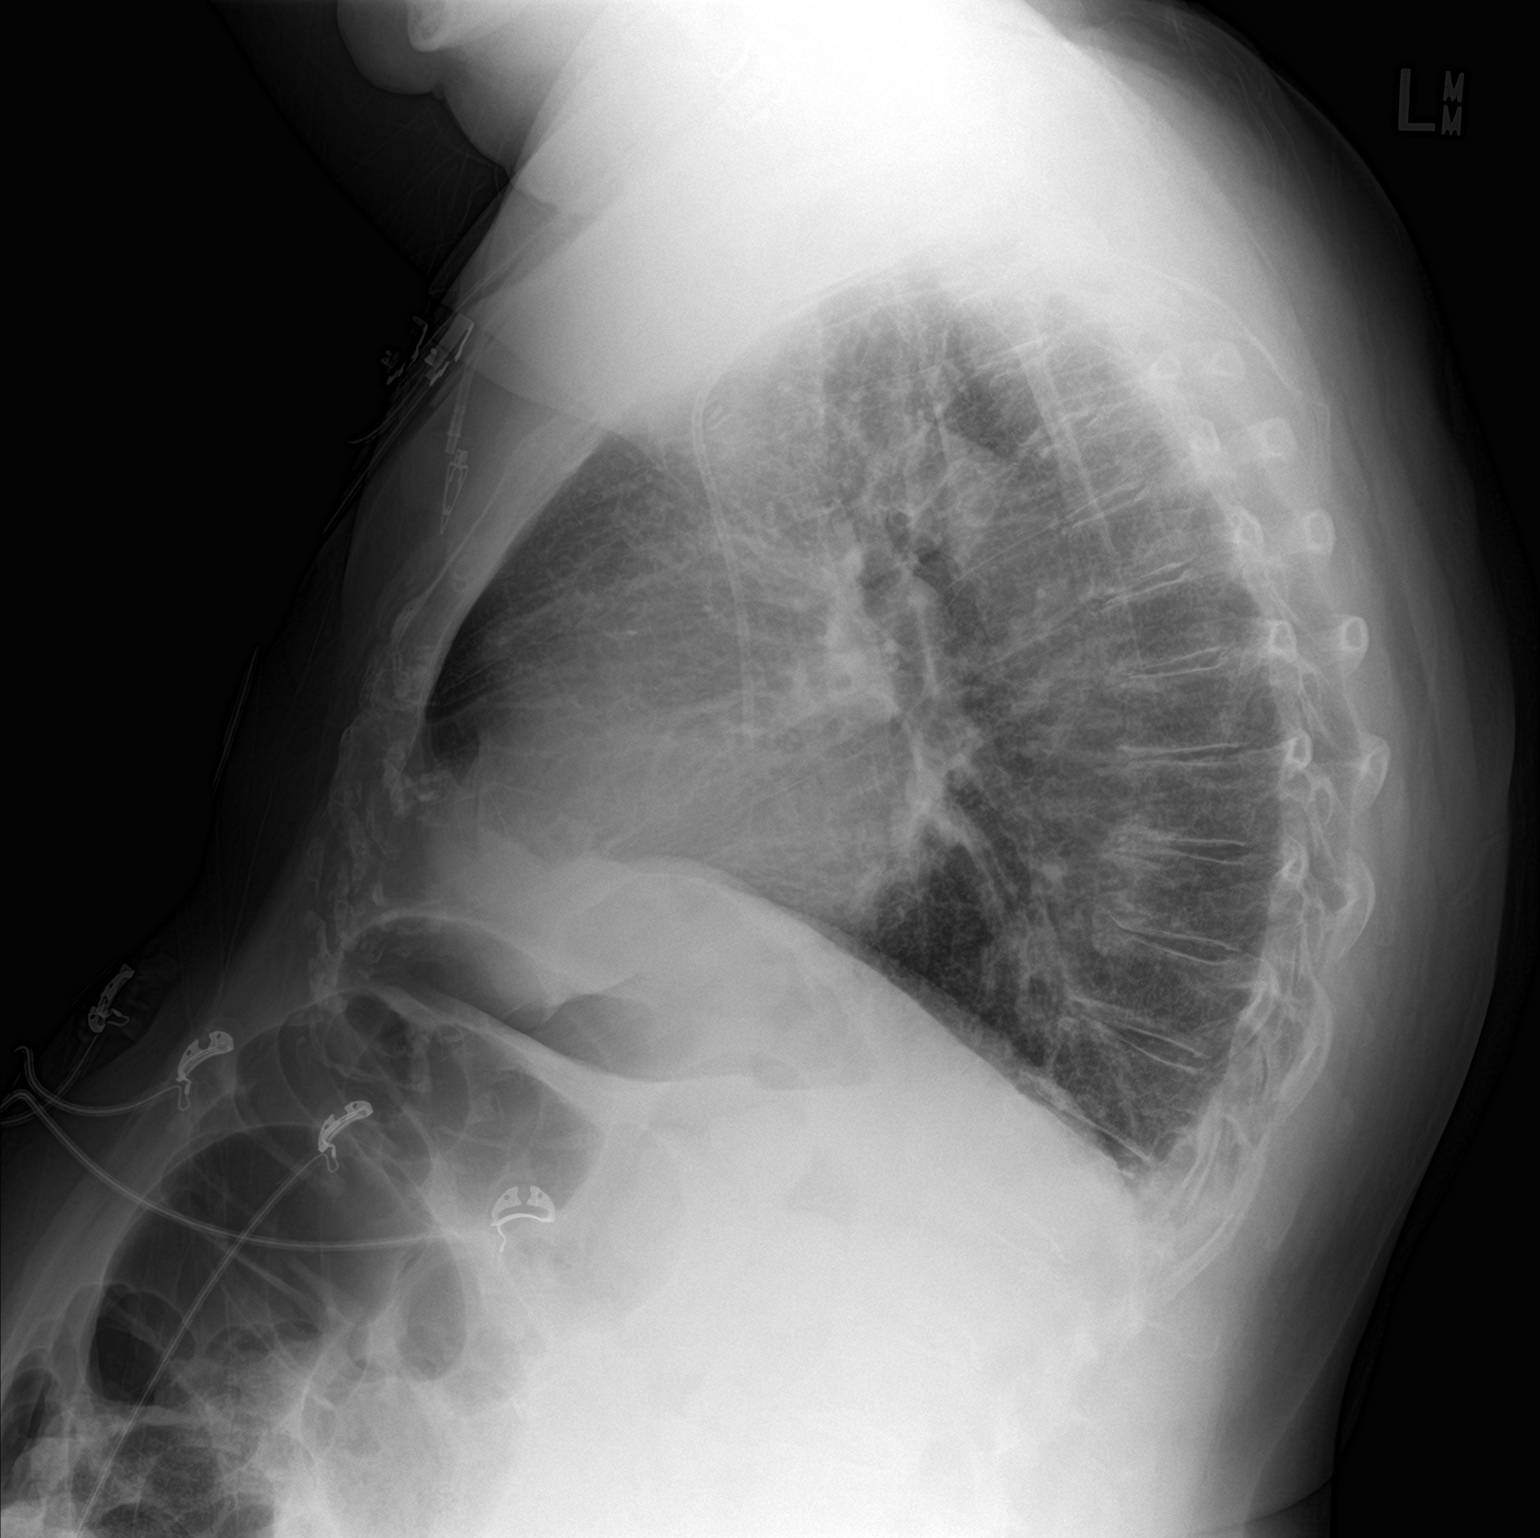

[2 of 2 positions shown; findings below may reference images not displayed]

FINDINGS: Port-A-Cath tip is in the superior vena cava. No pneumothorax. There
is slight atelectasis in the right mid lung. Lungs elsewhere are
clear. Heart size and pulmonary vascularity are normal. No
adenopathy. There is mild degenerative change in the thoracic spine.
IMPRESSION: Port-A-Cath tip in superior vena cava. No edema or airspace opacity.
Mild right midlung atelectasis. Cardiac silhouette normal.

## 2021-10-27 NOTE — Discharge Summary (Signed)
Patient ID: ?Alice Bowers ?MRN: [id number] ?DOB/AGE: 01/24/1951 71 y.o. ? ?Admit date: 10/14/2021 ?Discharge date: 10/14/2021 ? ?Admission Diagnoses:  ?Principal Problem: ?  S/P reverse total shoulder arthroplasty, right ? ? ?Discharge Diagnoses:  ?Same ? ?Past Medical History:  ?Diagnosis Date  ? Anxiety   ? Arthritis   ? Cancer [facility])   ? R breast cancer  ? Depression   ? Difficult intubation   ? Heart murmur   ? insignificant per MD per pt  ? High blood pressure   ? High cholesterol   ? History of kidney stones   ? Macular degeneration of left eye   ? Macular degeneration of right eye   ? Obstructive sleep apnea   ? has cpap  ? Restless leg syndrome   ? V tach   ? hx of with sleep study laying on back per pt - 2016  ? ? ?Surgeries: Procedure(s): ?REVERSE SHOULDER ARTHROPLASTY on 10/14/2021 ?  ?Consultants:  ? ?Discharged Condition: Improved ? ?Hospital Course: Alice Bowers is an 71 y.o. female who was admitted 10/14/2021 for operative treatment ofS/P reverse total shoulder arthroplasty, right. Patient has severe unremitting pain that affects sleep, daily activities, and work/hobbies. After pre-op clearance the patient was taken to the operating room on 10/14/2021 and underwent  Procedure(s): ?REVERSE SHOULDER ARTHROPLASTY.   ? ?Patient was given perioperative antibiotics:  ?Anti-infectives (From admission, onward)  ? ? Start     Dose/Rate Route Frequency Ordered Stop  ? 10/14/21 0723  ceFAZolin (ANCEF) 3-0.9 GM/100ML-% IVPB  Status:  Discontinued       ?Note to Pharmacy: Randa Evens D: cabinet override  ?    10/14/21 0723 10/14/21 1655  ? 10/14/21 0615  vancomycin (VANCOREADY) IVPB 1500 mg/300 mL       ? 1,500 mg ?150 mL/hr over 120 Minutes Intravenous On call to O.R. 10/14/21 0604 10/14/21 0927  ? ?  ?  ? ?Patient was given sequential compression devices, early ambulation, and chemoprophylaxis to prevent DVT. ? ?Patient benefited maximally from extended recovery in PACU and there were no complications.  She  was discharged home same day. ? ? ? ?Discharge Medications:   ?Allergies as of 10/14/2021   ? ?   Reactions  ? Azo [phenazopyridine] Hives  ? Pyridium [phenazopyridine Hcl] Hives  ? Requip [ropinirole Hcl] Swelling  ? Betadine [povidone Iodine] Other (See Comments)  ? Burn rash  ? Diclofenac Anxiety, Other (See Comments)  ? Worsens restless legs symptoms.  ? Demerol [meperidine] Hives  ? Edta [edetic Acid] Swelling  ? Throat swelling and voice to change  ? Macrobid [nitrofurantoin Macrocrystal] Other (See Comments)  ? bronchial spasms  ? Ceclor [cefaclor] Rash  ? Cephalosporins Rash  ? Dynacirc [isradipine] Rash  ? Keflex [cephalexin] Rash  ? Sulfa Antibiotics Rash  ? Voltaren [diclofenac Sodium] Anxiety  ? ?  ? ?  ?Medication List  ?  ? ?STOP taking these medications   ? ?naproxen sodium 220 MG tablet ?Commonly known as: ALEVE ?  ? ?  ? ?TAKE these medications   ? ?Albuterol Sulfate 108 (90 Base) MCG/ACT Aepb ?Commonly known as: PROAIR RESPICLICK ?Inhale 1-2 puffs into the lungs 4 (four) times daily as needed (Asthma/Wheezing/ Shortness of breath). ?  ?amLODipine 2.5 MG tablet ?Commonly known as: NORVASC ?Take 2.5 mg by mouth daily. ?  ?anastrozole 1 MG tablet ?Commonly known as: ARIMIDEX ?Take 1 mg by mouth daily. ?  ?atorvastatin 40 MG tablet ?Commonly known as: LIPITOR ?Take 40 mg by  mouth daily. ?  ?buPROPion 150 MG 12 hr tablet ?Commonly known as: WELLBUTRIN SR ?TAKE ONE TABLET BY MOUTH EVERY MORNING AND AT BEDTIME ?  ?calcium-vitamin D 250-100 MG-UNIT tablet ?Take 1 tablet by mouth daily. ?  ?escitalopram 20 MG tablet ?Commonly known as: LEXAPRO ?Take 1 tablet (20 mg total) by mouth daily. ?  ?fluticasone 50 MCG/ACT nasal spray ?Commonly known as: FLONASE ?Place 1 spray into both nostrils daily as needed for allergies or rhinitis. ?  ?gabapentin 300 MG capsule ?Commonly known as: NEURONTIN ?Take 300 mg by mouth 3 (three) times daily. ?  ?hydrochlorothiazide 25 MG tablet ?Commonly known as: HYDRODIURIL ?Take 25  mg by mouth daily. ?  ?ipratropium-albuterol 0.5-2.5 (3) MG/3ML Soln ?Commonly known as: DUONEB ?Take 3 mLs by nebulization every 6 (six) hours as needed (asthma). ?  ?lamoTRIgine 150 MG tablet ?Commonly known as: LAMICTAL ?Take 1 tablet (150 mg total) by mouth daily. ?  ?losartan 100 MG tablet ?Commonly known as: COZAAR ?Take 100 mg by mouth daily. ?  ?modafinil 200 MG tablet ?Commonly known as: PROVIGIL ?Take 200 mg by mouth every morning. ?  ?montelukast 10 MG tablet ?Commonly known as: SINGULAIR ?Take 10 mg by mouth daily. ?  ?multivitamin with minerals Tabs tablet ?Take 1 tablet by mouth daily. ?  ?oxyCODONE-acetaminophen 5-325 MG tablet ?Commonly known as: Percocet ?Take 1 tablet by mouth every 6 (six) hours as needed for severe pain. ?  ?pramipexole 0.5 MG tablet ?Commonly known as: MIRAPEX ?Take 0.5 mg by mouth in the morning and at bedtime. ?  ?tiZANidine 2 MG tablet ?Commonly known as: ZANAFLEX ?Take 1 tablet (2 mg total) by mouth every 8 (eight) hours as needed for muscle spasms. ?  ?Trelegy Ellipta 200-62.5-25 MCG/ACT Aepb ?Generic drug: Fluticasone-Umeclidin-Vilant ?Take 1 puff by mouth daily. ?  ? ?  ? ? ?Diagnostic Studies: DG Chest 2 View ? ?Result Date: 10/10/2021 ?CLINICAL DATA:  Medical clearance for orthopedic surgery.  Asthma EXAM: CHEST - 2 VIEW COMPARISON:  01/25/2020 FINDINGS: Lungs are well expanded, symmetric, and clear. No pneumothorax or pleural effusion. Cardiac size within normal limits. Pulmonary vascularity is normal. Osseous structures are age-appropriate. No acute bone abnormality. IMPRESSION: No active cardiopulmonary disease. Electronically Signed   By: Fidela Salisbury M.D.   On: 10/10/2021 23:45  ? ?DG Shoulder Right Port ? ?Result Date: 10/14/2021 ?CLINICAL DATA:  Right reverse shoulder arthroplasty EXAM: RIGHT SHOULDER - 1 VIEW COMPARISON:  None. FINDINGS: Interval right total reverse shoulder arthroplasty. No hardware failure or complication. Normal alignment. No acute fracture or  dislocation. Mild arthropathy of the acromioclavicular joint. IMPRESSION: 1. Interval right total reverse shoulder arthroplasty. Electronically Signed   By: Kathreen Devoid M.D.   On: 10/14/2021 09:51   ? ?Disposition: Discharge disposition: 01-Home or Self Care ? ? ? ? ? ? ? ? ? Follow-up Information   ? ? Tania Ade, MD. Schedule an appointment as soon as possible for a visit in 2 week(s).   ?Specialty: Orthopedic Surgery ?Contact information: ?[address] ?[address] ?[place] [address] ?[phone] ? ? ?  ?  ? ?  ?  ? ?  ? ? ? ?Signed: ?Cassie Shedlock L. [place], PA-C ?10/27/2021, 12:58 PM ? ?  ?

## 2021-11-02 ENCOUNTER — Ambulatory Visit (INDEPENDENT_AMBULATORY_CARE_PROVIDER_SITE_OTHER): Payer: Medicare PPO | Admitting: Licensed Clinical Social Worker

## 2021-11-02 ENCOUNTER — Encounter (HOSPITAL_COMMUNITY): Payer: Self-pay | Admitting: Licensed Clinical Social Worker

## 2021-11-02 ENCOUNTER — Other Ambulatory Visit: Payer: Self-pay

## 2021-11-02 DIAGNOSIS — F330 Major depressive disorder, recurrent, mild: Secondary | ICD-10-CM
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
## 2021-11-17 ENCOUNTER — Telehealth (HOSPITAL_COMMUNITY): Payer: Medicare PPO | Admitting: Psychiatry

## 2021-11-17 ENCOUNTER — Ambulatory Visit (HOSPITAL_COMMUNITY): Payer: Medicare PPO | Admitting: Licensed Clinical Social Worker

## 2021-11-17 ENCOUNTER — Telehealth (HOSPITAL_BASED_OUTPATIENT_CLINIC_OR_DEPARTMENT_OTHER): Payer: Medicare PPO | Admitting: Psychiatry

## 2021-11-17 ENCOUNTER — Encounter (HOSPITAL_COMMUNITY): Payer: Self-pay | Admitting: Psychiatry

## 2021-11-17 DIAGNOSIS — F331 Major depressive disorder, recurrent, moderate: Secondary | ICD-10-CM | POA: Diagnosis not present

## 2021-11-17 DIAGNOSIS — F419 Anxiety disorder, unspecified: Secondary | ICD-10-CM | POA: Diagnosis not present

## 2021-11-17 MED ORDER — BUPROPION HCL ER (SR) 150 MG PO TB12: ORAL_TABLET | ORAL | 2 refills | Status: DC

## 2021-11-17 MED ORDER — ESCITALOPRAM OXALATE 20 MG PO TABS: 20.0000 mg | ORAL_TABLET | Freq: Every day | ORAL | 2 refills | Status: DC

## 2021-11-17 MED ORDER — LAMOTRIGINE 150 MG PO TABS: 150.0000 mg | ORAL_TABLET | Freq: Every day | ORAL | 2 refills | Status: DC

## 2021-11-17 NOTE — Progress Notes (Signed)
Virtual Visit via Video Note ? ?I connected with Alice Bowers on 11/17/21 at  4:00 PM EDT by a video enabled telemedicine application and verified that I am speaking with the correct person using two identifiers. ? ?Location: ?Patient: Home ?Provider: Home office ?  ?I discussed the limitations of evaluation and management by telemedicine and the availability of in person appointments. The patient expressed understanding and agreed to proceed. ? ?History of Present Illness: ?Patient is evaluated by video session.  She recently had procedure for her shoulder and slowly and gradually she is getting better.  She started driving and working.  She reported her mood is stable however there are times when she gets irritated but denies any anger, mania, crying spells or any suicidal thoughts.  She is in therapy with Alice Bowers.  She sleeps good.  Her appetite is okay.  Her weight is stable.  She denies any major panic attack.  She is in contact with her granddaughter and able to see in person once or twice a month.  Her job is going well.  She still have a lot of driving but she is able to work.  She has no tremors, shakes or any EPS.  She like to keep the current medication. ?  ?Past Psychiatric History:  ?H/O depression since 28 when going through divorce.  H/O 4 inpatient treatment. H/O suicidal attempt by taking overdose on Seroquel.  No h/o paranoia, psychosis or aggressive behavior.  Tried Paxil, Zoloft, Remeron (weight gain) and Seroquel with limited response.   ? ?Psychiatric Specialty Exam: ?Physical Exam  ?Review of Systems  ?Weight 268 lb (121.6 kg).There is no height or weight on file to calculate BMI.  ?General Appearance: Casual  ?Eye Contact:  Good  ?Speech:  Clear and Coherent  ?Volume:  Normal  ?Mood:  Euthymic  ?Affect:  Congruent  ?Thought Process:  Goal Directed  ?Orientation:  Full (Time, Place, and Person)  ?Thought Content:  WDL  ?Suicidal Thoughts:  No  ?Homicidal Thoughts:  No  ?Memory:   Immediate;   Good ?Recent;   Good ?Remote;   Good  ?Judgement:  Good  ?Insight:  Present  ?Psychomotor Activity:  Normal  ?Concentration:  Concentration: Good and Attention Span: Good  ?Recall:  Good  ?Fund of Knowledge:  Good  ?Language:  Good  ?Akathisia:  No  ?Handed:  Right  ?AIMS (if indicated):     ?Assets:  Communication Skills ?Desire for Improvement ?Housing ?Resilience ?Talents/Skills ?Transportation  ?ADL's:  Intact  ?Cognition:  WNL  ?Sleep:   ok  ? ? ? ?Assessment and Plan: ?Major depressive disorder, recurrent.  Anxiety. ? ?Patient is stable on her current medication which she takes on a regular basis.  She also in therapy with Alice Bowers.  Continue Lamictal 150 mg daily, Wellbutrin SR 150 mg twice a day and Lexapro 20 mg daily.  Recommended to call us back if she has any question or any concern.  Follow-up in 3 months. ? ?Follow Up Instructions: ? ?  ?I discussed the assessment and treatment plan with the patient. The patient was provided an opportunity to ask questions and all were answered. The patient agreed with the plan and demonstrated an understanding of the instructions. ?  ?The patient was advised to call back or seek an in-person evaluation if the symptoms worsen or if the condition fails to improve as anticipated. ? ?Collaboration of Care: Other provider involved in patient's care AEB notes are in the epic to review. ? ?Patient/Guardian was  advised Release of Information must be obtained prior to any record release in order to collaborate their care with an outside provider. Patient/Guardian was advised if they have not already done so to contact the registration department to sign all necessary forms in order for [name] to release information regarding their care.  ? ?Consent: Patient/Guardian gives verbal consent for treatment and assignment of benefits for services provided during this visit. Patient/Guardian expressed understanding and agreed to proceed.   ? ?I provided 21 minutes of  non-face-to-face time during this encounter. ? ? ?Kathlee Nations, MD  ?

## 2021-11-18 ENCOUNTER — Ambulatory Visit (HOSPITAL_COMMUNITY): Payer: Medicare PPO | Admitting: Licensed Clinical Social Worker

## 2022-01-05 ENCOUNTER — Ambulatory Visit (INDEPENDENT_AMBULATORY_CARE_PROVIDER_SITE_OTHER): Payer: Medicare PPO | Admitting: Licensed Clinical Social Worker

## 2022-01-05 DIAGNOSIS — F331 Major depressive disorder, recurrent, moderate: Secondary | ICD-10-CM | POA: Diagnosis not present

## 2022-01-07 ENCOUNTER — Other Ambulatory Visit (HOSPITAL_COMMUNITY): Payer: Self-pay | Admitting: Family Medicine

## 2022-01-07 ENCOUNTER — Other Ambulatory Visit: Payer: Self-pay | Admitting: Family Medicine

## 2022-01-07 DIAGNOSIS — R918 Other nonspecific abnormal finding of lung field: Secondary | ICD-10-CM

## 2022-01-11 ENCOUNTER — Encounter (HOSPITAL_COMMUNITY): Payer: Self-pay | Admitting: Licensed Clinical Social Worker

## 2022-01-11 NOTE — Progress Notes (Signed)
  Virtual Visit via Video Note  I connected with Alice Bowers on 01/11/22 at  4:30 PM EDT by a video enabled telemedicine application and verified that I am speaking with the correct person using two identifiers.  Location: Patient: home Provider: home office   I discussed the limitations of evaluation and management by telemedicine and the availability of in person appointments. The patient expressed understanding and agreed to proceed.    I discussed the assessment and treatment plan with the patient. The patient was provided an opportunity to ask questions and all were answered. The patient agreed with the plan and demonstrated an understanding of the instructions.   The patient was advised to call back or seek an in-person evaluation if the symptoms worsen or if the condition fails to improve as anticipated.  I provided 45 minutes of non-face-to-face time during this encounter.   [name], LCSW   THERAPIST PROGRESS NOTE  Session Time: 4:30pm-5:15pm  Participation Level: Active  Behavioral Response: Well GroomedAlertEuthymic  Type of Therapy: Individual Therapy  Treatment Goals addressed: "I need somebody to talk to about my problems so I won't get snappy with the other people in my life"  ProgressTowards Goals: Progressing  Interventions: CBT  Summary: Alice Bowers is a 71 y.o. female who presents with MDD, mild.   Suicidal/Homicidal: Nowithout intent/plan  Therapist Response: Alice Bowers engaged well in individual telehealth session. Clinician received and processed emotional responses to updates in Alice Bowers's life. Clinician utilized CBT to discuss thoughts and feelings about her sister's death last week. Clinician processed feelings of grief, including frustration and disappointment that her sister never apologized or made up for the blow up that occurred at their last visit. Clinician explored Alice Bowers's feelings and noted no tears have been shed and  likely none would be. Clinician offered that tears may not be for her sister, but for the loss and her own grief. Clinician encouraged Alice Bowers to let go of pain and resentment in order to continue her movement forward.     Plan: Return again in 4 weeks.  Diagnosis: Major depressive disorder, recurrent episode, moderate ([place])  Collaboration of Care: Other none required in this session  Patient/Guardian was advised Release of Information must be obtained prior to any record release in order to collaborate their care with an outside provider. Patient/Guardian was advised if they have not already done so to contact the registration department to sign all necessary forms in order for [name] to release information regarding their care.   Consent: Patient/Guardian gives verbal consent for treatment and assignment of benefits for services provided during this visit. Patient/Guardian expressed understanding and agreed to proceed.   [place], LCSW 01/11/2022

## 2022-01-26 ENCOUNTER — Ambulatory Visit (HOSPITAL_COMMUNITY): Payer: Medicare PPO | Admitting: Licensed Clinical Social Worker

## 2022-01-27 ENCOUNTER — Ambulatory Visit (HOSPITAL_COMMUNITY)
Admission: RE | Admit: 2022-01-27 | Discharge: 2022-01-27 | Disposition: A | Discharge status: Home / Self Care | Payer: Medicare PPO | Source: Ambulatory Visit | Attending: Family Medicine | Admitting: Family Medicine

## 2022-01-27 DIAGNOSIS — R918 Other nonspecific abnormal finding of lung field: Secondary | ICD-10-CM | POA: Diagnosis present

## 2022-01-29 ENCOUNTER — Other Ambulatory Visit (HOSPITAL_COMMUNITY): Payer: Self-pay | Admitting: Psychiatry

## 2022-01-29 DIAGNOSIS — F331 Major depressive disorder, recurrent, moderate: Secondary | ICD-10-CM

## 2022-01-29 DIAGNOSIS — F419 Anxiety disorder, unspecified: Secondary | ICD-10-CM

## 2022-02-09 ENCOUNTER — Ambulatory Visit (HOSPITAL_COMMUNITY): Payer: Medicare PPO | Admitting: Licensed Clinical Social Worker

## 2022-02-17 ENCOUNTER — Encounter (HOSPITAL_COMMUNITY): Payer: Self-pay | Admitting: Psychiatry

## 2022-02-17 ENCOUNTER — Telehealth (HOSPITAL_BASED_OUTPATIENT_CLINIC_OR_DEPARTMENT_OTHER): Payer: Medicare PPO | Admitting: Psychiatry

## 2022-02-17 DIAGNOSIS — F331 Major depressive disorder, recurrent, moderate: Secondary | ICD-10-CM

## 2022-02-17 DIAGNOSIS — F419 Anxiety disorder, unspecified: Secondary | ICD-10-CM | POA: Diagnosis not present

## 2022-02-17 MED ORDER — LAMOTRIGINE 150 MG PO TABS: 150.0000 mg | ORAL_TABLET | Freq: Every day | ORAL | 2 refills | Status: DC

## 2022-02-17 MED ORDER — BUPROPION HCL ER (SR) 150 MG PO TB12: ORAL_TABLET | ORAL | 2 refills | Status: DC

## 2022-02-17 MED ORDER — ESCITALOPRAM OXALATE 20 MG PO TABS: 20.0000 mg | ORAL_TABLET | Freq: Every day | ORAL | 2 refills | Status: DC

## 2022-02-17 NOTE — Progress Notes (Signed)
Virtual Visit via Video Note  I connected with Alice Bowers on 02/17/22 at  3:40 PM EDT by a video enabled telemedicine application and verified that I am speaking with the correct person using two identifiers.  Location: Patient: Home Provider: Home Office   I discussed the limitations of evaluation and management by telemedicine and the availability of in person appointments. The patient expressed understanding and agreed to proceed.  History of Present Illness: Patient is evaluated by video session.  She reported recently increased anxiety because of finances.  Patient told she has a new boss who does not give as many routes and last week she only makes $300.  She used to make more than $800 and she is not sure if she can continue to work like this.  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  Patient also concerns because recently she had found 2 nodules in her chest and she has a follow-up in 3 months.  Overall she feels things are going very well other than financial issues.  She sleeps good.  She denies any agitation, anger, mania, crying spells or any feeling of hopelessness.  She is in therapy with [name].  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  Patient taking gabapentin for the neuropathy.  She reported it is helping her.  Past Psychiatric History:  H/O depression since 45 when going through divorce.  H/O 4 inpatient treatment. H/O suicidal attempt by taking overdose on Seroquel.  No h/o paranoia, psychosis or aggressive behavior.  Tried Paxil, Zoloft, Remeron (weight gain) and Seroquel with limited response.   Psychiatric Specialty Exam: Physical Exam  Review of Systems  Weight 265 lb (120.2 kg).There is no height or weight on file to calculate BMI.  General Appearance: Casual  Eye Contact:  Good  Speech:  Clear and Coherent and Normal Rate   Volume:  Normal  Mood:  Anxious  Affect:  Congruent  Thought Process:  Goal Directed  Orientation:  Full (Time, Place, and Person)  Thought Content:  Rumination  Suicidal Thoughts:  No  Homicidal Thoughts:  No  Memory:  Immediate;   Good Recent;   Good Remote;   Good  Judgement:  Good  Insight:  Present  Psychomotor Activity:  Normal  Concentration:  Concentration: Good and Attention Span: Good  Recall:  Good  Fund of Knowledge:  Good  Language:  Good  Akathisia:  No  Handed:  Right  AIMS (if indicated):     Assets:  Communication Skills Desire for Improvement Housing Talents/Skills Transportation  ADL's:  Intact  Cognition:  WNL  Sleep:   ok      Assessment and Plan: Major depressive disorder, recurrent.  Anxiety.  Discussed psychosocial stressors as patient is not making money as she used to in the past.  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  Encourage if she feels more nervous and anxious may need to see a therapist more frequently.  Patient does not want to change the medication because she does not feel changing the medication will solve the problem.  Continue Wellbutrin SR 150 mg twice a day, Lexapro 20 mg daily and Lamictal 150 mg daily.  She is also taking gabapentin for neuropathy.  She has no rash or itching.  Recommended to call us back if she is any question or any concern.  Follow-up in 3 months.  Follow Up Instructions:    I discussed the assessment and treatment plan with the patient. The patient was provided an opportunity to ask questions and all were answered. The patient agreed with the plan and demonstrated an understanding of the instructions.   The patient was advised to call back or seek an in-person evaluation if the symptoms worsen or if the condition fails to improve as anticipated.  Collaboration of Care: Primary Care Provider AEB notes are available in epic to review.  Patient/Guardian was advised Release of Information must be  obtained prior to any record release in order to collaborate their care with an outside provider. Patient/Guardian was advised if they have not already done so to contact the registration department to sign all necessary forms in order for [name] to release information regarding their care.   Consent: Patient/Guardian gives verbal consent for treatment and assignment of benefits for services provided during this visit. Patient/Guardian expressed understanding and agreed to proceed.    I provided 24 minutes of non-face-to-face time during this encounter.   Kathlee Nations, MD

## 2022-03-01 ENCOUNTER — Ambulatory Visit (HOSPITAL_COMMUNITY): Payer: Medicare PPO | Admitting: Licensed Clinical Social Worker

## 2022-03-02 ENCOUNTER — Ambulatory Visit (INDEPENDENT_AMBULATORY_CARE_PROVIDER_SITE_OTHER): Payer: Medicare PPO | Admitting: Licensed Clinical Social Worker

## 2022-03-02 DIAGNOSIS — F330 Major depressive disorder, recurrent, mild: Secondary | ICD-10-CM

## 2022-03-04 ENCOUNTER — Encounter (HOSPITAL_COMMUNITY): Payer: Self-pay | Admitting: Licensed Clinical Social Worker

## 2022-03-04 NOTE — Progress Notes (Signed)
Virtual Visit via Video Note  I connected with Alice Bowers on 03/04/22 at 10:00 AM EDT by a video enabled telemedicine application and verified that I am speaking with the correct person using two identifiers.  Location: Patient: [place] [facility] Provider: home office   I discussed the limitations of evaluation and management by telemedicine and the availability of in person appointments. The patient expressed understanding and agreed to proceed.    I discussed the assessment and treatment plan with the patient. The patient was provided an opportunity to ask questions and all were answered. The patient agreed with the plan and demonstrated an understanding of the instructions.   The patient was advised to call back or seek an in-person evaluation if the symptoms worsen or if the condition fails to improve as anticipated.  I provided 45 minutes of non-face-to-face time during this encounter.   [name], LCSW   THERAPIST PROGRESS NOTE  Session Time: 10:00am-10:45am  Participation Level: Active  Behavioral Response: Neat and Well GroomedAlertDepressed  Type of Therapy: Individual Therapy  Treatment Goals addressed: "I need somebody to talk to about my problems so I won't get snappy with the other people in my life"  ProgressTowards Goals: Progressing  Interventions: Motivational Interviewing  Summary: Alice Bowers is a 71 y.o. female who presents with MDD, mild.   Suicidal/Homicidal: Nowithout intent/plan  Therapist Response: Madiline engaged well in virtual session with Pension scheme manager. Clinician utilized MI OARS to reflect and summarize thoughts and feelings. Alice Bowers was getting an IVIG infusion at the time of session. She agreed to continue the session while she was there, as it was a 4-5 hour procedure where she was sitting and waiting for it to be completed. Laurence shared updates with work and family. Clinician discussed communication and interactions  with son-in-law, granddaughter, and son-in-law's mother. Clinician noted the ongoing support from her neighbors and explored interactions with others in her life. Clinician explored overall mood. Alice Bowers shared that she has a lot of worry about her health, as there are some nodules in her lung. She shared the importance of being proactive in treatment, as she wants to live as long as possible to be with her granddaughter.   Plan: Return again in 3 weeks.  Diagnosis: MDD (major depressive disorder), recurrent episode, mild ([place])  Collaboration of Care: Other none required  Patient/Guardian was advised Release of Information must be obtained prior to any record release in order to collaborate their care with an outside provider. Patient/Guardian was advised if they have not already done so to contact the registration department to sign all necessary forms in order for Alice Bowers to release information regarding their care.   Consent: Patient/Guardian gives verbal consent for treatment and assignment of benefits for services provided during this visit. Patient/Guardian expressed understanding and agreed to proceed.   Jobe Marker [place], LCSW 03/04/2022

## 2022-03-31 ENCOUNTER — Ambulatory Visit (INDEPENDENT_AMBULATORY_CARE_PROVIDER_SITE_OTHER): Payer: Medicare PPO | Admitting: Licensed Clinical Social Worker

## 2022-03-31 ENCOUNTER — Encounter (HOSPITAL_COMMUNITY): Payer: Self-pay | Admitting: Licensed Clinical Social Worker

## 2022-03-31 DIAGNOSIS — F331 Major depressive disorder, recurrent, moderate: Secondary | ICD-10-CM | POA: Diagnosis not present

## 2022-03-31 NOTE — Progress Notes (Signed)
Virtual Visit via Video Note  I connected with Alice Bowers on 03/31/22 at  9:00 AM EDT by a video enabled telemedicine application and verified that I am speaking with the correct person using two identifiers.  Location: Patient: home Provider: home office   I discussed the limitations of evaluation and management by telemedicine and the availability of in person appointments. The patient expressed understanding and agreed to proceed.    I discussed the assessment and treatment plan with the patient. The patient was provided an opportunity to ask questions and all were answered. The patient agreed with the plan and demonstrated an understanding of the instructions.   The patient was advised to call back or seek an in-person evaluation if the symptoms worsen or if the condition fails to improve as anticipated.  I provided 50 minutes of non-face-to-face time during this encounter.   [name], LCSW   THERAPIST PROGRESS NOTE  Session Time: 9:00am-9:50am  Participation Level: Active  Behavioral Response: CasualLethargicDepressed  Type of Therapy: Individual Therapy  Treatment Goals addressed: "I need somebody to talk to about my problems so I won't get snappy with the other people in my life"  ProgressTowards Goals: Progressing  Interventions: Motivational Interviewing  Summary: Alice Bowers is a 71 y.o. female who presents with MDD, moderate.   Suicidal/Homicidal: Nowithout intent/plan  Therapist Response: Aldine engaged well in individual virtual session with Pension scheme manager. Clinician utilized MI OARS to reflect and summarize thoughts and feelings. Clinician reflected increased depression, not due to any specific event, but an overall sense of overwhelm at this time. Clinician explored the impact of sister's death on mood. Clinician reflected some unfinished business and disappointment which may subconsciously be hurting her. Clinician discussed activity levels  and identified significant struggles in getting her house clean. Clinician processed the level of mess and identified the importance of taking small steps and focusing on one garbage bag at a time. 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 Clinician reflected the importance of financial security and stability with the reduction of driving as a major plus. Clinician notes that work should begin in the coming weeks.   Plan: Return again in 3 weeks.  Diagnosis: Major depressive disorder, recurrent episode, moderate ([place])  Collaboration of Care: Other none required in this session  Patient/Guardian was advised Release of Information must be obtained prior to any record release in order to collaborate their care with an outside provider. Patient/Guardian was advised if they have not already done so to contact the registration department to sign all necessary forms in order for [name] to release information regarding their care.   Consent: Patient/Guardian gives verbal consent for treatment and assignment of benefits for services provided during this visit. Patient/Guardian expressed understanding and agreed to proceed.   Jobe Marker [place], LCSW 03/31/2022

## 2022-04-05 ENCOUNTER — Other Ambulatory Visit (HOSPITAL_COMMUNITY): Payer: Self-pay | Admitting: Family Medicine

## 2022-04-05 DIAGNOSIS — R918 Other nonspecific abnormal finding of lung field: Secondary | ICD-10-CM

## 2022-04-14 ENCOUNTER — Ambulatory Visit (HOSPITAL_COMMUNITY): Payer: Medicare PPO | Admitting: Licensed Clinical Social Worker

## 2022-04-28 ENCOUNTER — Ambulatory Visit (HOSPITAL_COMMUNITY): Payer: Medicare PPO | Admitting: Licensed Clinical Social Worker

## 2022-05-03 ENCOUNTER — Ambulatory Visit (HOSPITAL_COMMUNITY)
Admission: RE | Admit: 2022-05-03 | Discharge: 2022-05-03 | Disposition: A | Discharge status: Home / Self Care | Payer: Medicare PPO | Source: Ambulatory Visit | Attending: Family Medicine | Admitting: Family Medicine

## 2022-05-03 DIAGNOSIS — R918 Other nonspecific abnormal finding of lung field: Secondary | ICD-10-CM

## 2022-05-11 ENCOUNTER — Other Ambulatory Visit (HOSPITAL_COMMUNITY): Payer: Self-pay | Admitting: Psychiatry

## 2022-05-11 DIAGNOSIS — F331 Major depressive disorder, recurrent, moderate: Secondary | ICD-10-CM

## 2022-05-12 ENCOUNTER — Ambulatory Visit (HOSPITAL_COMMUNITY): Payer: Medicare PPO | Admitting: Licensed Clinical Social Worker

## 2022-05-19 ENCOUNTER — Telehealth (HOSPITAL_BASED_OUTPATIENT_CLINIC_OR_DEPARTMENT_OTHER): Payer: Medicare PPO | Admitting: Psychiatry

## 2022-05-19 ENCOUNTER — Encounter (HOSPITAL_COMMUNITY): Payer: Self-pay | Admitting: Psychiatry

## 2022-05-19 DIAGNOSIS — F331 Major depressive disorder, recurrent, moderate: Secondary | ICD-10-CM | POA: Diagnosis not present

## 2022-05-19 DIAGNOSIS — F419 Anxiety disorder, unspecified: Secondary | ICD-10-CM | POA: Diagnosis not present

## 2022-05-19 MED ORDER — ESCITALOPRAM OXALATE 20 MG PO TABS: 20.0000 mg | ORAL_TABLET | Freq: Every day | ORAL | 2 refills | Status: DC

## 2022-05-19 MED ORDER — BUPROPION HCL ER (SR) 150 MG PO TB12: ORAL_TABLET | ORAL | 2 refills | Status: DC

## 2022-05-19 MED ORDER — LAMOTRIGINE 150 MG PO TABS: 150.0000 mg | ORAL_TABLET | Freq: Every day | ORAL | 2 refills | Status: DC

## 2022-05-19 NOTE — Progress Notes (Signed)
Virtual Visit via Video Note  I connected with Alice Bowers on 05/19/22 at  3:00 PM EDT by a video enabled telemedicine application and verified that I am speaking with the correct person using two identifiers.  Location: Patient: Home Provider: Home Office   I discussed the limitations of evaluation and management by telemedicine and the availability of in person appointments. The patient expressed understanding and agreed to proceed.  History of Present Illness: Patient is evaluated by video session however her camera is not working and she is okay to talk on the phone.  Patient told she started working 2 days a week at [employer] but last week she has shortness of breath and she was taken to the hospital and found to have multiple small pulmonary embolism.  She is not taking Eliquis and given steroids.  She endorsed feeling tired, no motivation to do things.  She denies depression but endorses decreased energy and feels fatigued.  She is still working as needed as a Geophysicist/field seismologist but now she has to think about because that requires a lot of sitting.  She is not sure if prolonged sitting has precipitated the PE but she wants to take some time to consider going back to previous job.  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  She admitted some anxiety and nervousness but denies any crying spells or any feeling of hopelessness or worthlessness.  She is in therapy with [name].  She has taking multiple medication including gabapentin for neuropathy.  She is taking Wellbutrin, Lexapro and Lamictal.  Her appetite is okay.  Her weight is unchanged from the past.   Past Psychiatric History:  H/O depression since 1995 when going through divorce.  H/O 4 inpatient treatment. H/O suicidal attempt by taking overdose on Seroquel.  No h/o paranoia, psychosis or aggressive behavior.  Tried Paxil, Zoloft, Remeron (weight gain) and Seroquel with limited  response.   Psychiatric Specialty Exam: Physical Exam  Review of Systems  Weight 265 lb (120.2 kg).There is no height or weight on file to calculate BMI.  General Appearance: NA  Eye Contact:  NA  Speech:  Clear and Coherent  Volume:  Normal  Mood:  Anxious  Affect:  NA  Thought Process:  Goal Directed  Orientation:  Full (Time, Place, and Person)  Thought Content:  Rumination  Suicidal Thoughts:  No  Homicidal Thoughts:  No  Memory:  Immediate;   Good Recent;   Good Remote;   Good  Judgement:  Intact  Insight:  Present  Psychomotor Activity:  NA  Concentration:  Concentration: Fair and Attention Span: Fair  Recall:  Good  Fund of Knowledge:  Good  Language:  Good  Akathisia:  No  Handed:  Right  AIMS (if indicated):     Assets:  Communication Skills Desire for Improvement Housing Transportation  ADL's:  Intact  Cognition:  WNL  Sleep:   fair      Assessment and Plan: Major depressive disorder, recurrent.  Anxiety.  Patient recently had multiple PE in her lung and now seeing a pulmonologist and taking Eliquis and steroids.  Explain her feeling of tiredness, fatigue could be these new medication which were added.  Patient wanted to increase the Wellbutrin back to 3 a day however I recommend should wait until steroids course finished as it could be due to steroids.  Patient agreed with the plan.  Encouraged to continue therapy with [name].  I reviewed  blood work results from recent visit to the providers.  Continue Wellbutrin SR 150 mg 2 times a day, Lexapro 20 mg daily and Lamictal 150 mg daily.  She has no rash, itching tremors or shakes.  She is getting gabapentin for neuropathy.  Follow-up in 3 months.  Follow Up Instructions:    I discussed the assessment and treatment plan with the patient. The patient was provided an opportunity to ask questions and all were answered. The patient agreed with the plan and demonstrated an understanding of the instructions.    The patient was advised to call back or seek an in-person evaluation if the symptoms worsen or if the condition fails to improve as anticipated.  Collaboration of Care: Other provider involved in patient's care AEB notes are available in epic to review.  Patient/Guardian was advised Release of Information must be obtained prior to any record release in order to collaborate their care with an outside provider. Patient/Guardian was advised if they have not already done so to contact the registration department to sign all necessary forms in order for [name] to release information regarding their care.   Consent: Patient/Guardian gives verbal consent for treatment and assignment of benefits for services provided during this visit. Patient/Guardian expressed understanding and agreed to proceed.    I provided 25 minutes of non-face-to-face time during this encounter.   Kathlee Nations, MD

## 2022-05-25 ENCOUNTER — Ambulatory Visit (HOSPITAL_COMMUNITY): Payer: Medicare PPO | Admitting: Licensed Clinical Social Worker

## 2022-06-09 ENCOUNTER — Ambulatory Visit (INDEPENDENT_AMBULATORY_CARE_PROVIDER_SITE_OTHER): Payer: Medicare PPO | Admitting: Licensed Clinical Social Worker

## 2022-06-09 DIAGNOSIS — F331 Major depressive disorder, recurrent, moderate: Secondary | ICD-10-CM

## 2022-06-14 ENCOUNTER — Encounter (HOSPITAL_COMMUNITY): Payer: Self-pay | Admitting: Licensed Clinical Social Worker

## 2022-06-14 NOTE — Progress Notes (Signed)
Virtual Visit via Video Note  I connected with Alice Bowers on 06/14/22 at  9:00 AM EDT by a video enabled telemedicine application and verified that I am speaking with the correct person using two identifiers.  Location: Patient: home Provider: home office   I discussed the limitations of evaluation and management by telemedicine and the availability of in person appointments. The patient expressed understanding and agreed to proceed.      I discussed the assessment and treatment plan with the patient. The patient was provided an opportunity to ask questions and all were answered. The patient agreed with the plan and demonstrated an understanding of the instructions.   The patient was advised to call back or seek an in-person evaluation if the symptoms worsen or if the condition fails to improve as anticipated.  I provided 45 minutes of non-face-to-face time during this encounter.   Alice Curling, LCSW   THERAPIST PROGRESS NOTE  Session Time: 9:00am-9:45am  Participation Level: Active  Behavioral Response: CasualAlertDepressed  Type of Therapy: Individual Therapy  Treatment Goals addressed:  "I need somebody to talk to about my problems so I won't get snappy with the other people in my life"  ProgressTowards Goals: Progressing  Interventions: CBT  Summary: Alice Bowers is a 71 y.o. female who presents with MDD, moderate.   Suicidal/Homicidal: Nowithout intent/plan  Therapist Response: Alice Bowers engaged well in virtual individual session with Pension scheme manager. Clinician utilized CBT to process thoughts, feelings, and interactions with others. Alice Bowers shared positive interactions and improvement in relationship with granddaughter: taking her shopping, talking about puberty, and spending time together. Clinician reflected the joy in spending time together. Clinician explored new job and noted some challenges with getting documentation completed and her health. Clinician  received updates from Alice Bowers about her health, noting some time in the hospital and concerns about her lungs. Clinician processed Alice Bowers's options and noted that Alice Bowers will work until she cannot work anymore.   Plan: Return again in 2-3 weeks.  Diagnosis: Major depressive disorder, recurrent episode, moderate ([place])  Collaboration of Care: Psychiatrist AEB updated Dr. Adele Bowers  Patient/Guardian was advised Release of Information must be obtained prior to any record release in order to collaborate their care with an outside provider. Patient/Guardian was advised if they have not already done so to contact the registration department to sign all necessary forms in order for [name] to release information regarding their care.   Consent: Patient/Guardian gives verbal consent for treatment and assignment of benefits for services provided during this visit. Patient/Guardian expressed understanding and agreed to proceed.   Alice Marker Piggott, LCSW 06/14/2022

## 2022-06-30 ENCOUNTER — Ambulatory Visit (HOSPITAL_COMMUNITY): Payer: Medicare PPO | Admitting: Licensed Clinical Social Worker

## 2022-07-06 ENCOUNTER — Ambulatory Visit
Admission: EM | Admit: 2022-07-06 | Discharge: 2022-07-06 | Disposition: A | Discharge status: Home / Self Care | Payer: Medicare PPO | Attending: Family Medicine | Admitting: Family Medicine

## 2022-07-06 ENCOUNTER — Encounter: Payer: Self-pay | Admitting: Emergency Medicine

## 2022-07-06 DIAGNOSIS — N390 Urinary tract infection, site not specified: Secondary | ICD-10-CM | POA: Insufficient documentation

## 2022-07-06 LAB — POCT URINALYSIS DIP (MANUAL ENTRY)
Glucose, UA: NEGATIVE mg/dL
Ketones, POC UA: NEGATIVE mg/dL
Nitrite, UA: NEGATIVE
Protein Ur, POC: 100 mg/dL — AB
Spec Grav, UA: >=1.03 — AB (ref 1.010–1.025)
Urobilinogen, UA: 1 E.U./dL
pH, UA: 6 (ref 5.0–8.0)

## 2022-07-06 MED ORDER — CIPROFLOXACIN HCL 500 MG PO TABS: 500.0000 mg | ORAL_TABLET | Freq: Two times a day (BID) | ORAL | 0 refills | Status: DC

## 2022-07-06 NOTE — ED Triage Notes (Signed)
Urinary frequency and burning on urination that started this afternoon.

## 2022-07-06 NOTE — ED Provider Notes (Signed)
[facility]    CSN: [id number] Arrival date & time: 07/06/22  1823      History   Chief Complaint No chief complaint on file.   HPI Alice Bowers is a 71 y.o. female.   Presenting today with 1 day history of dysuria, urinary urgency and several days prior to that started having urinary frequency.  Denies fever, chills, flank pain, abdominal pain, nausea vomiting or diarrhea.  So far not trying anything over-the-counter for symptoms.    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[place]) 11/15/2018   Impaired fasting glucose 09/02/2015   Obesity, Class III, BMI 40-49.9 (morbid obesity) ([place]) 05/14/2015   Tobacco dependence 02/11/2015   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/13/2014   Major depressive disorder, recurrent episode, moderate ([place]) 10/03/2014   Recurrent major depressive disorder, in partial remission ([place]) 10/03/2014   OSA on CPAP 02/13/2014   Mixed hyperlipidemia 02/13/2014   Osteoarthritis 02/13/2014   Restless legs syndrome (RLS) 02/13/2014   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      Home Medications    Prior to Admission medications   Medication Sig Start Date End Date Taking? Authorizing Provider  ciprofloxacin (CIPRO) 500 MG tablet Take 1 tablet (500 mg total) by mouth 2 (two) times daily. 07/06/22  Yes Volney American, PA-C  Albuterol Sulfate 108 (90 Base) MCG/ACT AEPB Inhale 1-2 puffs into the lungs 4 (four) times daily as needed (Asthma/Wheezing/ Shortness of breath).    [provider]  amLODipine (NORVASC) 2.5 MG tablet Take 2.5 mg by mouth daily.  05/01/19   [provider]  anastrozole (ARIMIDEX) 1 MG tablet Take 1 mg by mouth daily.    [provider]  atorvastatin (LIPITOR) 40 MG tablet Take 40 mg by mouth daily.    Tempie Hoist, MD  buPROPion [facility] SR) 150 MG 12 hr tablet TAKE ONE TABLET BY MOUTH EVERY MORNING AND AT BEDTIME 05/19/22   [name], Arlyce Harman, MD  calcium-vitamin D 250-100 MG-UNIT tablet Take  1 tablet by mouth daily. 03/11/19   [provider]  ELIQUIS 5 MG TABS tablet Take by mouth. 05/16/22   Dionne Milo, MD  escitalopram (LEXAPRO) 20 MG tablet Take 1 tablet (20 mg total) by mouth daily. 05/19/22   [name], Arlyce Harman, MD  fluticasone (FLONASE) 50 MCG/ACT nasal spray Place 1 spray into both nostrils daily as needed for allergies or rhinitis. 12/13/19   [provider]  gabapentin (NEURONTIN) 300 MG capsule Take 300 mg by mouth 3 (three) times daily.     [provider]  hydrochlorothiazide (HYDRODIURIL) 25 MG tablet Take 25 mg by mouth daily.    [provider]  ipratropium-albuterol (DUONEB) 0.5-2.5 (3) MG/3ML SOLN Take 3 mLs by  nebulization every 6 (six) hours as needed (asthma).    [provider]  lamoTRIgine (LAMICTAL) 150 MG tablet Take 1 tablet (150 mg total) by mouth daily. 05/19/22   [name], Arlyce Harman, MD  losartan (COZAAR) 100 MG tablet Take 100 mg by mouth daily.    [provider]  modafinil (PROVIGIL) 200 MG tablet Take 200 mg by mouth every morning. 09/09/21   [provider]  montelukast (SINGULAIR) 10 MG tablet Take 10 mg by mouth daily. 07/13/21   [provider]  Multiple Vitamin (MULTIVITAMIN WITH MINERALS) TABS tablet Take 1 tablet by mouth daily.    [provider]  pramipexole (MIRAPEX) 0.5 MG tablet Take 0.5 mg by mouth in the morning and at bedtime. 09/29/21   [provider]  Donnal Debar 200-62.5-25 MCG/ACT AEPB Take 1 puff by mouth daily. 09/09/21   [provider]  allopurinol (ZYLOPRIM) 300 MG tablet Take 300 mg by mouth daily.  10/06/19  Tempie Hoist, MD    Family History Family History  Problem Relation Age of Onset   Depression Mother    Hyperlipidemia Mother    Hypertension Mother    Depression Brother    Hyperlipidemia Father    Hypertension Father    Asthma Father     Social History Social History   Tobacco Use   Smoking status: Former    Packs/day: 1.00    Years: 25.00    Total pack years: 25.00    Types: Cigarettes    Quit date: 09/13/2021    Years since quitting: 0.8   Smokeless tobacco: Never  Vaping Use   Vaping Use: Former  Substance Use Topics   Alcohol use: Yes    Comment: rare   Drug use: No     Allergies   Azo [phenazopyridine], Pyridium [phenazopyridine hcl], Requip [ropinirole hcl], Betadine [povidone iodine], Diclofenac, Demerol [meperidine], Edta [edetic acid], Macrobid [nitrofurantoin macrocrystal], Ceclor [cefaclor], Cephalosporins, Dynacirc [isradipine], Keflex [cephalexin], Sulfa antibiotics, and Voltaren [diclofenac sodium]   Review of Systems Review of Systems Per HPI  Physical  Exam Triage Vital Signs ED Triage Vitals  Enc Vitals Group     BP 07/06/22 1922 (!) 164/78     Pulse Rate 07/06/22 1922 89     Resp 07/06/22 1922 18     Temp 07/06/22 1922 97.8 F (36.6 C)     Temp Source 07/06/22 1922 Oral     SpO2 07/06/22 1922 93 %     Weight --      Height --      Head Circumference --      Peak Flow --      Pain Score 07/06/22 1924 4     Pain Loc --      Pain Edu? --  Excl. in GC? --    No data found.  Updated Vital Signs BP (!) 164/78 (BP Location: Left Arm)   Pulse 89   Temp 97.8 F (36.6 C) (Oral)   Resp 18   SpO2 93%   Visual Acuity Right Eye Distance:   Left Eye Distance:   Bilateral Distance:    Right Eye Near:   Left Eye Near:    Bilateral Near:     Physical Exam Vitals and nursing note reviewed.  Constitutional:      Appearance: Normal appearance. She is not ill-appearing.  HENT:     Head: Atraumatic.  Eyes:     Extraocular Movements: Extraocular movements intact.     Conjunctiva/sclera: Conjunctivae normal.  Cardiovascular:     Rate and Rhythm: Normal rate and regular rhythm.     Heart sounds: Normal heart sounds.  Pulmonary:     Effort: Pulmonary effort is normal.     Breath sounds: Normal breath sounds.  Abdominal:     General: Bowel sounds are normal. There is no distension.     Palpations: Abdomen is soft.     Tenderness: There is no abdominal tenderness. There is no right CVA tenderness, left CVA tenderness or guarding.  Musculoskeletal:        General: Normal range of motion.     Cervical back: Normal range of motion and neck supple.  Skin:    General: Skin is warm and dry.  Neurological:     Mental Status: She is alert and oriented to person, place, and time.  Psychiatric:        Mood and Affect: Mood normal.        Thought Content: Thought content normal.        Judgment: Judgment normal.      UC Treatments / Results  Labs (all labs ordered are listed, but only abnormal results are displayed) Labs  Reviewed  POCT URINALYSIS DIP (MANUAL ENTRY) - Abnormal; Notable for the following components:      Result Value   Clarity, UA cloudy (*)    Bilirubin, UA small (*)    Spec Grav, UA >=1.030 (*)    Blood, UA large (*)    Protein Ur, POC =100 (*)    Leukocytes, UA Small (1+) (*)    All other components within normal limits  URINE CULTURE    EKG   Radiology No results found.  Procedures Procedures (including critical care time)  Medications Ordered in UC Medications - No data to display  Initial Impression / Assessment and Plan / UC Course  I have reviewed the triage vital signs and the nursing notes.  Pertinent labs & imaging results that were available during my care of the patient were reviewed by me and considered in my medical decision making (see chart for details).     Urinalysis today with evidence of urinary tract infection.  Urine culture pending, treat with empiric antibiotics and adjust if needed based on urine culture results.  Return for worsening symptoms.  Final Clinical Impressions(s) / UC Diagnoses   Final diagnoses:  Acute lower UTI   Discharge Instructions   None    ED Prescriptions     Medication Sig Dispense Auth. Provider   ciprofloxacin (CIPRO) 500 MG tablet Take 1 tablet (500 mg total) by mouth 2 (two) times daily. 10 tablet Volney American, [place]      PDMP not reviewed this encounter.   Volney American, [place] 07/06/22 1954

## 2022-07-10 LAB — URINE CULTURE: Culture: 60000 — AB

## 2022-07-27 ENCOUNTER — Ambulatory Visit (HOSPITAL_COMMUNITY): Payer: Medicare PPO | Admitting: Licensed Clinical Social Worker

## 2022-07-28 ENCOUNTER — Ambulatory Visit (HOSPITAL_COMMUNITY): Payer: Medicare PPO | Admitting: Licensed Clinical Social Worker

## 2022-08-18 ENCOUNTER — Telehealth (HOSPITAL_BASED_OUTPATIENT_CLINIC_OR_DEPARTMENT_OTHER): Payer: Medicare PPO | Admitting: Psychiatry

## 2022-08-18 ENCOUNTER — Encounter (HOSPITAL_COMMUNITY): Payer: Self-pay | Admitting: Psychiatry

## 2022-08-18 DIAGNOSIS — F331 Major depressive disorder, recurrent, moderate: Secondary | ICD-10-CM

## 2022-08-18 DIAGNOSIS — F419 Anxiety disorder, unspecified: Secondary | ICD-10-CM | POA: Diagnosis not present

## 2022-08-18 MED ORDER — ESCITALOPRAM OXALATE 20 MG PO TABS: 20.0000 mg | ORAL_TABLET | Freq: Every day | ORAL | 2 refills | Status: DC

## 2022-08-18 MED ORDER — BUPROPION HCL ER (SR) 150 MG PO TB12: ORAL_TABLET | ORAL | 2 refills | Status: DC

## 2022-08-18 MED ORDER — LAMOTRIGINE 150 MG PO TABS: 150.0000 mg | ORAL_TABLET | Freq: Every day | ORAL | 2 refills | Status: DC

## 2022-08-18 NOTE — Progress Notes (Signed)
Virtual Visit via Video Note  I connected with Alice Bowers on 08/18/22 at  4:00 PM EST by a video enabled telemedicine application and verified that I am speaking with the correct person using two identifiers.  Location: Patient: Home Provider: Office   I discussed the limitations of evaluation and management by telemedicine and the availability of in person appointments. The patient expressed understanding and agreed to proceed.  History of Present Illness: Patient is evaluated by video session.  She reported that she had to quit the job at [employer] because she was not able to do the charting on time and having issues while she was in orientation.  She reported not enough time given for charting but she has no issue taking care of the patient.  Patient is a Therapist, sports.  She is working as a Geophysicist/field seismologist to shift the medicine but now she has more hours and she endorsed sometimes she gets tired.  In the past she was not getting enough hours but now she has more hours that she can handle.  She still struggle with anxiety, depression, fatigue.  Recently she had a CT scan and she was relieved to hear that spots on her lung from the PE is now resolved.  She is taking Eliquis.  She is also taking multiple medication for her other health needs.  She like to go back on Wellbutrin that she used to take 3 pills a day.  She is in therapy with [name].  She take moderate dose of gabapentin for for neuropathy.  She does not like driving too much especially on the weekends for the work but she has no other choice.  Her Christmas was okay.  She was able to see her granddaughter.  Her appetite is okay.  Her weight is stable.    Past Psychiatric History:  H/O depression since 81 when going through divorce.  H/O 4 inpatient treatment. H/O suicidal attempt by taking overdose on Seroquel.  No h/o paranoia, psychosis or aggressive behavior.  Tried Paxil, Zoloft, Remeron (weight gain) and Seroquel with limited  response.  Psychiatric Specialty Exam: Physical Exam  Review of Systems  Weight 265 lb (120.2 kg).There is no height or weight on file to calculate BMI.  General Appearance: Casual  Eye Contact:  Good  Speech:  Clear and Coherent and Normal Rate  Volume:  Normal  Mood:  Dysphoric  Affect:  Congruent  Thought Process:  Goal Directed  Orientation:  Full (Time, Place, and Person)  Thought Content:  Logical  Suicidal Thoughts:  No  Homicidal Thoughts:  No  Memory:  Immediate;   Good Recent;   Good Remote;   Good  Judgement:  Intact  Insight:  Present  Psychomotor Activity:  Normal  Concentration:  Concentration: Good and Attention Span: Good  Recall:  Good  Fund of Knowledge:  Good  Language:  Good  Akathisia:  No  Handed:  Right  AIMS (if indicated):     Assets:  Communication Skills Desire for Improvement Housing Talents/Skills Transportation  ADL's:  Intact  Cognition:  WNL  Sleep:   fair      Assessment and Plan: Major depressive disorder, recurrent with anxiety.  Review current medication.  Patient like to go up on the Wellbutrin which he used to take 450 mg.  It was reduced because she was taking polypharmacy but now she feels she need to go back because she struggles with anxiety, fatigue, lack of motivation to do things.  Her job  requires a lot of driving and she gets fatigued.  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  I discussed possible side effects of polypharmacy including higher dose of Wellbutrin can cause seizures.  Patient told she had took for many years and did not have any problem.  She like to go back because she feel it did help.  We will try going up the dose of Wellbutrin since it helped her in the past.  The new dose will be Wellbutrin SR 150 mg in the morning and 300 at bedtime.  She will continue Lexapro 20 mg daily and Lamictal 150 mg daily.  She also taking moderate dose of gabapentin for neuropathy  from other provider.  Encouraged to continue therapy with [name].  Recommend to call us back if she has any question, concern or if she feels worsening of the symptoms.  Follow-up in 3 months.  Follow Up Instructions:    I discussed the assessment and treatment plan with the patient. The patient was provided an opportunity to ask questions and all were answered. The patient agreed with the plan and demonstrated an understanding of the instructions.   The patient was advised to call back or seek an in-person evaluation if the symptoms worsen or if the condition fails to improve as anticipated.  Collaboration of Care: Other provider involved in patient's care AEB notes are available in epic to review.  Patient/Guardian was advised Release of Information must be obtained prior to any record release in order to collaborate their care with an outside provider. Patient/Guardian was advised if they have not already done so to contact the registration department to sign all necessary forms in order for [name] to release information regarding their care.   Consent: Patient/Guardian gives verbal consent for treatment and assignment of benefits for services provided during this visit. Patient/Guardian expressed understanding and agreed to proceed.    I provided 22 minutes of non-face-to-face time during this encounter.   Kathlee Nations, MD

## 2022-08-24 ENCOUNTER — Ambulatory Visit (HOSPITAL_COMMUNITY): Payer: Medicare PPO | Admitting: Licensed Clinical Social Worker

## 2022-09-07 ENCOUNTER — Ambulatory Visit (INDEPENDENT_AMBULATORY_CARE_PROVIDER_SITE_OTHER): Payer: Medicare PPO | Admitting: Licensed Clinical Social Worker

## 2022-09-07 DIAGNOSIS — F331 Major depressive disorder, recurrent, moderate: Secondary | ICD-10-CM

## 2022-09-08 ENCOUNTER — Encounter (HOSPITAL_COMMUNITY): Payer: Self-pay | Admitting: Licensed Clinical Social Worker

## 2022-09-08 ENCOUNTER — Encounter (HOSPITAL_COMMUNITY): Payer: Self-pay

## 2022-09-08 NOTE — Progress Notes (Signed)
Virtual Visit via Video Note  I connected with Alice Bowers on 09/08/22 at 10:00 AM EST by a video enabled telemedicine application and verified that I am speaking with the correct person using two identifiers.  Location: Patient: home Provider: home office   I discussed the limitations of evaluation and management by telemedicine and the availability of in person appointments. The patient expressed understanding and agreed to proceed.   I discussed the assessment and treatment plan with the patient. The patient was provided an opportunity to ask questions and all were answered. The patient agreed with the plan and demonstrated an understanding of the instructions.   The patient was advised to call back or seek an in-person evaluation if the symptoms worsen or if the condition fails to improve as anticipated.  I provided 45 minutes of non-face-to-face time during this encounter.   [name], LCSW   THERAPIST PROGRESS NOTE  Session Time: 10:00am-10:45am  Participation Level: Active  Behavioral Response: NeatAlertDepressed  Type of Therapy: Individual Therapy  Treatment Goals addressed: Reduce frequency, intensity, and duration of depression symptoms so that daily functioning is improved. Alice Bowers will identify cognitive patterns and beliefs that support depression.    ProgressTowards Goals: Progressing  Interventions: CBT  Summary: Alice Bowers is a 72 y.o. female who presents with MDD, recurrent, moderate.   Suicidal/Homicidal: Nowithout intent/plan  Therapist Response: Alice Bowers engaged well in individual session with clinician. Clinician discussed mood and updates regarding housing, work, and family. Clinician processed thoughts and feelings about recent birthday and lack of communication from her son in [place]. Clinician explored ways that Alice Bowers can reach out and share her feelings with son. Clinician explored housing concerns and noted some progress with her  search. However, she reported feeling somewhat defeated and frustrated that her son-in-law is not being helpful in her search. Clinician discussed work and noted that Alice Bowers continues to need additional financial support due to her expenses and high rent. However, she also shared that she is tired of excessive driving and she would like to be able to retire.   Plan: Return again in 2 weeks.  Diagnosis: Major depressive disorder, recurrent episode, moderate ([place])  Collaboration of Care: Medication Management AEB provided feedback to med management.  Patient/Guardian was advised Release of Information must be obtained prior to any record release in order to collaborate their care with an outside provider. Patient/Guardian was advised if they have not already done so to contact the registration department to sign all necessary forms in order for [name] to release information regarding their care.   Consent: Patient/Guardian gives verbal consent for treatment and assignment of benefits for services provided during this visit. Patient/Guardian expressed understanding and agreed to proceed.   Jobe Marker [place], LCSW 09/08/2022

## 2022-09-15 ENCOUNTER — Other Ambulatory Visit (HOSPITAL_COMMUNITY): Payer: Self-pay | Admitting: Psychiatry

## 2022-09-15 DIAGNOSIS — F331 Major depressive disorder, recurrent, moderate: Secondary | ICD-10-CM

## 2022-09-17 ENCOUNTER — Other Ambulatory Visit (HOSPITAL_COMMUNITY): Payer: Self-pay | Admitting: Psychiatry

## 2022-09-17 DIAGNOSIS — F331 Major depressive disorder, recurrent, moderate: Secondary | ICD-10-CM

## 2022-09-28 ENCOUNTER — Ambulatory Visit (INDEPENDENT_AMBULATORY_CARE_PROVIDER_SITE_OTHER): Payer: Medicare PPO | Admitting: Licensed Clinical Social Worker

## 2022-09-28 DIAGNOSIS — F331 Major depressive disorder, recurrent, moderate: Secondary | ICD-10-CM

## 2022-09-29 ENCOUNTER — Encounter (HOSPITAL_COMMUNITY): Payer: Self-pay | Admitting: Licensed Clinical Social Worker

## 2022-09-29 NOTE — Progress Notes (Signed)
Virtual Visit via Video Note  I connected with Averiana Wuensch on 09/29/22 at 10:00 AM EST by a video enabled telemedicine application and verified that I am speaking with the correct person using two identifiers.  Location: Patient: home Provider: home office   I discussed the limitations of evaluation and management by telemedicine and the availability of in person appointments. The patient expressed understanding and agreed to proceed.   I discussed the assessment and treatment plan with the patient. The patient was provided an opportunity to ask questions and all were answered. The patient agreed with the plan and demonstrated an understanding of the instructions.   The patient was advised to call back or seek an in-person evaluation if the symptoms worsen or if the condition fails to improve as anticipated.  I provided 45 minutes of non-face-to-face time during this encounter.   [name], LCSW   THERAPIST PROGRESS NOTE  Session Time: 10:00am-10:45am  Participation Level: Active  Behavioral Response: CasualAlertDepressed  Type of Therapy: Individual Therapy  Treatment Goals addressed: Reduce frequency, intensity, and duration of depression symptoms so that daily functioning is improved. Solie will identify cognitive patterns and beliefs that support depression.     ProgressTowards Goals: Progressing  Interventions: CBT  Summary: Starla Mall is a 72 y.o. female who presents with MDD, moderate, recurrent.   Suicidal/Homicidal: Nowithout intent/plan  Therapist Response: Marrian engaged well in individual session with clinician. Clinician processed thoughts, feelings, and interactions. Clinician noted that Emanuela has been recovering from RSV and spent a week in the hospital. Clinician explored health issues, noting that she is now dependent on oxygen until she fully heals. Clinician processed family and friends interactions with her while she was in the  hospital. Clinician noted disappointment that her son in law and grandchildren did not reach out. Clinician explored how much she shared with them. Cathaleen reported she had not communicated with anyone except her neighbor who is caring for the dogs. Clinician explroed plan to return to work, noting that she will be unable to do much until off oxygen. Clinician also processed sense of helplessness and financial worries.   Plan: Return again in 2 weeks.  Diagnosis: Major depressive disorder, recurrent episode, moderate ([place])  Collaboration of Care: Primary Care Provider AEB reviewed notes in chart from RSV hospitalization  Patient/Guardian was advised Release of Information must be obtained prior to any record release in order to collaborate their care with an outside provider. Patient/Guardian was advised if they have not already done so to contact the registration department to sign all necessary forms in order for [name] to release information regarding their care.   Consent: Patient/Guardian gives verbal consent for treatment and assignment of benefits for services provided during this visit. Patient/Guardian expressed understanding and agreed to proceed.   Jobe Marker [place], LCSW 09/29/2022

## 2022-10-07 ENCOUNTER — Ambulatory Visit: Payer: Medicare PPO | Admitting: Podiatry

## 2022-10-12 ENCOUNTER — Ambulatory Visit (INDEPENDENT_AMBULATORY_CARE_PROVIDER_SITE_OTHER): Payer: Medicare PPO | Admitting: Licensed Clinical Social Worker

## 2022-10-12 DIAGNOSIS — F331 Major depressive disorder, recurrent, moderate: Secondary | ICD-10-CM

## 2022-10-13 ENCOUNTER — Encounter (HOSPITAL_COMMUNITY): Payer: Self-pay | Admitting: Licensed Clinical Social Worker

## 2022-10-13 NOTE — Progress Notes (Signed)
Virtual Visit via Video Note  I connected with Alice Bowers on 10/13/22 at 10:00 AM EST by a video enabled telemedicine application and verified that I am speaking with the correct person using two identifiers.  Location: Patient: parked car Provider: home office   I discussed the limitations of evaluation and management by telemedicine and the availability of in person appointments. The patient expressed understanding and agreed to proceed.     I discussed the assessment and treatment plan with the patient. The patient was provided an opportunity to ask questions and all were answered. The patient agreed with the plan and demonstrated an understanding of the instructions.   The patient was advised to call back or seek an in-person evaluation if the symptoms worsen or if the condition fails to improve as anticipated.  I provided 45 minutes of non-face-to-face time during this encounter.   [name], LCSW   THERAPIST PROGRESS NOTE  Session Time: 10:00am-10:45am  Participation Level: Active  Behavioral Response: Well GroomedAlertEuthymic  Type of Therapy: Individual Therapy  Treatment Goals addressed: Reduce frequency, intensity, and duration of depression symptoms so that daily functioning is improved. [place] will identify cognitive patterns and beliefs that support depression   ProgressTowards Goals: Progressing  Interventions: CBT  Summary: Alice Bowers is a 72 y.o. female who presents with MDD, recurrent, moderate.   Suicidal/Homicidal: Nowithout intent/plan  Therapist Response: Ellenie engaged well in individual virtual session with Pension scheme manager. Clinician utilized CBT to process thoughts, feelings, and interactions. Clinician explored health and noted that Magalene was not wearing her oxygen as she had been in the previous session. Quintasia shared improvement in health and noted that she is working again. Clinician explored mood and noted ongoing exhaustion,  stress due to finances, and inability to gather the energy to clean her home. Clinician reflected the sense of overwhelm due to not cleaning for a very long time. Clinician explored options to have someone come help her clean. Clinician also discussed plans for future housing. Maira shared that she will stay in her current home and her landlord agreed to leave the rent as is for now. She shared that no other long term plans can be made. Meritt shared coping skills and noted that her dogs have been very important in maintaining her mental health.    Plan: Return again in 2 weeks.  Diagnosis: Major depressive disorder, recurrent episode, moderate ([place])  Collaboration of Care: Patient refused AEB none required  Patient/Guardian was advised Release of Information must be obtained prior to any record release in order to collaborate their care with an outside provider. Patient/Guardian was advised if they have not already done so to contact the registration department to sign all necessary forms in order for [name] to release information regarding their care.   Consent: Patient/Guardian gives verbal consent for treatment and assignment of benefits for services provided during this visit. Patient/Guardian expressed understanding and agreed to proceed.   Jobe Marker [place], LCSW 10/13/2022

## 2022-10-19 ENCOUNTER — Ambulatory Visit: Payer: Medicare PPO | Admitting: Podiatry

## 2022-10-19 ENCOUNTER — Ambulatory Visit (INDEPENDENT_AMBULATORY_CARE_PROVIDER_SITE_OTHER): Payer: Medicare PPO

## 2022-10-19 ENCOUNTER — Encounter: Payer: Self-pay | Admitting: Podiatry

## 2022-10-19 DIAGNOSIS — M7752 Other enthesopathy of left foot: Secondary | ICD-10-CM | POA: Diagnosis not present

## 2022-10-19 DIAGNOSIS — M21621 Bunionette of right foot: Secondary | ICD-10-CM

## 2022-10-19 DIAGNOSIS — M21622 Bunionette of left foot: Secondary | ICD-10-CM | POA: Diagnosis not present

## 2022-10-19 MED ORDER — TRIAMCINOLONE ACETONIDE 10 MG/ML IJ SUSP
10.0000 mg | Freq: Once | INTRAMUSCULAR | Status: AC
  Administered 2022-10-19: 10 mg

## 2022-10-19 NOTE — Progress Notes (Signed)
Subjective:   Patient ID: Alice Bowers, female   DOB: 72 y.o.   MRN: [id number]   HPI Patient states the left second toe has been bothersome and making it hard to wear shoe gear without pain.  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

## 2022-10-26 ENCOUNTER — Ambulatory Visit (HOSPITAL_COMMUNITY): Payer: Medicare PPO | Admitting: Licensed Clinical Social Worker

## 2022-10-26 NOTE — Progress Notes (Incomplete)
Virtual Visit via Video Note  I connected with Aquira Staser on 10/26/22 at 10:00 AM EDT by a video enabled telemedicine application and verified that I am speaking with the correct person using two identifiers.  Location: Patient: home Provider: home office   I discussed the limitations of evaluation and management by telemedicine and the availability of in person appointments. The patient expressed understanding and agreed to proceed.    I discussed the assessment and treatment plan with the patient. The patient was provided an opportunity to ask questions and all were answered. The patient agreed with the plan and demonstrated an understanding of the instructions.   The patient was advised to call back or seek an in-person evaluation if the symptoms worsen or if the condition fails to improve as anticipated.  I provided 45 minutes of non-face-to-face time during this encounter.   [name], LCSW   THERAPIST PROGRESS NOTE  Session Time: 10:00am-10:45am  Participation Level: {BHH PARTICIPATION LEVEL:22264}  Behavioral Response: {Appearance:22683}{BHH LEVEL OF CONSCIOUSNESS:22305}{BHH MOOD:22306}  Type of Therapy: {CHL AMB BH Type of Therapy:21022741}  Treatment Goals addressed: ***  ProgressTowards Goals: {Progress Towards Goals:21014066}  Interventions: {CHL AMB BH Type of Intervention:21022753}  Summary: Navreet Forino is a 72 y.o. female who presents with ***.   Suicidal/Homicidal: {BHH YES OR NO:22294}{yes/no/with/without intent/plan:22693}  Therapist Response: ***  Plan: Return again in *** weeks.  Diagnosis: No diagnosis found.  Collaboration of Care: {BH OP Collaboration of Care:21014065}  Patient/Guardian was advised Release of Information must be obtained prior to any record release in order to collaborate their care with an outside provider. Patient/Guardian was advised if they have not already done so to contact the registration department  to sign all necessary forms in order for [name] to release information regarding their care.   Consent: Patient/Guardian gives verbal consent for treatment and assignment of benefits for services provided during this visit. Patient/Guardian expressed understanding and agreed to proceed.   Jobe Marker [place], LCSW 10/26/2022

## 2022-11-16 ENCOUNTER — Ambulatory Visit (HOSPITAL_COMMUNITY): Payer: Medicare PPO | Admitting: Licensed Clinical Social Worker

## 2022-11-17 ENCOUNTER — Encounter (HOSPITAL_COMMUNITY): Payer: Self-pay | Admitting: Psychiatry

## 2022-11-17 ENCOUNTER — Telehealth (HOSPITAL_BASED_OUTPATIENT_CLINIC_OR_DEPARTMENT_OTHER): Payer: Medicare PPO | Admitting: Psychiatry

## 2022-11-17 DIAGNOSIS — F419 Anxiety disorder, unspecified: Secondary | ICD-10-CM

## 2022-11-17 DIAGNOSIS — F331 Major depressive disorder, recurrent, moderate: Secondary | ICD-10-CM

## 2022-11-17 MED ORDER — ESCITALOPRAM OXALATE 20 MG PO TABS: 20.0000 mg | ORAL_TABLET | Freq: Every day | ORAL | 2 refills | Status: DC

## 2022-11-17 MED ORDER — BUPROPION HCL ER (SR) 150 MG PO TB12: ORAL_TABLET | ORAL | 2 refills | Status: DC

## 2022-11-17 MED ORDER — LAMOTRIGINE 150 MG PO TABS: 150.0000 mg | ORAL_TABLET | Freq: Every day | ORAL | 2 refills | Status: DC

## 2022-11-17 NOTE — Progress Notes (Signed)
[facility] MD Virtual Progress Note   Patient Location: Home Provider Location: Office  I connect with patient by video and verified that I am speaking with correct person by using two identifiers. I discussed the limitations of evaluation and management by telemedicine and the availability of in person appointments. I also discussed with the patient that there may be a patient responsible charge related to this service. The patient expressed understanding and agreed to proceed.  Alice Bowers [id number] 72 y.o.  11/17/2022 3:16 PM  History of Present Illness:  Patient is evaluated by video session.  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  Last week she has to drive [place] is 4-1/2-hour.  She reported it gets very tired, fatigue and tired.  She had applied few jobs which are in the town but have not heard from them.  Patient told she has to work until she get the better job because she has to pay the bills.  She is taking Eliquis but had appointment coming up with the physician to discuss if she needed in the future.  She denies some anxiety and nervousness because of her job but denies any crying spells or any feeling of hopelessness or worthlessness.  Her depression is stable.  She denies any suicidal thoughts or any paranoia.  She denies any panic attack.  She like the higher dose of Wellbutrin which is helping her mood.  She has no tremors, shakes or any EPS.  She has no rash or any itching.  She is taking moderate dose of gabapentin for neuropathy from other provider.  She like to keep the current medication.  Past Psychiatric History: H/O depression since 51 when going through divorce.  H/O 4 inpatient treatment. H/O suicidal attempt by taking overdose on Seroquel.  No h/o paranoia, psychosis or aggressive behavior.  Tried Paxil, Zoloft, Remeron (weight gain) and  Seroquel with limited response.    Outpatient Encounter Medications as of 11/17/2022  Medication Sig   Albuterol Sulfate 108 (90 Base) MCG/ACT AEPB Inhale 1-2 puffs into the lungs 4 (four) times daily as needed (Asthma/Wheezing/ Shortness of breath).   amLODipine (NORVASC) 2.5 MG tablet Take 2.5 mg by mouth daily.    anastrozole (ARIMIDEX) 1 MG tablet Take 1 mg by mouth daily.   atorvastatin (LIPITOR) 40 MG tablet Take 40 mg by mouth daily.   buPROPion (WELLBUTRIN SR) 150 MG 12 hr tablet TAKE ONE TABLET BY MOUTH EVERY MORNING AND TWO AT BEDTIME   calcium-vitamin D 250-100 MG-UNIT tablet Take 1 tablet by mouth daily.   ELIQUIS 5 MG TABS tablet Take by mouth.   escitalopram (LEXAPRO) 20 MG tablet Take 1 tablet (20 mg total) by mouth daily.   fluticasone (FLONASE) 50 MCG/ACT nasal spray Place 1 spray into both nostrils daily as needed for allergies or rhinitis.   gabapentin (NEURONTIN) 300 MG capsule Take 300 mg by mouth 3 (three) times daily.    hydrochlorothiazide (HYDRODIURIL) 25 MG tablet Take 25 mg by mouth daily.   ipratropium-albuterol (DUONEB) 0.5-2.5 (3) MG/3ML SOLN Take 3 mLs by nebulization every 6 (six) hours as needed (asthma).   lamoTRIgine (LAMICTAL) 150 MG tablet Take 1 tablet (150 mg total) by mouth daily.   losartan (COZAAR) 100 MG tablet Take 100 mg by mouth daily.   modafinil (PROVIGIL) 200 MG tablet Take 200 mg by mouth every morning.   Multiple  Vitamin (MULTIVITAMIN WITH MINERALS) TABS tablet Take 1 tablet by mouth daily.   pramipexole (MIRAPEX) 0.5 MG tablet Take 0.5 mg by mouth in the morning and at bedtime.   TRELEGY ELLIPTA 200-62.5-25 MCG/ACT AEPB Take 1 puff by mouth daily.   [DISCONTINUED] allopurinol (ZYLOPRIM) 300 MG tablet Take 300 mg by mouth daily.   No facility-administered encounter medications on file as of 11/17/2022.    No results found for this or any previous visit (from the past 2160 hour(s)).   Psychiatric Specialty Exam: Physical Exam  Review of  Systems  Weight 256 lb (116.1 kg).There is no height or weight on file to calculate BMI.  General Appearance: Casual  Eye Contact:  Good  Speech:  Clear and Coherent  Volume:  Normal  Mood:   tired  Affect:  Appropriate  Thought Process:  Goal Directed  Orientation:  Full (Time, Place, and Person)  Thought Content:  Rumination  Suicidal Thoughts:  No  Homicidal Thoughts:  No  Memory:  Immediate;   Good Recent;   Good Remote;   Good  Judgement:  Intact  Insight:  Present  Psychomotor Activity:  Decreased  Concentration:  Concentration: Good and Attention Span: Good  Recall:  Good  Fund of Knowledge:  Good  Language:  Good  Akathisia:  No  Handed:  Right  AIMS (if indicated):     Assets:  Communication Skills Desire for Improvement Housing Talents/Skills Transportation  ADL's:  Intact  Cognition:  WNL  Sleep:  4-5 hrs     Assessment/Plan: Major depressive disorder, recurrent episode, moderate - Plan: buPROPion (WELLBUTRIN SR) 150 MG 12 hr tablet, escitalopram (LEXAPRO) 20 MG tablet, lamoTRIgine (LAMICTAL) 150 MG tablet  Anxiety - Plan: escitalopram (LEXAPRO) 20 MG tablet  Patient noticed some improvement from increased dose of Wellbutrin.  However her biggest concern is driving for the job which sometimes requires 4 to 5 hours.  She is hoping to get a better job and she had applied and waiting to hear from them.  She is tolerating her medication and denies any side effects.  She is in therapy with [name].  I will continue Wellbutrin SR 150 mg in the morning and 300 mg SR at bedtime, Lexapro 20 mg daily and Lamictal 150 mg daily.  She has no rash or any itching.  Recommend to call us back if he has any question or any concern.  Follow-up in 3 months.   Follow Up Instructions:     I discussed the assessment and treatment plan with the patient. The patient was provided an opportunity to ask questions and all were answered. The patient agreed with the plan and demonstrated  an understanding of the instructions.   The patient was advised to call back or seek an in-person evaluation if the symptoms worsen or if the condition fails to improve as anticipated.    Collaboration of Care: Other provider involved in patient's care AEB notes are available in epic to review.  Patient/Guardian was advised Release of Information must be obtained prior to any record release in order to collaborate their care with an outside provider. Patient/Guardian was advised if they have not already done so to contact the registration department to sign all necessary forms in order for [name] to release information regarding their care.   Consent: Patient/Guardian gives verbal consent for treatment and assignment of benefits for services provided during this visit. Patient/Guardian expressed understanding and agreed to proceed.     I provided 21 minutes of  non face to face time during this encounter.  Kathlee Nations, MD 11/17/2022

## 2022-12-05 ENCOUNTER — Other Ambulatory Visit (HOSPITAL_COMMUNITY): Payer: Self-pay | Admitting: Hematology

## 2022-12-05 DIAGNOSIS — I2699 Other pulmonary embolism without acute cor pulmonale: Secondary | ICD-10-CM

## 2022-12-07 ENCOUNTER — Ambulatory Visit (INDEPENDENT_AMBULATORY_CARE_PROVIDER_SITE_OTHER): Payer: Medicare PPO | Admitting: Licensed Clinical Social Worker

## 2022-12-07 DIAGNOSIS — F331 Major depressive disorder, recurrent, moderate: Secondary | ICD-10-CM

## 2022-12-08 ENCOUNTER — Encounter (HOSPITAL_COMMUNITY): Payer: Self-pay | Admitting: Licensed Clinical Social Worker

## 2022-12-08 NOTE — Progress Notes (Signed)
Virtual Visit via Video Note  I connected with Alice Bowers on 12/08/22 at 10:00 AM EDT by a video enabled telemedicine application and verified that I am speaking with the correct person using two identifiers.  Location: Patient: home Provider: home office   I discussed the limitations of evaluation and management by telemedicine and the availability of in person appointments. The patient expressed understanding and agreed to proceed.   I discussed the assessment and treatment plan with the patient. The patient was provided an opportunity to ask questions and all were answered. The patient agreed with the plan and demonstrated an understanding of the instructions.   The patient was advised to call back or seek an in-person evaluation if the symptoms worsen or if the condition fails to improve as anticipated.  I provided 55 minutes of non-face-to-face time during this encounter.   Alice Melter, LCSW   THERAPIST PROGRESS NOTE  Session Time: 10:00am-10:55am  Participation Level: Active  Behavioral Response: NeatAlertDepressed  Type of Therapy: Individual Therapy  Treatment Goals addressed:  Reduce frequency, intensity, and duration of depression symptoms so that daily functioning is improved. Alice Bowers will identify cognitive patterns and beliefs that support depression   ProgressTowards Goals: Progressing  Interventions: CBT  Summary: Alice Bowers is a 72 y.o. female who presents with MDD, recurrent, moderate.   Suicidal/Homicidal: Nowithout intent/plan  Therapist Response: Alice Bowers engaged well in individual virtual session with Facilities manager. Clinician utilized CBT to process thoughts, feelings, and interactions. Clinician processed recent issues at work related to people not showing up and Alice Bowers taking on longer routes. Clinician validated frustration as well as exhaustion due to long shifts. Clinician explored the costs vs benefits of keeping this job, noting that  it requires a lot of energy and travel, but it helps with bills and some lifestyle adjustment. 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 Clinician encouraged Alice Bowers to make her own memory book and processed her experience going through old photos of daughter.   Plan: Return again in 2-4 weeks.  Diagnosis: Major depressive disorder, recurrent episode, moderate  Collaboration of Care: Patient refused AEB none required  Patient/Guardian was advised Release of Information must be obtained prior to any record release in order to collaborate their care with an outside provider. Patient/Guardian was advised if they have not already done so to contact the registration department to sign all necessary forms in order for [name] to release information regarding their care.   Consent: Patient/Guardian gives verbal consent for treatment and assignment of benefits for services provided during this visit. Patient/Guardian expressed understanding and agreed to proceed.   Alice Heck [place], LCSW 12/08/2022

## 2022-12-14 ENCOUNTER — Ambulatory Visit: Payer: Medicare PPO | Admitting: Podiatry

## 2022-12-14 ENCOUNTER — Telehealth: Payer: Self-pay | Admitting: Urology

## 2022-12-14 DIAGNOSIS — D169 Benign neoplasm of bone and articular cartilage, unspecified: Secondary | ICD-10-CM | POA: Diagnosis not present

## 2022-12-14 DIAGNOSIS — M7752 Other enthesopathy of left foot: Secondary | ICD-10-CM | POA: Diagnosis not present

## 2022-12-14 NOTE — Progress Notes (Signed)
Patient states she has this pain subjective:   Patient ID: Alice Bowers, female   DOB: 72 y.o.   MRN: [id number]   HPI Full lesion between the big toe second toe left foot that is not improving that did not respond to conservative care that we utilized several months ago   ROS      Objective:  Physical Exam  Neurovascular status intact muscle strength found to be adequate inflammation between the hallux and second digit on the left foot with fluid buildup noted and keratotic tissue formation of both big toes right big toe painful     Assessment:  Chronic lesion formation has not responded to conservative trimming padding injection and separation of the digits     Plan:  H&P reviewed at great length and at this point due to the chronicity of symptoms and the amount of pain I have recommended exostectomy hallux and second digits.  I explained procedure risk she wants to have this done and I allowed her to read then signed consent form going over the surgery.  Patient scheduled outpatient surgery all questions were answered today all risk factors discussed with patient understanding total recovery can be approximately 6 months

## 2022-12-14 NOTE — Telephone Encounter (Signed)
DOS - 12/27/22  The following codes do not require a pre-authorization All services are subject to members benefits, exclusions, limitations and other applicable conditions. 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

## 2022-12-19 ENCOUNTER — Ambulatory Visit (HOSPITAL_COMMUNITY)
Admission: RE | Admit: 2022-12-19 | Discharge: 2022-12-19 | Disposition: A | Discharge status: Home / Self Care | Payer: Medicare PPO | Source: Ambulatory Visit | Attending: Hematology | Admitting: Hematology

## 2022-12-19 DIAGNOSIS — I2699 Other pulmonary embolism without acute cor pulmonale: Secondary | ICD-10-CM | POA: Insufficient documentation

## 2022-12-19 MED ORDER — IOHEXOL 350 MG/ML SOLN: 100.0000 mL | Freq: Once | INTRAVENOUS | Status: DC | PRN

## 2022-12-19 MED ORDER — IOHEXOL 350 MG/ML SOLN
100.0000 mL | Freq: Once | INTRAVENOUS | Status: AC | PRN
  Administered 2022-12-19: 100 mL via INTRAVENOUS

## 2022-12-26 ENCOUNTER — Telehealth: Payer: Self-pay

## 2022-12-26 NOTE — Telephone Encounter (Signed)
Received call from Aram Beecham at [facility] that Evita needed to cancel her surgery with Dr. Charlsie Merles on 12/27/2022 due to no transportation. I called and spoke to Noel and she stated she wanted to reschedule. She is checking with her ride to see if she can reschedule to 01/10/2023. She will call me back this afternoon to confirm. Notified Dr. Charlsie Merles

## 2022-12-29 ENCOUNTER — Encounter (HOSPITAL_COMMUNITY): Payer: Self-pay | Admitting: Licensed Clinical Social Worker

## 2022-12-29 ENCOUNTER — Ambulatory Visit (INDEPENDENT_AMBULATORY_CARE_PROVIDER_SITE_OTHER): Payer: Medicare PPO | Admitting: Licensed Clinical Social Worker

## 2022-12-29 DIAGNOSIS — F331 Major depressive disorder, recurrent, moderate: Secondary | ICD-10-CM | POA: Diagnosis not present

## 2022-12-29 NOTE — Progress Notes (Signed)
Virtual Visit via Video Note  I connected with Alice Bowers on 12/29/22 at 1:30pm by a video enabled telemedicine application and verified that I am speaking with the correct person using two identifiers.  Location: Patient: home Provider: home office   I discussed the limitations of evaluation and management by telemedicine and the availability of in person appointments. The patient expressed understanding and agreed to proceed.   I discussed the assessment and treatment plan with the patient. The patient was provided an opportunity to ask questions and all were answered. The patient agreed with the plan and demonstrated an understanding of the instructions.   The patient was advised to call back or seek an in-person evaluation if the symptoms worsen or if the condition fails to improve as anticipated.  I provided 55 minutes of non-face-to-face time during this encounter.   Alice Melter, LCSW   THERAPIST PROGRESS NOTE  Session Time: 1:30pm-2:25pm  Participation Level: Active  Behavioral Response: NeatAlertDepressed and Irritable  Type of Therapy: Individual Therapy  Treatment Goals addressed:  Reduce frequency, intensity, and duration of depression symptoms so that daily functioning is improved. Alice Bowers will identify cognitive patterns and beliefs that support depression   ProgressTowards Goals: Progressing  Interventions: CBT  Summary: Alice Bowers is a 72 y.o. female who presents with MDD, recurrent, moderate.   Suicidal/Homicidal: Nowithout intent/plan  Therapist Response: Alice Bowers engaged well individual virtual session with Facilities manager. Clinician utilized CBT to process updates in health, mood, and interactions. Clinician processed ongoing frustration with the state of her home and the lack of help she has in her natural supports. Clinician explored work and noted that Alice Bowers has been extremely busy and has become exhausted. Clinician explored alternatives to  this current job and noted that the places she would be happy to work are either not hiring, or so understaffed that it would be a very difficult job. Clinician explored mood and worries about finances. Clinician reflected the challenges with living paycheck to paycheck. Clinician also identified the importance of being able to manage her health.    Plan: Return again in 2 weeks.  Diagnosis: Major depressive disorder, recurrent episode, moderate (HCC)  Collaboration of Care: Patient refused AEB none required  Patient/Guardian was advised Release of Information must be obtained prior to any record release in order to collaborate their care with an outside provider. Patient/Guardian was advised if they have not already done so to contact the registration department to sign all necessary forms in order for [name] to release information regarding their care.   Consent: Patient/Guardian gives verbal consent for treatment and assignment of benefits for services provided during this visit. Patient/Guardian expressed understanding and agreed to proceed.   Alice Heck [place], LCSW 12/29/2022

## 2023-01-02 ENCOUNTER — Encounter: Payer: Medicare PPO | Admitting: Podiatry

## 2023-01-09 ENCOUNTER — Encounter: Payer: Self-pay | Admitting: Podiatry

## 2023-01-11 ENCOUNTER — Telehealth: Payer: Self-pay

## 2023-01-11 NOTE — Telephone Encounter (Signed)
Crystalina's surgery with Dr. Charlsie Merles on 01/10/2023 was cancelled by [facility] for the follow reason.  Patient currently wearing ZIO monitor to evaluate cardiac rhythm r/t fatigue. Denies chest pain.  Efraim Kaufmann [place] (01/06/2023 09:44) Anesthesia not approved until cardiac monitor and evaluation are complete. Royal Hawthorn (01/06/2023 10:58)     I left a message for Shakeshia to contact me so we can discuss options.

## 2023-01-12 ENCOUNTER — Ambulatory Visit (HOSPITAL_COMMUNITY): Payer: Medicare PPO | Admitting: Licensed Clinical Social Worker

## 2023-01-16 ENCOUNTER — Encounter: Payer: Medicare PPO | Admitting: Podiatry

## 2023-01-24 ENCOUNTER — Other Ambulatory Visit (HOSPITAL_COMMUNITY): Payer: Self-pay | Admitting: Psychiatry

## 2023-01-24 DIAGNOSIS — F331 Major depressive disorder, recurrent, moderate: Secondary | ICD-10-CM

## 2023-01-30 ENCOUNTER — Encounter: Payer: Medicare PPO | Admitting: Podiatry

## 2023-02-08 ENCOUNTER — Ambulatory Visit (HOSPITAL_COMMUNITY): Payer: Medicare PPO | Admitting: Licensed Clinical Social Worker

## 2023-02-13 ENCOUNTER — Telehealth (HOSPITAL_BASED_OUTPATIENT_CLINIC_OR_DEPARTMENT_OTHER): Payer: Medicare PPO | Admitting: Psychiatry

## 2023-02-13 ENCOUNTER — Encounter (HOSPITAL_COMMUNITY): Payer: Self-pay | Admitting: Psychiatry

## 2023-02-13 VITALS — Wt 256.0 lb

## 2023-02-13 DIAGNOSIS — F419 Anxiety disorder, unspecified: Secondary | ICD-10-CM | POA: Diagnosis not present

## 2023-02-13 DIAGNOSIS — F331 Major depressive disorder, recurrent, moderate: Secondary | ICD-10-CM | POA: Diagnosis not present

## 2023-02-13 MED ORDER — LAMOTRIGINE 150 MG PO TABS: 150.0000 mg | ORAL_TABLET | Freq: Every day | ORAL | 2 refills | Status: DC

## 2023-02-13 MED ORDER — ESCITALOPRAM OXALATE 20 MG PO TABS: 20.0000 mg | ORAL_TABLET | Freq: Every day | ORAL | 2 refills | Status: DC

## 2023-02-13 MED ORDER — BUPROPION HCL ER (SR) 150 MG PO TB12: ORAL_TABLET | ORAL | 2 refills | Status: DC

## 2023-02-13 NOTE — Progress Notes (Signed)
[facility] MD Virtual Progress Note   Patient Location: Home Provider Location: Home Office  I connect with patient by telephone and verified that I am speaking with correct person by using two identifiers. I discussed the limitations of evaluation and management by telemedicine and the availability of in person appointments. I also discussed with the patient that there may be a patient responsible charge related to this service. The patient expressed understanding and agreed to proceed.  Alice Bowers [id number] 72 y.o.  02/13/2023 3:24 PM  History of Present Illness:  Patient is evaluated by phone session.  She is not able to log in for my chart video but able to do phone session.  She reported her job is very busy and tiring but manageable.  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  She noticed sometimes tremors but does not interfere in her daily activities.  We talk about cutting down the Wellbutrin as it may be contributing to her tremors.  She used to take 300 but started to feel depressed and then we increased the dose to 450.  Now she like to go back again to 300 to see if tremors go away.  Overall she feels her depression is stable.  She denies any panic attack, crying spells or any feeling of hopelessness or worthlessness.  She has no immediate plan to change her job as she is more adjusted with her job.  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  She denies any crying spells or any feeling of hopelessness or worthlessness.  Occasionally she thinks about her daughter and get tearful.  Her appetite is okay.  Her weight is unchanged from the past.    Past Psychiatric History: H/O depression since 1995 when going through divorce.  H/O 4 inpatient treatment. H/O suicidal attempt by taking overdose on Seroquel.  No h/o paranoia, psychosis or aggressive behavior.  Tried Paxil, Zoloft, Remeron (weight gain)  and Seroquel with limited response.    Outpatient Encounter Medications as of 02/13/2023  Medication Sig   Albuterol Sulfate 108 (90 Base) MCG/ACT AEPB Inhale 1-2 puffs into the lungs 4 (four) times daily as needed (Asthma/Wheezing/ Shortness of breath).   amLODipine (NORVASC) 2.5 MG tablet Take 2.5 mg by mouth daily.    anastrozole (ARIMIDEX) 1 MG tablet Take 1 mg by mouth daily.   atorvastatin (LIPITOR) 40 MG tablet Take 40 mg by mouth daily.   buPROPion (WELLBUTRIN SR) 150 MG 12 hr tablet TAKE ONE TABLET BY MOUTH EVERY MORNING AND TWO AT BEDTIME   calcium-vitamin D 250-100 MG-UNIT tablet Take 1 tablet by mouth daily.   ELIQUIS 5 MG TABS tablet Take by mouth.   escitalopram (LEXAPRO) 20 MG tablet Take 1 tablet (20 mg total) by mouth daily.   fluticasone (FLONASE) 50 MCG/ACT nasal spray Place 1 spray into both nostrils daily as needed for allergies or rhinitis.   gabapentin (NEURONTIN) 300 MG capsule Take 300 mg by mouth 3 (three) times daily.    hydrochlorothiazide (HYDRODIURIL) 25 MG tablet Take 25 mg by mouth daily.   ipratropium-albuterol (DUONEB) 0.5-2.5 (3) MG/3ML SOLN Take 3 mLs by nebulization every 6 (six) hours as needed (asthma).   lamoTRIgine (LAMICTAL) 150 MG tablet Take 1 tablet (150 mg total) by mouth daily.   losartan (COZAAR) 100 MG tablet Take 100 mg by mouth daily.   modafinil (PROVIGIL) 200 MG tablet Take 200 mg by mouth every  morning.   Multiple Vitamin (MULTIVITAMIN WITH MINERALS) TABS tablet Take 1 tablet by mouth daily.   pramipexole (MIRAPEX) 0.5 MG tablet Take 0.5 mg by mouth in the morning and at bedtime.   TRELEGY ELLIPTA 200-62.5-25 MCG/ACT AEPB Take 1 puff by mouth daily.   [DISCONTINUED] allopurinol (ZYLOPRIM) 300 MG tablet Take 300 mg by mouth daily.   No facility-administered encounter medications on file as of 02/13/2023.    No results found for this or any previous visit (from the past 2160 hour(s)).   Psychiatric Specialty Exam: Physical Exam  Review  of Systems  Neurological:  Positive for tremors.    Weight 256 lb (116.1 kg).There is no height or weight on file to calculate BMI.  General Appearance: NA  Eye Contact:  NA  Speech:  Clear and Coherent  Volume:  Normal  Mood:  Euthymic  Affect:  NA  Thought Process:  Goal Directed  Orientation:  Full (Time, Place, and Person)  Thought Content:  WDL  Suicidal Thoughts:  No  Homicidal Thoughts:  No  Memory:  Immediate;   Good Recent;   Good Remote;   Good  Judgement:  Intact  Insight:  Present  Psychomotor Activity:  Tremor  Concentration:  Concentration: Good and Attention Span: Good  Recall:  Good  Fund of Knowledge:  Good  Language:  Good  Akathisia:  No  Handed:  Right  AIMS (if indicated):     Assets:  Communication Skills Desire for Improvement Housing Resilience Talents/Skills Transportation  ADL's:  Intact  Cognition:  WNL  Sleep:  too much     Assessment/Plan: Major depressive disorder, recurrent episode, moderate (HCC) - Plan: lamoTRIgine (LAMICTAL) 150 MG tablet, escitalopram (LEXAPRO) 20 MG tablet, buPROPion (WELLBUTRIN SR) 150 MG 12 hr tablet  Anxiety - Plan: escitalopram (LEXAPRO) 20 MG tablet  She noticed tremors and discuss maybe going back to 300 mg Wellbutrin.  She is willing to try it but like to have 415 in case she is slipping to depression.  She agreed to cut down on her own Wellbutrin 150 mg twice a day.  I agree if she notices depression worsening and she can take 300 mg at bedtime and she will keep the 150 in the morning.  Recommend to continue therapy with Shanda Bumps.  Continue Lexapro 20 mg daily and Lamictal 150 mg daily.  She has no rash, itching tremors or shakes.  Recommend to call us back if she is any question or any concern.  Follow-up in 3 months   Follow Up Instructions:     I discussed the assessment and treatment plan with the patient. The patient was provided an opportunity to ask questions and all were answered. The patient agreed  with the plan and demonstrated an understanding of the instructions.   The patient was advised to call back or seek an in-person evaluation if the symptoms worsen or if the condition fails to improve as anticipated.    Collaboration of Care: Other provider involved in patient's care AEB notes are available in epic to review  Patient/Guardian was advised Release of Information must be obtained prior to any record release in order to collaborate their care with an outside provider. Patient/Guardian was advised if they have not already done so to contact the registration department to sign all necessary forms in order for [name] to release information regarding their care.   Consent: Patient/Guardian gives verbal consent for treatment and assignment of benefits for services provided during this visit. Patient/Guardian expressed  understanding and agreed to proceed.     I provided 20 minutes of non face to face time during this encounter.  Note: This document was prepared by [employer] voice dictation technology and any errors that results from this process are unintentional.    Cleotis Nipper, MD 02/13/2023

## 2023-02-23 ENCOUNTER — Encounter (HOSPITAL_COMMUNITY): Payer: Self-pay | Admitting: Licensed Clinical Social Worker

## 2023-02-23 ENCOUNTER — Ambulatory Visit (INDEPENDENT_AMBULATORY_CARE_PROVIDER_SITE_OTHER): Payer: Medicare PPO | Admitting: Licensed Clinical Social Worker

## 2023-02-23 DIAGNOSIS — F331 Major depressive disorder, recurrent, moderate: Secondary | ICD-10-CM | POA: Diagnosis not present
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
## 2023-03-30 ENCOUNTER — Ambulatory Visit (INDEPENDENT_AMBULATORY_CARE_PROVIDER_SITE_OTHER): Payer: Medicare PPO | Admitting: Licensed Clinical Social Worker

## 2023-03-30 ENCOUNTER — Encounter (HOSPITAL_COMMUNITY): Payer: Self-pay | Admitting: Licensed Clinical Social Worker

## 2023-03-30 DIAGNOSIS — F331 Major depressive disorder, recurrent, moderate: Secondary | ICD-10-CM
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
## 2023-04-20 ENCOUNTER — Encounter (HOSPITAL_COMMUNITY): Payer: Self-pay | Admitting: Licensed Clinical Social Worker

## 2023-04-20 ENCOUNTER — Ambulatory Visit (INDEPENDENT_AMBULATORY_CARE_PROVIDER_SITE_OTHER): Payer: Medicare PPO | Admitting: Licensed Clinical Social Worker

## 2023-04-20 DIAGNOSIS — F330 Major depressive disorder, recurrent, mild: Secondary | ICD-10-CM | POA: Diagnosis not present

## 2023-04-20 NOTE — Progress Notes (Signed)
Virtual Visit via Video Note  I connected with Madhuri Alphonse on 04/20/23 at  3:30 PM EDT by a video enabled telemedicine application and verified that I am speaking with the correct person using two identifiers.  Location: Patient: car Provider: home office   I discussed the limitations of evaluation and management by telemedicine and the availability of in person appointments. The patient expressed understanding and agreed to proceed.   I discussed the assessment and treatment plan with the patient. The patient was provided an opportunity to ask questions and all were answered. The patient agreed with the plan and demonstrated an understanding of the instructions.   The patient was advised to call back or seek an in-person evaluation if the symptoms worsen or if the condition fails to improve as anticipated.  I provided 40 minutes of non-face-to-face time during this encounter.   [name], LCSW   THERAPIST PROGRESS NOTE  Session Time: 3:30pm-4:10pm  Participation Level: Active  Behavioral Response: Well GroomedAlertEuthymic  Type of Therapy: Individual Therapy  Treatment Goals addressed: Reduce frequency, intensity, and duration of depression symptoms so that daily functioning is improved. Trilba will identify cognitive patterns and beliefs that support depression     ProgressTowards Goals: Progressing  Interventions: CBT  Summary: Meisha Guster is a 72 y.o. female who presents with MDD, recurrent, mild.   Suicidal/Homicidal: Nowithout intent/plan  Therapist Response: Kieleigh engaged well in individual virtual session with Facilities manager. Clinician utilized CBT to process thoughts, feelings, and behaviors. Clinician explored updates in life and with work. 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 She shared this would be really good for her and would get her on a  more typical 9-5 schedule. Clinician discussed qualifications, responsibilities, and interest from management. She reported she has applied this week and will hopefully hear something soon. Clinician checked in on physical and mental health. Stephen reports she feels stable, just ongoing fatigue. She got a new car and hopes to quit smoking to be able to pay the new car payment.   Plan: Return again in 2-3 weeks.  Diagnosis: MDD (major depressive disorder), recurrent episode, mild (HCC)  Collaboration of Care: Patient refused AEB none required  Patient/Guardian was advised Release of Information must be obtained prior to any record release in order to collaborate their care with an outside provider. Patient/Guardian was advised if they have not already done so to contact the registration department to sign all necessary forms in order for [name] to release information regarding their care.   Consent: Patient/Guardian gives verbal consent for treatment and assignment of benefits for services provided during this visit. Patient/Guardian expressed understanding and agreed to proceed.   Chryl Heck [place], LCSW 04/20/2023

## 2023-05-09 ENCOUNTER — Telehealth (HOSPITAL_BASED_OUTPATIENT_CLINIC_OR_DEPARTMENT_OTHER): Payer: Medicare PPO | Admitting: Psychiatry

## 2023-05-09 ENCOUNTER — Telehealth (HOSPITAL_COMMUNITY): Payer: Medicare PPO | Admitting: Psychiatry

## 2023-05-09 ENCOUNTER — Encounter (HOSPITAL_COMMUNITY): Payer: Self-pay | Admitting: Psychiatry

## 2023-05-09 VITALS — Wt 259.0 lb

## 2023-05-09 DIAGNOSIS — F331 Major depressive disorder, recurrent, moderate: Secondary | ICD-10-CM | POA: Diagnosis not present

## 2023-05-09 DIAGNOSIS — F419 Anxiety disorder, unspecified: Secondary | ICD-10-CM | POA: Diagnosis not present

## 2023-05-09 MED ORDER — BUPROPION HCL ER (SR) 150 MG PO TB12
ORAL_TABLET | ORAL | 2 refills | Status: DC
Start: 2023-05-09 — End: 2023-05-09

## 2023-05-09 MED ORDER — BUPROPION HCL ER (SR) 150 MG PO TB12
150.0000 mg | ORAL_TABLET | Freq: Two times a day (BID) | ORAL | 2 refills | Status: DC
Start: 2023-05-09 — End: 2023-09-12

## 2023-05-09 MED ORDER — LAMOTRIGINE 150 MG PO TABS
150.0000 mg | ORAL_TABLET | Freq: Two times a day (BID) | ORAL | 2 refills | Status: DC
Start: 2023-05-09 — End: 2023-09-12

## 2023-05-09 MED ORDER — ESCITALOPRAM OXALATE 20 MG PO TABS
20.0000 mg | ORAL_TABLET | Freq: Every day | ORAL | 2 refills | Status: DC
Start: 2023-05-09 — End: 2023-09-12

## 2023-05-09 NOTE — Progress Notes (Signed)
[facility] MD Virtual Progress Note   Patient Location: In Car Provider Location: Home Office  I connect with patient by video and verified that I am speaking with correct person by using two identifiers. I discussed the limitations of evaluation and management by telemedicine and the availability of in person appointments. I also discussed with the patient that there may be a patient responsible charge related to this service. The patient expressed understanding and agreed to proceed.  Alice Bowers [id number] 72 y.o.  05/09/2023 10:28 AM  History of Present Illness:  Patient is evaluated by video session.  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  She is seeing cardiology and now she is given the diagnosis of cor pulmonale with diastolic failure.  We have cut down her Wellbutrin and she is taking 150 mg twice a day.  She did not see worsening of anxiety or depression.  She has chronic symptoms but stable.  She has no tremors, shakes or any EPS.  She denies any panic attack, crying spells or any suicidal thoughts.  She is in therapy with Shanda Bumps.  She is compliant with Lexapro, Lamictal and Wellbutrin.  Her appetite is fair.  Her energy level is low.  Her weight is unchanged from the past.  She is concerned about her general health but also seeing pulmonology in cardiology and hoping to get some help from them.  Past Psychiatric History: H/O depression since 70 when going through divorce.  H/O 4 inpatient treatment. H/O suicidal attempt by taking overdose on Seroquel.  No h/o paranoia, psychosis or aggressive behavior.  Tried Paxil, Zoloft, Remeron (weight gain) and Seroquel with limited response.      Outpatient Encounter Medications as of 05/09/2023   Medication Sig   Albuterol Sulfate 108 (90 Base) MCG/ACT AEPB Inhale 1-2 puffs into the lungs 4 (four) times daily as needed (Asthma/Wheezing/ Shortness of breath).   amLODipine (NORVASC) 2.5 MG tablet Take 2.5 mg by mouth daily.    anastrozole (ARIMIDEX) 1 MG tablet Take 1 mg by mouth daily.   atorvastatin (LIPITOR) 40 MG tablet Take 40 mg by mouth daily.   buPROPion (WELLBUTRIN SR) 150 MG 12 hr tablet TAKE ONE TABLET BY MOUTH EVERY MORNING AND TWO AT BEDTIME   calcium-vitamin D 250-100 MG-UNIT tablet Take 1 tablet by mouth daily.   ELIQUIS 5 MG TABS tablet Take by mouth.   escitalopram (LEXAPRO) 20 MG tablet Take 1 tablet (20 mg total) by mouth daily.   fluticasone (FLONASE) 50 MCG/ACT nasal spray Place 1 spray into both nostrils daily as needed for allergies or rhinitis.   gabapentin (NEURONTIN) 300 MG capsule Take 300 mg by mouth 3 (three) times daily.    hydrochlorothiazide (HYDRODIURIL) 25 MG tablet Take 25 mg by mouth daily.   ipratropium-albuterol (DUONEB) 0.5-2.5 (3) MG/3ML SOLN Take 3 mLs by nebulization every 6 (six) hours as needed (asthma).   lamoTRIgine (LAMICTAL) 150 MG tablet Take 1 tablet (150 mg total) by mouth daily.   losartan (COZAAR) 100 MG tablet Take 100 mg by mouth daily.   modafinil (PROVIGIL) 200 MG tablet Take 200 mg by mouth every morning.   Multiple Vitamin (MULTIVITAMIN WITH MINERALS) TABS tablet Take 1 tablet by mouth daily.   pramipexole (MIRAPEX) 0.5  MG tablet Take 0.5 mg by mouth in the morning and at bedtime.   TRELEGY ELLIPTA 200-62.5-25 MCG/ACT AEPB Take 1 puff by mouth daily.   [DISCONTINUED] allopurinol (ZYLOPRIM) 300 MG tablet Take 300 mg by mouth daily.   No facility-administered encounter medications on file as of 05/09/2023.    No results found for this or any previous visit (from the past 2160 hour(s)).   Psychiatric Specialty Exam: Physical Exam  Review of Systems  Weight 259 lb (117.5 kg).There is no height or weight on file to calculate  BMI.  General Appearance: Casual  Eye Contact:  Good  Speech:  Slow  Volume:  Normal  Mood:   tired  Affect:  Appropriate  Thought Process:  Goal Directed  Orientation:  Full (Time, Place, and Person)  Thought Content:  Rumination  Suicidal Thoughts:  No  Homicidal Thoughts:  No  Memory:  Immediate;   Good Recent;   Good Remote;   Good  Judgement:  Intact  Insight:  Present  Psychomotor Activity:  Decreased  Concentration:  Concentration: Good and Attention Span: Good  Recall:  Good  Fund of Knowledge:  Good  Language:  Good  Akathisia:  No  Handed:  Right  AIMS (if indicated):     Assets:  Communication Skills Desire for Improvement Housing Talents/Skills Transportation  ADL's:  Intact  Cognition:  WNL  Sleep: Okay     Assessment/Plan: Major depressive disorder, recurrent episode, moderate (HCC) - Plan: escitalopram (LEXAPRO) 20 MG tablet, lamoTRIgine (LAMICTAL) 150 MG tablet, buPROPion (WELLBUTRIN SR) 150 MG 12 hr tablet, DISCONTINUED: buPROPion (WELLBUTRIN SR) 150 MG 12 hr tablet  Anxiety - Plan: escitalopram (LEXAPRO) 20 MG tablet  Her tremors are much better since we cut down the Wellbutrin.  She is taking 150 in the morning and 150 at late evening.  She is seeing a therapist and that has been helpful.  Continue therapy with Shanda Bumps, continue Lexapro 20 mg daily, Lamictal 150 mg daily and Wellbutrin SR 150 mg in the morning and 150 mg at bedtime.  Recommend to call us back if any question or any concern.  Follow-up in 3 months   Follow Up Instructions:     I discussed the assessment and treatment plan with the patient. The patient was provided an opportunity to ask questions and all were answered. The patient agreed with the plan and demonstrated an understanding of the instructions.   The patient was advised to call back or seek an in-person evaluation if the symptoms worsen or if the condition fails to improve as anticipated.    Collaboration of Care: Other  provider involved in patient's care AEB notes are available in epic to review  Patient/Guardian was advised Release of Information must be obtained prior to any record release in order to collaborate their care with an outside provider. Patient/Guardian was advised if they have not already done so to contact the registration department to sign all necessary forms in order for [name] to release information regarding their care.   Consent: Patient/Guardian gives verbal consent for treatment and assignment of benefits for services provided during this visit. Patient/Guardian expressed understanding and agreed to proceed.     I provided 22 minutes of non face to face time during this encounter.  Note: This document was prepared by [employer] voice dictation technology and any errors that results from this process are unintentional.    Cleotis Nipper, MD 05/09/2023

## 2023-05-11 ENCOUNTER — Ambulatory Visit (INDEPENDENT_AMBULATORY_CARE_PROVIDER_SITE_OTHER): Payer: Medicare PPO | Admitting: Licensed Clinical Social Worker

## 2023-05-11 DIAGNOSIS — F330 Major depressive disorder, recurrent, mild: Secondary | ICD-10-CM | POA: Diagnosis not present

## 2023-05-12 ENCOUNTER — Encounter (HOSPITAL_COMMUNITY): Payer: Self-pay | Admitting: Licensed Clinical Social Worker

## 2023-05-12 NOTE — Progress Notes (Signed)
Virtual Visit via Video Note  I connected with Alice Bowers on 05/11/23 at  3:30 PM EDT by a video enabled telemedicine application and verified that I am speaking with the correct person using two identifiers.  Location: Patient: [facility]  Provider: home office   I discussed the limitations of evaluation and management by telemedicine and the availability of in person appointments. The patient expressed understanding and agreed to proceed.   I discussed the assessment and treatment plan with the patient. The patient was provided an opportunity to ask questions and all were answered. The patient agreed with the plan and demonstrated an understanding of the instructions.   The patient was advised to call back or seek an in-person evaluation if the symptoms worsen or if the condition fails to improve as anticipated.  I provided 45 minutes of non-face-to-face time during this encounter.   Alice Melter, LCSW   THERAPIST PROGRESS NOTE  Session Time: 3:30pm-4:15pm  Participation Level: Active  Behavioral Response: Well GroomedAlertEuthymic  Type of Therapy: Individual Therapy  Treatment Goals addressed: Reduce frequency, intensity, and duration of depression symptoms so that daily functioning is improved. Alice Bowers will identify cognitive patterns and beliefs that support depression   ProgressTowards Goals: Progressing  Interventions: Motivational Interviewing  Summary: Alice Bowers is a 72 y.o. female who presents with MDD, recurrent, mild.   Suicidal/Homicidal: Nowithout intent/plan  Therapist Response: Alice Bowers engaged well in individual virtual session. Alice Bowers shared she was getting her infusion, but was able to talk since she had privacy and could not go anywhere. Clinician utilized MI OARS to process thoughts, feelings, and interactions. 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 Alice Bowers shared that the job is 10 minutes from home and will not require driving around or much walking at all. Clinician discussed details of new job, plans to quit old job, and the excitement she feels about this organization and her new boss. Clinician reflected happiness with being able to do an easier job and one with out driving. Clinician reflected the joy in Alice Bowers's face and excitement in her speech.   Plan: Return again in 4 weeks.  Diagnosis: MDD (major depressive disorder), recurrent episode, mild (HCC)  Collaboration of Care: Patient refused AEB none required  Patient/Guardian was advised Release of Information must be obtained prior to any record release in order to collaborate their care with an outside provider. Patient/Guardian was advised if they have not already done so to contact the registration department to sign all necessary forms in order for [name] to release information regarding their care.   Consent: Patient/Guardian gives verbal consent for treatment and assignment of benefits for services provided during this visit. Patient/Guardian expressed understanding and agreed to proceed.   Chryl Heck [place], LCSW 05/12/2023

## 2023-06-06 ENCOUNTER — Other Ambulatory Visit (HOSPITAL_COMMUNITY): Payer: Self-pay | Admitting: Psychiatry

## 2023-06-06 DIAGNOSIS — F331 Major depressive disorder, recurrent, moderate: Secondary | ICD-10-CM

## 2023-06-14 ENCOUNTER — Encounter (HOSPITAL_COMMUNITY): Payer: Self-pay | Admitting: Licensed Clinical Social Worker

## 2023-06-14 ENCOUNTER — Ambulatory Visit (HOSPITAL_COMMUNITY): Payer: Medicare PPO | Admitting: Licensed Clinical Social Worker

## 2023-06-14 DIAGNOSIS — F330 Major depressive disorder, recurrent, mild: Secondary | ICD-10-CM | POA: Diagnosis not present

## 2023-06-14 NOTE — Progress Notes (Signed)
Virtual Visit via Video Note  I connected with Alice Bowers on 06/14/23 at  9:00 AM EDT by a video enabled telemedicine application and verified that I am speaking with the correct person using two identifiers.  Location: Patient: home Provider: home office   I discussed the limitations of evaluation and management by telemedicine and the availability of in person appointments. The patient expressed understanding and agreed to proceed.   I discussed the assessment and treatment plan with the patient. The patient was provided an opportunity to ask questions and all were answered. The patient agreed with the plan and demonstrated an understanding of the instructions.   The patient was advised to call back or seek an in-person evaluation if the symptoms worsen or if the condition fails to improve as anticipated.  I provided 55 minutes of non-face-to-face time during this encounter.   Alice Melter, LCSW   THERAPIST PROGRESS NOTE  Session Time: 9:00am-9:55am  Participation Level: Active  Behavioral Response: NeatAlertEuthymic  Type of Therapy: Individual Therapy  Treatment Goals addressed:  Reduce frequency, intensity, and duration of depression symptoms so that daily functioning is improved. Alice Bowers will identify cognitive patterns and beliefs that support depression   ProgressTowards Goals: Progressing  Interventions: CBT  Summary: Alice Bowers is a 72 y.o. female who presents with MDD, recurrent, mild.   Suicidal/Homicidal: Nowithout intent/plan  Therapist Response: Alice Bowers engaged well in individual virtual session with Facilities manager. Clinician utilized CBT to process thoughts, feelings, and behaviors. Clinician explored updates with new job, noting that everything seems to be better in this position than in her last. Clinician processed interactions with co-workers and Education officer, environmental. Clinician explored challenges with timeframe and expectations for productivity.  Clinician identified options for communicating with supervisor and finding ways to streamline her processes. 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 Clinician discussed scheduling later appointments if possible in order to get appropriate rest.   Plan: Return again in 3 weeks.  Diagnosis: MDD (major depressive disorder), recurrent episode, mild (HCC)  Collaboration of Care: Patient refused AEB none required  Patient/Guardian was advised Release of Information must be obtained prior to any record release in order to collaborate their care with an outside provider. Patient/Guardian was advised if they have not already done so to contact the registration department to sign all necessary forms in order for [name] to release information regarding their care.   Consent: Patient/Guardian gives verbal consent for treatment and assignment of benefits for services provided during this visit. Patient/Guardian expressed understanding and agreed to proceed.   Chryl Heck [place], LCSW 06/14/2023

## 2023-07-05 ENCOUNTER — Encounter (HOSPITAL_COMMUNITY): Payer: Self-pay | Admitting: Licensed Clinical Social Worker

## 2023-07-05 ENCOUNTER — Ambulatory Visit (INDEPENDENT_AMBULATORY_CARE_PROVIDER_SITE_OTHER): Payer: Medicare PPO | Admitting: Licensed Clinical Social Worker

## 2023-07-05 DIAGNOSIS — F330 Major depressive disorder, recurrent, mild: Secondary | ICD-10-CM

## 2023-07-05 NOTE — Progress Notes (Signed)
Virtual Visit via Video Note  I connected with Alice Bowers on 07/05/23 at  9:00 AM EST by a video enabled telemedicine application and verified that I am speaking with the correct person using two identifiers.  Location: Patient: home Provider: home office   I discussed the limitations of evaluation and management by telemedicine and the availability of in person appointments. The patient expressed understanding and agreed to proceed.   I discussed the assessment and treatment plan with the patient. The patient was provided an opportunity to ask questions and all were answered. The patient agreed with the plan and demonstrated an understanding of the instructions.   The patient was advised to call back or seek an in-person evaluation if the symptoms worsen or if the condition fails to improve as anticipated.  I provided 45 minutes of non-face-to-face time during this encounter.   [name], LCSW   THERAPIST PROGRESS NOTE  Session Time: 9:00am-9:45am  Participation Level: Active  Behavioral Response: NeatAlertIrritable and tired  Type of Therapy: Individual Therapy  Treatment Goals addressed:  Reduce frequency, intensity, and duration of depression symptoms so that daily functioning is improved. Alice Bowers will identify cognitive patterns and beliefs that support depression   ProgressTowards Goals: Progressing  Interventions: CBT  Summary: Alice Bowers is a 72 y.o. female who presents with MDD, mild.   Suicidal/Homicidal: Nowithout intent/plan  Therapist Response: Alice Bowers engaged well in individual virtual session with Facilities manager.  Clinician utilized CBT to process thoughts feelings and behaviors.  Clinician explored updates since last session and processed adjustment to new job.  Clinician reflected some concern about her speed in which she does her job.  Alice Bowers shared that she would rather be accurate than fast.  Clinician identified that some combination of  both will likely make her boss happy.  Clinician discussed relationships in the new company and identified that so far she is getting along with everyone well.  Clinician provided time and space for Alice Bowers to process some frustrations in her new job that she will not be able to change.  Clinician reflected frustration and encouraged Alice Bowers to find ways to vent frustration outside of work hours.  Plan: Return again in 4 weeks.  Diagnosis: MDD (major depressive disorder), recurrent episode, mild (HCC)  Collaboration of Care: Medication Management AEB updated Dr. Lolly Mustache   Patient/Guardian was advised Release of Information must be obtained prior to any record release in order to collaborate their care with an outside provider. Patient/Guardian was advised if they have not already done so to contact the registration department to sign all necessary forms in order for [name] to release information regarding their care.   Consent: Patient/Guardian gives verbal consent for treatment and assignment of benefits for services provided during this visit. Patient/Guardian expressed understanding and agreed to proceed.   Chryl Heck [place], LCSW 07/05/2023

## 2023-07-08 ENCOUNTER — Other Ambulatory Visit (HOSPITAL_COMMUNITY): Payer: Self-pay | Admitting: Psychiatry

## 2023-07-08 DIAGNOSIS — F331 Major depressive disorder, recurrent, moderate: Secondary | ICD-10-CM

## 2023-07-08 DIAGNOSIS — F419 Anxiety disorder, unspecified: Secondary | ICD-10-CM

## 2023-07-27 ENCOUNTER — Ambulatory Visit (HOSPITAL_COMMUNITY): Payer: Medicare PPO | Admitting: Licensed Clinical Social Worker

## 2023-07-27 ENCOUNTER — Encounter (HOSPITAL_COMMUNITY): Payer: Self-pay | Admitting: Licensed Clinical Social Worker

## 2023-07-27 DIAGNOSIS — F330 Major depressive disorder, recurrent, mild: Secondary | ICD-10-CM

## 2023-07-27 NOTE — Progress Notes (Signed)
Virtual Visit via Video Note  I connected with Kaleena Hugo on 07/27/23 at  8:00 AM EST by a video enabled telemedicine application and verified that I am speaking with the correct person using two identifiers.  Location: Patient: home Provider: home office   I discussed the limitations of evaluation and management by telemedicine and the availability of in person appointments. The patient expressed understanding and agreed to proceed.   I discussed the assessment and treatment plan with the patient. The patient was provided an opportunity to ask questions and all were answered. The patient agreed with the plan and demonstrated an understanding of the instructions.   The patient was advised to call back or seek an in-person evaluation if the symptoms worsen or if the condition fails to improve as anticipated.  I provided 40 minutes of non-face-to-face time during this encounter.   [name], LCSW   THERAPIST PROGRESS NOTE  Session Time: 8:20am-9:00am  Participation Level: Active  Behavioral Response: NeatAlertEuthymic  Type of Therapy: Individual Therapy  Treatment Goals addressed: Reduce frequency, intensity, and duration of depression symptoms so that daily functioning is improved. [place] will identify cognitive patterns and beliefs that support depression   ProgressTowards Goals: Progressing  Interventions: CBT  Summary: Rebbecca Tober is a 72 y.o. female who presents with MDD, recurrent, mild.   Suicidal/Homicidal: Nowithout intent/plan  Therapist Response: Isola engaged well in individual virtual session with Facilities manager.  Clinician utilized CBT to process thoughts feelings and interactions.  Clinician explored updates and new job and noted improvement overall in attitude about work and productivity.  Clinician reflected the positive aspects of adjustment to new positions and new coworkers/bosses.  Clinician explored mood stability and noted that things  seem to be going well.  Kazuko shared she is enjoying her new colleagues and she is having more communication with her granddaughter via text and phone calls.  Clinician processed Aryan's concerns about finances and noted the need to change her budget and adjust her bills due to new payment times and frequency.  Clinician processed coping skills for daily stressors.  Plan: Return again in 4 weeks.  Diagnosis: MDD (major depressive disorder), recurrent episode, mild (HCC)  Collaboration of Care: Patient refused AEB none required  Patient/Guardian was advised Release of Information must be obtained prior to any record release in order to collaborate their care with an outside provider. Patient/Guardian was advised if they have not already done so to contact the registration department to sign all necessary forms in order for [name] to release information regarding their care.   Consent: Patient/Guardian gives verbal consent for treatment and assignment of benefits for services provided during this visit. Patient/Guardian expressed understanding and agreed to proceed.   Chryl Heck [place], LCSW 07/27/2023

## 2023-08-02 ENCOUNTER — Encounter: Payer: Self-pay | Admitting: Podiatry

## 2023-08-02 ENCOUNTER — Ambulatory Visit (INDEPENDENT_AMBULATORY_CARE_PROVIDER_SITE_OTHER): Payer: Medicare PPO

## 2023-08-02 ENCOUNTER — Ambulatory Visit: Payer: Medicare PPO | Admitting: Podiatry

## 2023-08-02 VITALS — Ht 64.0 in | Wt 260.0 lb

## 2023-08-02 DIAGNOSIS — M2042 Other hammer toe(s) (acquired), left foot: Secondary | ICD-10-CM | POA: Diagnosis not present

## 2023-08-02 DIAGNOSIS — M7752 Other enthesopathy of left foot: Secondary | ICD-10-CM

## 2023-08-02 DIAGNOSIS — D169 Benign neoplasm of bone and articular cartilage, unspecified: Secondary | ICD-10-CM

## 2023-08-02 NOTE — Progress Notes (Signed)
Subjective:   Patient ID: Alice Bowers, female   DOB: 72 y.o.   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  I explained the procedures risk no guarantee as far as healing and patient understands this wants surgery read and then signed consent form understanding risk alternative treatments.  Patient has done numerous conservative care we tried medication we have tried padding we tried trimming and wider shoe gear without relief.  All questions answered scheduled outpatient surgery  X-rays indicate there is an abutment of the head of the distal phalanx digit to left against the proximal phalanx left big toe

## 2023-08-09 ENCOUNTER — Other Ambulatory Visit (HOSPITAL_COMMUNITY): Payer: Self-pay | Admitting: Psychiatry

## 2023-08-09 DIAGNOSIS — F331 Major depressive disorder, recurrent, moderate: Secondary | ICD-10-CM

## 2023-08-15 ENCOUNTER — Telehealth: Payer: Self-pay | Admitting: Podiatry

## 2023-08-15 NOTE — Telephone Encounter (Signed)
 DOS-08/22/23  HAMMERTOE REPAIR 2ND [id number] EXOSTECTOMY 1ST [id number]  [employer] EFFECTIVE DATE- 08/15/21  OOP-$5400.00 WITH REMAINING $0.00 COINSURANCE- 0%  PER THE COHERE PORTAL, PRIOR AUTH IS NOT REQUIRED FOR CPT CODE 71891, ALSO PER THE COHERE PORTAL PRIOR AUTH HAS BEEN APPROVED FOR CPT CODE 71714. GOOD FROM 08/22/23 - 10/20/23  Authorization #[id number]  Tracking #[id number]

## 2023-08-17 ENCOUNTER — Other Ambulatory Visit (HOSPITAL_COMMUNITY): Payer: Self-pay | Admitting: Psychiatry

## 2023-08-17 ENCOUNTER — Ambulatory Visit: Payer: Medicare PPO | Admitting: Podiatry

## 2023-08-17 DIAGNOSIS — F419 Anxiety disorder, unspecified: Secondary | ICD-10-CM

## 2023-08-17 DIAGNOSIS — F331 Major depressive disorder, recurrent, moderate: Secondary | ICD-10-CM

## 2023-08-21 MED ORDER — HYDROCODONE-ACETAMINOPHEN 10-325 MG PO TABS: 1.0000 | ORAL_TABLET | Freq: Three times a day (TID) | ORAL | 0 refills | Status: AC | PRN

## 2023-08-22 ENCOUNTER — Encounter: Payer: Self-pay | Admitting: Podiatry

## 2023-08-22 DIAGNOSIS — M2042 Other hammer toe(s) (acquired), left foot: Secondary | ICD-10-CM | POA: Diagnosis not present

## 2023-08-24 ENCOUNTER — Encounter (HOSPITAL_COMMUNITY): Payer: Self-pay | Admitting: Licensed Clinical Social Worker

## 2023-08-24 ENCOUNTER — Ambulatory Visit (INDEPENDENT_AMBULATORY_CARE_PROVIDER_SITE_OTHER): Payer: Medicare PPO | Admitting: Licensed Clinical Social Worker

## 2023-08-24 DIAGNOSIS — F330 Major depressive disorder, recurrent, mild: Secondary | ICD-10-CM

## 2023-08-24 NOTE — Progress Notes (Signed)
 Virtual Visit via Video Note  I connected with Alice Bowers on 08/24/23 at  9:00 AM EST by a video enabled telemedicine application and verified that I am speaking with the correct person using two identifiers.  Location: Patient: home Provider: home office   I discussed the limitations of evaluation and management by telemedicine and the availability of in person appointments. The patient expressed understanding and agreed to proceed.   I discussed the assessment and treatment plan with the patient. The patient was provided an opportunity to ask questions and all were answered. The patient agreed with the plan and demonstrated an understanding of the instructions.   The patient was advised to call back or seek an in-person evaluation if the symptoms worsen or if the condition fails to improve as anticipated.  I provided 45 minutes of non-face-to-face time during this encounter.   Alice Alice Rosser, LCSW   THERAPIST PROGRESS NOTE  Session Time: 9:00am-9:45am  Participation Level: Active  Behavioral Response: NeatAlertEuthymic  Type of Therapy: Individual Therapy  Treatment Goals addressed:  Reduce frequency, intensity, and duration of depression symptoms so that daily functioning is improved. Alice Bowers will identify cognitive patterns and beliefs that support depression   ProgressTowards Goals: Progressing  Interventions: CBT  Summary: Alice Bowers is a 73 y.o. female who presents with <DD> recurrent, mild.   Suicidal/Homicidal: Nowithout intent/plan  Therapist Response: Alice Bowers engaged well in individual virtual session with facilities manager.  Clinician utilized CBT to process thoughts feelings and interactions.  Clinician explored updates with family and work.  Clinician identified improvement in mood overall with new job, due to improvement in sleep, less driving, and some additional time with her granddaughter.  Alice Bowers that mood has been stable, she has been in  contact with her family on a more regular basis, and she has a little more energy to complete household tasks.  Plan: Return again in 3 weeks.  Diagnosis: MDD (major depressive disorder), recurrent episode, mild (HCC)  Collaboration of Care: Patient refused AEB none required  Patient/Guardian was advised Release of Information must be obtained prior to any record release in order to collaborate their care with an outside provider. Patient/Guardian was advised if they have not already done so to contact the registration department to sign all necessary forms in order for us  to release information regarding their care.   Consent: Patient/Guardian gives verbal consent for treatment and assignment of benefits for services provided during this visit. Patient/Guardian expressed understanding and agreed to proceed.   Alice Alice [place], LCSW 08/24/2023

## 2023-08-28 ENCOUNTER — Ambulatory Visit (INDEPENDENT_AMBULATORY_CARE_PROVIDER_SITE_OTHER): Payer: Medicare PPO | Admitting: Podiatry

## 2023-08-28 ENCOUNTER — Encounter: Payer: Medicare PPO | Admitting: Podiatry

## 2023-08-28 ENCOUNTER — Ambulatory Visit (INDEPENDENT_AMBULATORY_CARE_PROVIDER_SITE_OTHER): Payer: Medicare PPO

## 2023-08-28 ENCOUNTER — Encounter: Payer: Self-pay | Admitting: Podiatry

## 2023-08-28 DIAGNOSIS — M2042 Other hammer toe(s) (acquired), left foot: Secondary | ICD-10-CM | POA: Diagnosis not present

## 2023-08-30 NOTE — Progress Notes (Signed)
 Subjective:   Patient ID: Alice Bowers, female   DOB: 73 y.o.   MRN: [id number]   HPI Patient states doing fine with surgery so far   ROS      Objective:  Physical Exam  Neuro vascular status intact negative Toula' sign noted with patient's left hallux second toe having dressing that was removed healing well wound edges well coapted stitches in place     Assessment:  Doing well post arthroplasty digit to exostectomy hallux left     Plan:  H&P x-ray done sterile dressing reapplied continue open toed shoes and reappoint for suture removal 2 weeks or earlier if needed  X-rays indicate that there is satisfactory resection of the bone good alignment noted

## 2023-09-11 ENCOUNTER — Ambulatory Visit (INDEPENDENT_AMBULATORY_CARE_PROVIDER_SITE_OTHER): Payer: Medicare PPO

## 2023-09-11 ENCOUNTER — Encounter: Payer: Self-pay | Admitting: Podiatry

## 2023-09-11 ENCOUNTER — Ambulatory Visit (INDEPENDENT_AMBULATORY_CARE_PROVIDER_SITE_OTHER): Payer: Medicare PPO | Admitting: Podiatry

## 2023-09-11 VITALS — Ht 64.0 in | Wt 260.0 lb

## 2023-09-11 DIAGNOSIS — M7752 Other enthesopathy of left foot: Secondary | ICD-10-CM

## 2023-09-11 DIAGNOSIS — M2042 Other hammer toe(s) (acquired), left foot: Secondary | ICD-10-CM | POA: Diagnosis not present

## 2023-09-11 MED ORDER — TRIAMCINOLONE ACETONIDE 10 MG/ML IJ SUSP
10.0000 mg | Freq: Once | INTRAMUSCULAR | Status: AC
  Administered 2023-09-11: 10 mg via INTRA_ARTICULAR

## 2023-09-11 NOTE — Progress Notes (Signed)
Subjective:   Patient ID: Alice Bowers, female   DOB: 73 y.o.   MRN: [id number]   HPI Patient states doing well but developing a lot of pain on the outside of the left foot   ROS      Objective:  Physical Exam  Neurovascular status intact with patient found to have well-healed second toe big toe left stitches intact inflammation fluid around the fifth MPJ left lateral side      Assessment:  Doing well post arthroplasty exostectomy with inflammation of the fifth MPJ left     Plan:  H&P reviewed sterile prep injected the fifth MPJ left 3 mg Dexasone Kenalog 5 mg Xylocaine stitches removed wound edges coapted well and patient will be seen back as needed  X-rays indicate satisfactory resection of bone at this time

## 2023-09-12 ENCOUNTER — Encounter (HOSPITAL_COMMUNITY): Payer: Self-pay | Admitting: Psychiatry

## 2023-09-12 ENCOUNTER — Telehealth (HOSPITAL_BASED_OUTPATIENT_CLINIC_OR_DEPARTMENT_OTHER): Payer: Medicare PPO | Admitting: Psychiatry

## 2023-09-12 VITALS — Wt 242.0 lb

## 2023-09-12 DIAGNOSIS — F419 Anxiety disorder, unspecified: Secondary | ICD-10-CM

## 2023-09-12 DIAGNOSIS — F331 Major depressive disorder, recurrent, moderate: Secondary | ICD-10-CM

## 2023-09-12 MED ORDER — BUPROPION HCL ER (SR) 150 MG PO TB12: 150.0000 mg | ORAL_TABLET | Freq: Two times a day (BID) | ORAL | 2 refills | Status: DC

## 2023-09-12 MED ORDER — LAMOTRIGINE 150 MG PO TABS: 150.0000 mg | ORAL_TABLET | Freq: Every day | ORAL | 2 refills | Status: DC

## 2023-09-12 MED ORDER — ESCITALOPRAM OXALATE 20 MG PO TABS: 20.0000 mg | ORAL_TABLET | Freq: Every day | ORAL | 2 refills | Status: DC

## 2023-09-12 NOTE — Progress Notes (Signed)
[facility] MD Virtual Progress Note   Patient Location: Home Provider Location: Home Office  I connect with patient by video and verified that I am speaking with correct person by using two identifiers. I discussed the limitations of evaluation and management by telemedicine and the availability of in person appointments. I also discussed with the patient that there may be a patient responsible charge related to this service. The patient expressed understanding and agreed to proceed.  Alice Bowers [id number] 73 y.o.  09/12/2023 10:25 AM  History of Present Illness:  Patient is evaluated by video session.  She is tired as she came back last night from her work 5 AM.  Patient told she is switched her job and now working closely so she does not have to drive too much.  She really liked her job which is 4 PM to 12 midnight but yesterday was very busy and she had to stated longer.  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  She also seeing a therapist on a regular basis.  She denies any major panic attack, crying spells or any feeling of hopelessness or worthlessness.  She has no rash, itching, tremors or shakes.  She had lost weight since the last visit because she is not eating junk and not driving too much.  She is not more focus on her general health, diet and watching her calorie intake.  She was happy to see her granddaughter on Christmas.  She like to spend more time with granddaughter but her granddaughter's school starts 3 PM and patient's job start at 4 PM.  She is seeing her primary care regularly.  Her last lab was drawn in November which was normal and stable.    Past Psychiatric History: H/O depression since 20 when going through divorce.  H/O 4 inpatient treatment. H/O suicidal attempt by taking overdose on Seroquel.  No h/o paranoia, psychosis or aggressive behavior.  Tried Paxil, Zoloft, Remeron (weight  gain) and Seroquel with limited response.    Outpatient Encounter Medications as of 09/12/2023  Medication Sig   Albuterol Sulfate 108 (90 Base) MCG/ACT AEPB Inhale 1-2 puffs into the lungs 4 (four) times daily as needed (Asthma/Wheezing/ Shortness of breath).   amLODipine (NORVASC) 2.5 MG tablet Take 2.5 mg by mouth daily.    anastrozole (ARIMIDEX) 1 MG tablet Take 1 mg by mouth daily.   atorvastatin (LIPITOR) 40 MG tablet Take 40 mg by mouth daily.   buPROPion (WELLBUTRIN SR) 150 MG 12 hr tablet Take 1 tablet (150 mg total) by mouth 2 (two) times daily.   calcium-vitamin D 250-100 MG-UNIT tablet Take 1 tablet by mouth daily.   ELIQUIS 5 MG TABS tablet Take by mouth.   escitalopram (LEXAPRO) 20 MG tablet Take 1 tablet (20 mg total) by mouth daily.   fluticasone (FLONASE) 50 MCG/ACT nasal spray Place 1 spray into both nostrils daily as needed for allergies or rhinitis.   gabapentin (NEURONTIN) 300 MG capsule Take 300 mg by mouth 3 (three) times daily.    hydrochlorothiazide (HYDRODIURIL) 25 MG tablet Take 25 mg by mouth daily.   ipratropium-albuterol (DUONEB) 0.5-2.5 (3) MG/3ML SOLN Take 3 mLs by nebulization every 6 (six) hours as needed (asthma).   lamoTRIgine (LAMICTAL) 150 MG tablet Take 1 tablet (150 mg total) by mouth 2 (two) times daily.   losartan (COZAAR) 100 MG tablet Take 100 mg by mouth daily.   modafinil (  PROVIGIL) 200 MG tablet Take 200 mg by mouth every morning.   Multiple Vitamin (MULTIVITAMIN WITH MINERALS) TABS tablet Take 1 tablet by mouth daily.   pramipexole (MIRAPEX) 0.5 MG tablet Take 0.5 mg by mouth in the morning and at bedtime.   TRELEGY ELLIPTA 200-62.5-25 MCG/ACT AEPB Take 1 puff by mouth daily.   [DISCONTINUED] allopurinol (ZYLOPRIM) 300 MG tablet Take 300 mg by mouth daily.   No facility-administered encounter medications on file as of 09/12/2023.    No results found for this or any previous visit (from the past 2160 hours).   Psychiatric Specialty  Exam: Physical Exam  Review of Systems  Weight 242 lb (109.8 kg).There is no height or weight on file to calculate BMI.  General Appearance: Casual  Eye Contact:  Fair  Speech:  Normal Rate  Volume:  Normal  Mood:   tired  Affect:  Appropriate  Thought Process:  Goal Directed  Orientation:  Full (Time, Place, and Person)  Thought Content:  WDL  Suicidal Thoughts:  No  Homicidal Thoughts:  No  Memory:  Immediate;   Good Recent;   Good Remote;   Good  Judgement:  Good  Insight:  Good  Psychomotor Activity:  Normal  Concentration:  Concentration: Good and Attention Span: Good  Recall:  Good  Fund of Knowledge:  Good  Language:  Good  Akathisia:  No  Handed:  Right  AIMS (if indicated):     Assets:  Communication Skills Desire for Improvement Housing Talents/Skills Transportation  ADL's:  Intact  Cognition:  WNL  Sleep:  ok     Assessment/Plan: Major depressive disorder, recurrent episode, moderate (HCC) - Plan: buPROPion (WELLBUTRIN SR) 150 MG 12 hr tablet, escitalopram (LEXAPRO) 20 MG tablet, lamoTRIgine (LAMICTAL) 150 MG tablet  Anxiety - Plan: escitalopram (LEXAPRO) 20 MG tablet  It is stable and doing well.  She changed her job and doing much better with the new job.  She reported less driving and more focus on her general health was not eating junk while driving.  Encouraged to continue therapy with Shanda Bumps.  We discussed reducing the medication but patient does not want to change the dose since mood is stable.  Continue Wellbutrin SR 150 mg twice a day, Lamictal 150 mg daily and Lexapro 20 mg daily.  Recommended to call us back if she has any question or any concern.  Follow-up in 3 months   Follow Up Instructions:     I discussed the assessment and treatment plan with the patient. The patient was provided an opportunity to ask questions and all were answered. The patient agreed with the plan and demonstrated an understanding of the instructions.   The patient  was advised to call back or seek an in-person evaluation if the symptoms worsen or if the condition fails to improve as anticipated.    Collaboration of Care: Other provider involved in patient's care AEB notes are available in epic to review  Patient/Guardian was advised Release of Information must be obtained prior to any record release in order to collaborate their care with an outside provider. Patient/Guardian was advised if they have not already done so to contact the registration department to sign all necessary forms in order for [name] to release information regarding their care.   Consent: Patient/Guardian gives verbal consent for treatment and assignment of benefits for services provided during this visit. Patient/Guardian expressed understanding and agreed to proceed.     I provided 18 minutes of non face to  face time during this encounter.  Note: This document was prepared by [employer] voice dictation technology and any errors that results from this process are unintentional.    Cleotis Nipper, MD 09/12/2023

## 2023-09-14 ENCOUNTER — Ambulatory Visit (HOSPITAL_COMMUNITY): Payer: Medicare PPO | Admitting: Licensed Clinical Social Worker

## 2023-09-14 ENCOUNTER — Encounter (HOSPITAL_COMMUNITY): Payer: Self-pay | Admitting: Licensed Clinical Social Worker

## 2023-09-14 DIAGNOSIS — F330 Major depressive disorder, recurrent, mild: Secondary | ICD-10-CM

## 2023-09-14 NOTE — Progress Notes (Signed)
Virtual Visit via Video Note  I connected with Alice Bowers on 09/14/23 at  9:00 AM EST by a video enabled telemedicine application and verified that I am speaking with the correct person using two identifiers.  Location: Patient: home Provider: home office   I discussed the limitations of evaluation and management by telemedicine and the availability of in person appointments. The patient expressed understanding and agreed to proceed.   I discussed the assessment and treatment plan with the patient. The patient was provided an opportunity to ask questions and all were answered. The patient agreed with the plan and demonstrated an understanding of the instructions.   The patient was advised to call back or seek an in-person evaluation if the symptoms worsen or if the condition fails to improve as anticipated.  I provided 50 minutes of non-face-to-face time during this encounter.   Alice Melter, LCSW   THERAPIST PROGRESS NOTE  Session Time: 9:00am-9:50am  Participation Level: Active  Behavioral Response: NeatAlertEuthymic  Type of Therapy: Individual Therapy  Treatment Goals addressed: Reduce frequency, intensity, and duration of depression symptoms so that daily functioning is improved. Alice Bowers will identify cognitive patterns and beliefs that support depression   ProgressTowards Goals: Progressing  Interventions: CBT  Summary: Alice Bowers is a 73 y.o. female who presents with MDD, recurrent, mild.   Suicidal/Homicidal: Nowithout intent/plan  Therapist Response: Alice Bowers engaged well in individual virtual session with Facilities manager.  Clinician utilized CBT to process thoughts feelings and interactions.  Clinician explored updates at work and noted concerns and frustrations about she was receiving on her productivity.  Clinician provided time and space for Alice Bowers to share her feelings. Clinician explored options for stating her case and sharing her time diary, where  she documented her activities throughout her shift.  Clinician explored Alice Bowers's concerns about feedback from coworkers, some disparity in expectations, and worries that this might not be the right job for her.  Clinician discussed pros and cons of the job.  Clinician also identified that the hours and overtime often will keep her out quite late at night.  Clinician discussed coping skills.  Clinician identified the importance of maintaining physical emotional and social wellness.  Plan: Return again in 3 weeks.  Diagnosis: MDD (major depressive disorder), recurrent episode, mild (HCC)  Collaboration of Care: Psychiatrist AEB reviewed note from Dr. Lolly Bowers  Patient/Guardian was advised Release of Information must be obtained prior to any record release in order to collaborate their care with an outside provider. Patient/Guardian was advised if they have not already done so to contact the registration department to sign all necessary forms in order for [name] to release information regarding their care.   Consent: Patient/Guardian gives verbal consent for treatment and assignment of benefits for services provided during this visit. Patient/Guardian expressed understanding and agreed to proceed.   Alice Heck [name], LCSW 09/14/2023

## 2023-09-17 ENCOUNTER — Other Ambulatory Visit (HOSPITAL_COMMUNITY): Payer: Self-pay | Admitting: Psychiatry

## 2023-09-17 DIAGNOSIS — F331 Major depressive disorder, recurrent, moderate: Secondary | ICD-10-CM

## 2023-10-05 ENCOUNTER — Ambulatory Visit (HOSPITAL_COMMUNITY): Payer: Medicare PPO | Admitting: Licensed Clinical Social Worker

## 2023-10-24 ENCOUNTER — Telehealth: Payer: Self-pay | Admitting: Podiatry

## 2023-10-24 NOTE — Telephone Encounter (Signed)
 Patient is requesting Dr. Charlsie Merles take a look at last X-ray and see if left foot, 2nd toe shows toe was dislocated.

## 2023-10-25 NOTE — Telephone Encounter (Signed)
 Was not

## 2023-10-30 IMAGING — CT CT CHEST W/O CM
2 of 4 series · 13 of 36 positions shown, 16 images · non-contrast
Comparison: Chest PA Lat 10/08/2021, 01/25/2020, and chest CT with
contrast 12/24/2018, images only with no available report.

CLINICAL DATA: Follow-up of ground-glass opacity according to
patient.



[Series 2: thorax · axial · 0.88mm/px · z∈[+1451,+1709]mm · 10 of 151 slices shown, 13 images]
[im 11/151  mediastinal]
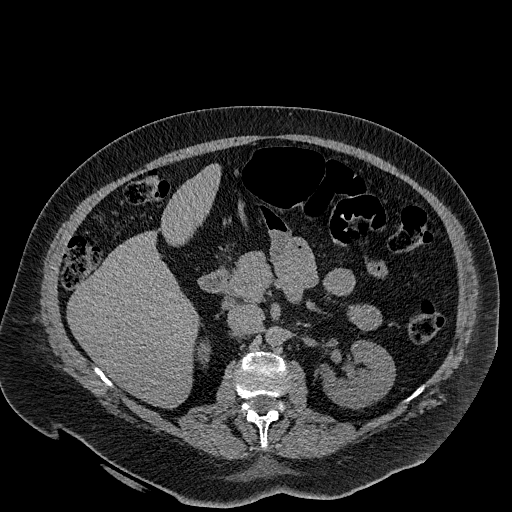
[im 11/151  lung]
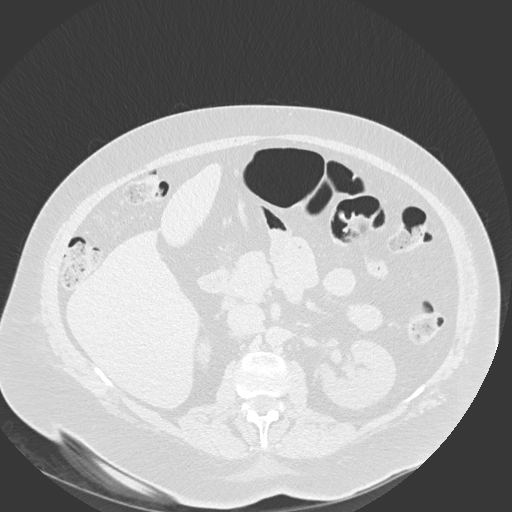
[im 22/151  lung]
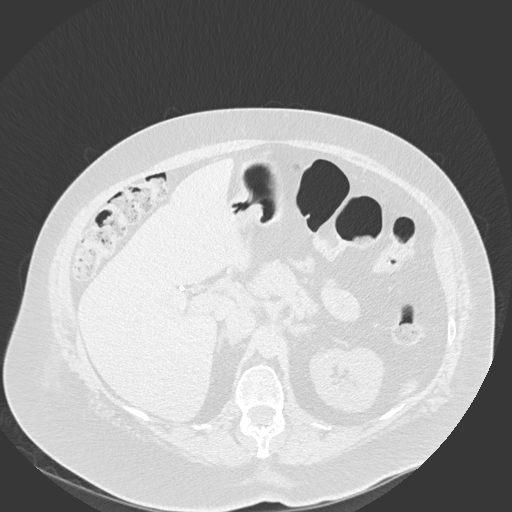
[im 43/151  lung]
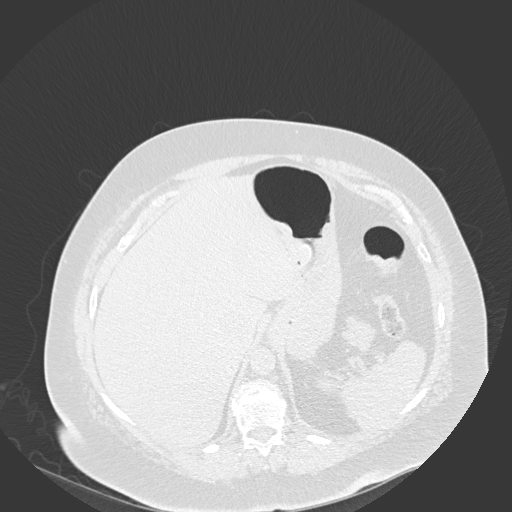
[im 54/151  lung]
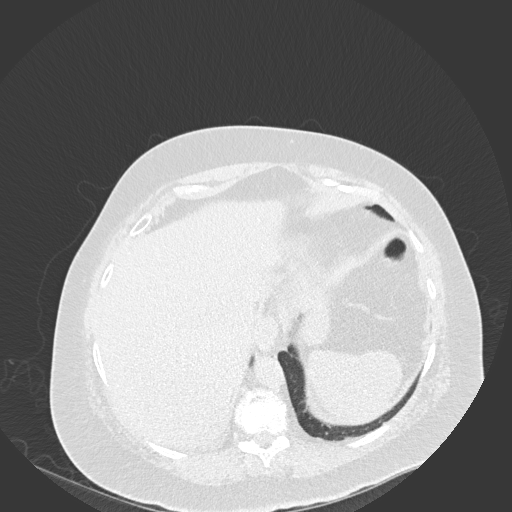
[im 65/151  mediastinal]
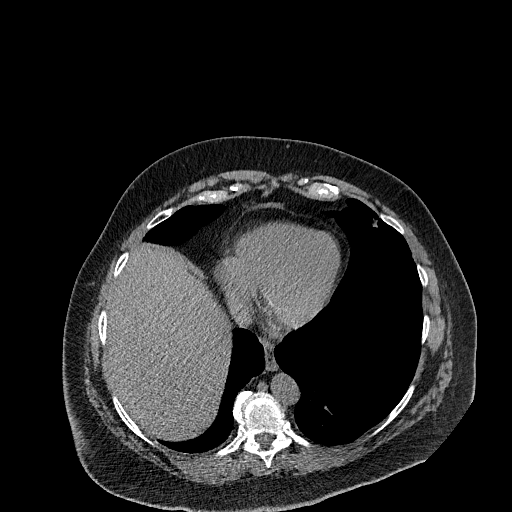
[im 65/151  lung]
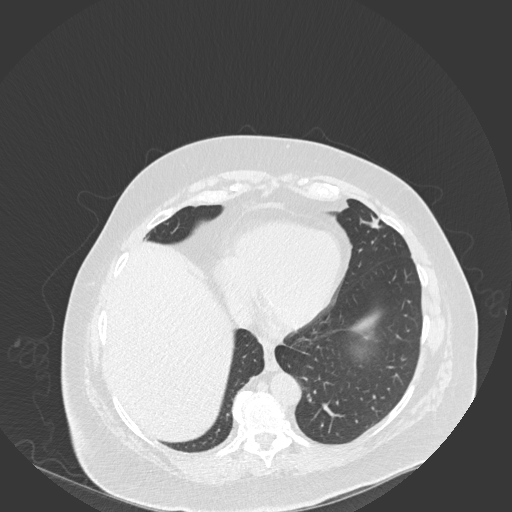
[im 86/151  lung]
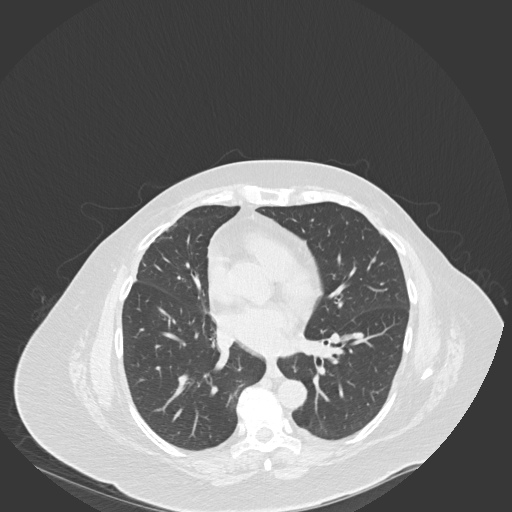
[im 97/151  lung]
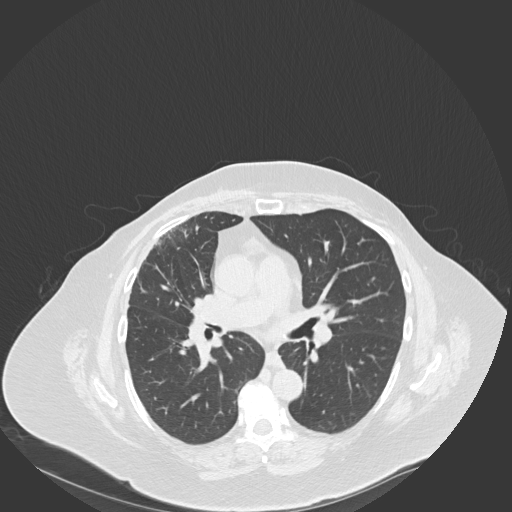
[im 108/151  lung]
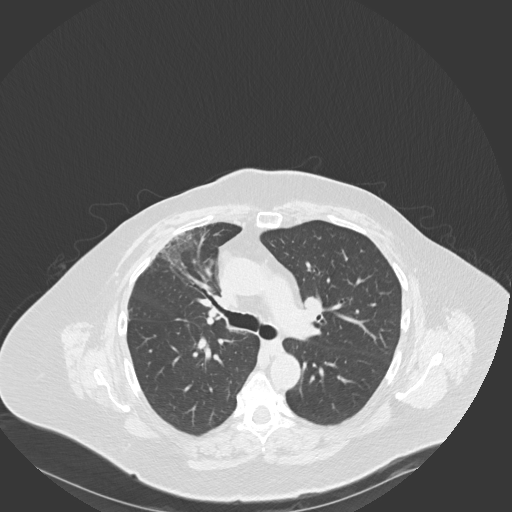
[im 129/151  mediastinal]
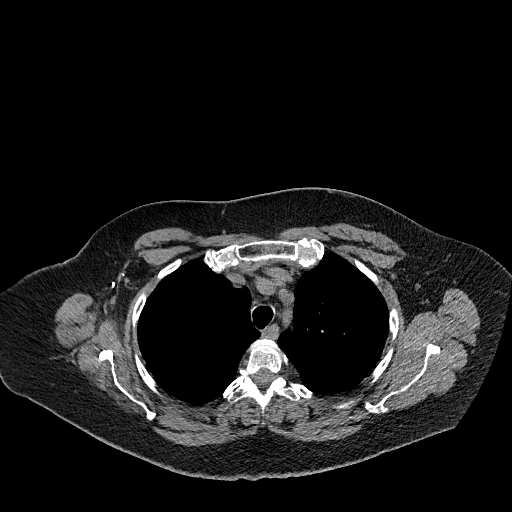
[im 129/151  lung]
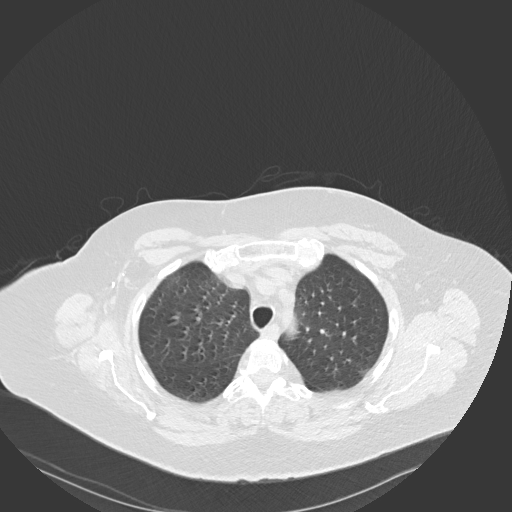
[im 140/151  lung]
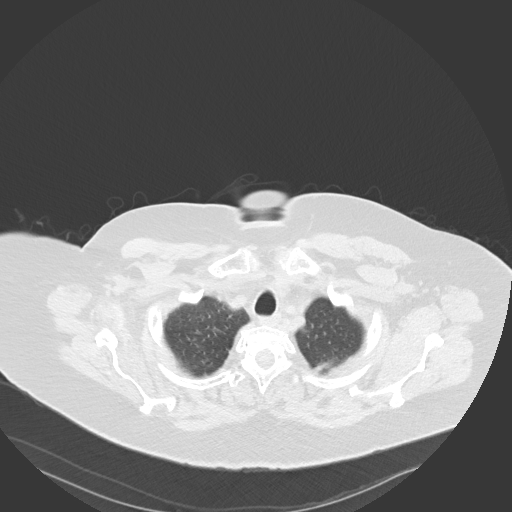

[Series 6: coronal · coronal · 0.70mm/px · 3 of 142 slices shown]
[im 29/142  lung]
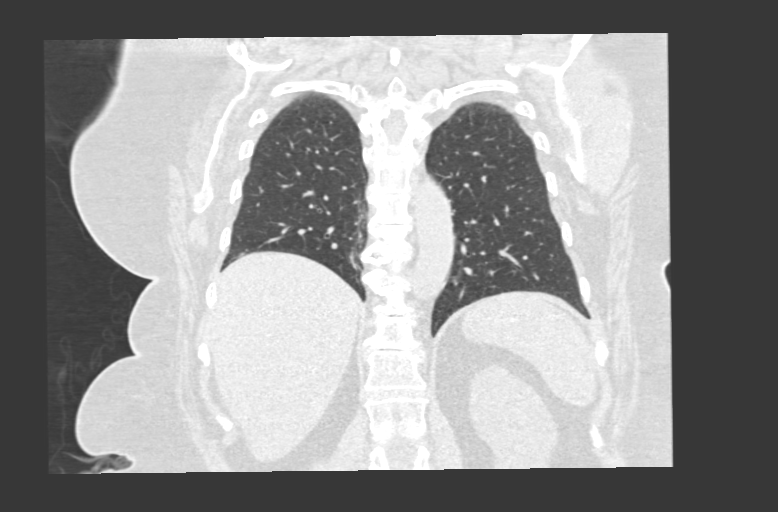
[im 57/142  lung]
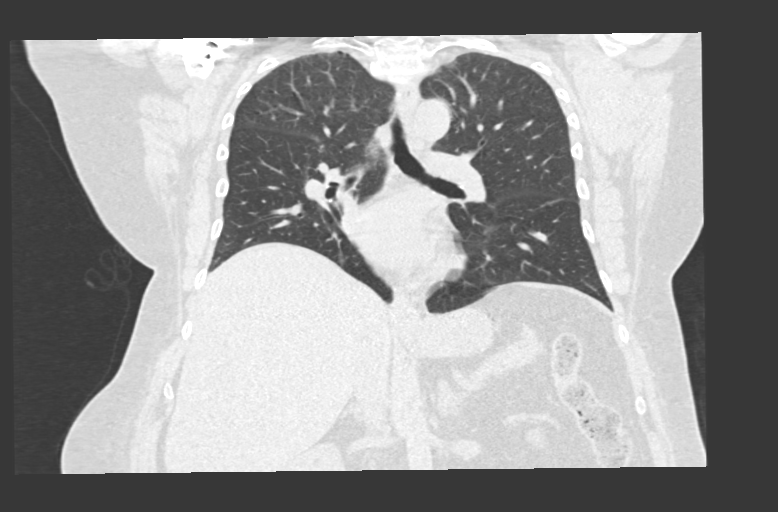
[im 85/142  lung]
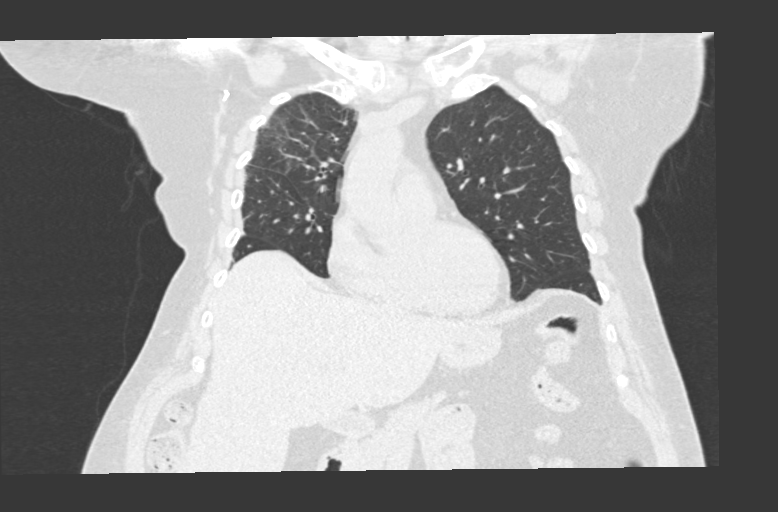

[13 of 36 positions shown; findings below may reference images not displayed]

FINDINGS: Cardiovascular: The cardiac size is normal. There are trace
calcifications in the LAD coronary artery, minimal pericardial
effusion. The pulmonary arteries and veins are normal in caliber.
There is atherosclerosis in the aortic arch without aneurysm.
Minimal calcification in the great vessels. Descending aortic
segment tortuosity.

Mediastinum/Nodes: Thyroid gland not optimally seen due to spray
artifact from a right shoulder replacement. There is no obvious
thyroid nodule or axillary mass.

There are surgical clips in the right axilla. Previously there was
an elliptical right axillary/chest wall fluid collection
subcutaneous linear scar-like opacity in its previous location.

No intrathoracic adenopathy is seen without contrast, no tracheal
filling defects. Small hiatal hernia with the thoracic esophagus
otherwise unremarkable.

Lungs/Pleura: There is no pleural effusion, thickening or
pneumothorax. There is again noted mild elevation of the right
hemidiaphragm.

Anteriorly in the right upper lobe there is ground-glass opacity
extending to the periphery with underlying interstitial thickening,
extending inferiorly to a lesser extent into the underlying right
middle lobe.

This is probably radiation fibrosis related to XRT. More superiorly
in the right upper lobe there are 2 adjacent new 4 mm nodules on
series 5 axial images 17 and 18.

In the posterolateral left lower lobe on [DATE], there is a stable 7 x
4 mm pleural-based noncalcified nodule. Laterally in the right lower
lobe there is a stable 6 mm nodule on [DATE]. Stability over 3 years
is consistent with a benign process.

In the lingular base there are coarse linear markings increased from
the prior study most likely due to scarring.

The central airways, remaining lungs are clear.

Upper Abdomen: No acute abnormality. Mild hepatic
steatosis.Gallbladder is absent without biliary dilatation.

Musculoskeletal: There is osteopenia, thoracic kyphosis and
degenerative changes. No destructive bone lesion is seen. The
ribcage is intact.

There is an old right mastectomy, and interval left mastectomy as
well as interval right shoulder reverse arthroplasty insertion.
IMPRESSION: 1. New ground-glass disease with underlying interstitial thickening
in the anterior aspect of the right upper lobe extending into the
underlying right middle lobe. This is most likely chronic
ground-glass disease related to XRT. Active pneumonitis is possible
in the appropriate clinical setting.
2. 2 adjacent 4 mm nodules in the right upper lobe are new. Given
the patient is high risk for malignancy, a non-contrast Chest CT at
12 months is recommended. If performed and the nodule is stable at
12 months, no further follow-up is recommended. These guidelines do
not apply to immunocompromised patients and patients with cancer.
Follow up in patients with significant comorbidities as clinically
warranted. For lung cancer screening, adhere to Lung-RADS
guidelines. Reference: Radiology. 0260; 284(1):228-43.
3. Stable nodules with 1 in each lower lobe.
4. Aortic and coronary artery atherosclerosis.
5. Mild hepatic steatosis.
6. Left mastectomy new from December 2018. Also new right shoulder
arthroplasty. Old right mastectomy.

## 2023-12-06 ENCOUNTER — Ambulatory Visit (HOSPITAL_COMMUNITY): Admitting: Licensed Clinical Social Worker

## 2023-12-11 ENCOUNTER — Telehealth (HOSPITAL_COMMUNITY): Payer: Medicare PPO | Admitting: Psychiatry

## 2023-12-15 ENCOUNTER — Other Ambulatory Visit (HOSPITAL_COMMUNITY): Payer: Self-pay | Admitting: Psychiatry

## 2023-12-15 DIAGNOSIS — F331 Major depressive disorder, recurrent, moderate: Secondary | ICD-10-CM

## 2023-12-26 ENCOUNTER — Encounter (HOSPITAL_COMMUNITY): Payer: Self-pay | Admitting: Licensed Clinical Social Worker

## 2023-12-26 ENCOUNTER — Ambulatory Visit (INDEPENDENT_AMBULATORY_CARE_PROVIDER_SITE_OTHER): Admitting: Licensed Clinical Social Worker

## 2023-12-26 DIAGNOSIS — F331 Major depressive disorder, recurrent, moderate: Secondary | ICD-10-CM

## 2023-12-26 NOTE — Progress Notes (Signed)
 Virtual Visit via Video Note  I connected with Alice Bowers on 12/26/23 at  9:00 AM EDT by a video enabled telemedicine application and verified that I am speaking with the correct person using two identifiers.  Location: Patient: home Provider: home office   I discussed the limitations of evaluation and management by telemedicine and the availability of in person appointments. The patient expressed understanding and agreed to proceed.   I discussed the assessment and treatment plan with the patient. The patient was provided an opportunity to ask questions and all were answered. The patient agreed with the plan and demonstrated an understanding of the instructions.   The patient was advised to call back or seek an in-person evaluation if the symptoms worsen or if the condition fails to improve as anticipated.  I provided 55 minutes of non-face-to-face time during this encounter.   Alice Dago, LCSW   THERAPIST PROGRESS NOTE  Session Time: 9:00am-9:55am  Participation Level: Active  Behavioral Response: CasualAlertDepressed  Type of Therapy: Individual Therapy  Treatment Goals addressed: Reduce frequency, intensity, and duration of depression symptoms so that daily functioning is improved. Alice Bowers will identify cognitive patterns and beliefs that support depression   ProgressTowards Goals: Not Progressing  Interventions: Motivational Interviewing  Summary: Alice Bowers is a 73 y.o. female who presents with MDD, recurrent, moderate.   Suicidal/Homicidal: Nowithout intent/plan  Therapist Response: Alice Bowers engaged well in individual virtual session with Facilities manager. Clinician utilized MI OARS to reflect and summarize thoughts and feelings. Clinician identified increased depression and hopelessness due to financial struggles. Clinician explored the specific challenges, as well as existential frustrations about where she is in this time of her life. Clinician processed  options, as well as validated feelings. Clinician explored physical health briefly and noted ongoing health concerns.   Plan: Return again in 4 weeks.  Diagnosis: Major depressive disorder, recurrent episode, moderate (HCC)  Collaboration of Care: Psychiatrist AEB Alice Bowers will see Dr. Carlos Bowers in 2 weeks. Clinician reminded of appt and encouraged her to communicate about depression sxs.  Patient/Guardian was advised Release of Information must be obtained prior to any record release in order to collaborate their care with an outside provider. Patient/Guardian was advised if they have not already done so to contact the registration department to sign all necessary forms in order for us  to release information regarding their care.   Consent: Patient/Guardian gives verbal consent for treatment and assignment of benefits for services provided during this visit. Patient/Guardian expressed understanding and agreed to proceed.   Alice [place], LCSW 12/26/2023

## 2023-12-30 ENCOUNTER — Other Ambulatory Visit (HOSPITAL_COMMUNITY): Payer: Self-pay | Admitting: Psychiatry

## 2023-12-30 DIAGNOSIS — F331 Major depressive disorder, recurrent, moderate: Secondary | ICD-10-CM

## 2023-12-30 DIAGNOSIS — F419 Anxiety disorder, unspecified: Secondary | ICD-10-CM

## 2024-01-09 ENCOUNTER — Encounter (HOSPITAL_COMMUNITY): Payer: Self-pay | Admitting: Psychiatry

## 2024-01-09 ENCOUNTER — Telehealth (HOSPITAL_BASED_OUTPATIENT_CLINIC_OR_DEPARTMENT_OTHER): Admitting: Psychiatry

## 2024-01-09 DIAGNOSIS — F419 Anxiety disorder, unspecified: Secondary | ICD-10-CM

## 2024-01-09 DIAGNOSIS — F331 Major depressive disorder, recurrent, moderate: Secondary | ICD-10-CM | POA: Diagnosis not present

## 2024-01-09 MED ORDER — BUPROPION HCL ER (SR) 150 MG PO TB12: 150.0000 mg | ORAL_TABLET | Freq: Two times a day (BID) | ORAL | 2 refills | Status: DC

## 2024-01-09 MED ORDER — LAMOTRIGINE 150 MG PO TABS: 150.0000 mg | ORAL_TABLET | Freq: Every day | ORAL | 2 refills | Status: DC

## 2024-01-09 MED ORDER — ESCITALOPRAM OXALATE 20 MG PO TABS: 20.0000 mg | ORAL_TABLET | Freq: Every day | ORAL | 2 refills | Status: DC

## 2024-01-09 NOTE — Progress Notes (Signed)
 [facility] MD Virtual Progress Note   Patient Location: Home Provider Location: Home Office  I connect with patient by telephone and verified that I am speaking with correct person by using two identifiers. I discussed the limitations of evaluation and management by telemedicine and the availability of in person appointments. I also discussed with the patient that there may be a patient responsible charge related to this service. The patient expressed understanding and agreed to proceed.  Alice Bowers [id number] 73 y.o.  01/09/2024 9:42 AM  History of Present Illness:  Patient is evaluated by telephone because her video is not working.  She reported dysphoria because not able to make enough money on her current job.  She had applied many places and she feels she is qualified but due to her age she is discriminated.  She is sleeping on and off.  She is taking modafinil which helps her attention and focus but also admitted sometime does not let her sleep at night.  She denies any panic attack but overall she feels sometimes ruminative thoughts about her finances.  She is in therapy with Camilo Cella.  She tried to see her granddaughter when she can as she is also in the school.  Her appetite is okay.  Her weight is stable.  She has no rash, itching, tremors or shakes.  She is taking the Lamictal  150, Wellbutrin  SR 150 mg twice a day and Lexapro  20 mg daily.  She has no concern about the medication.  Her appetite is fair.  Her weight is stable as she tried to watch her calorie intake and usually eat once a day.    Past Psychiatric History: H/O depression since 25 when going through divorce.  H/O 4 inpatient treatment. H/O suicidal attempt by taking overdose on Seroquel.  No h/o paranoia, psychosis or aggressive behavior.  Tried Paxil, Zoloft, Remeron (weight gain) and Seroquel with limited response.    Outpatient Encounter Medications as of 01/09/2024  Medication Sig   Albuterol   Sulfate 108 (90 Base) MCG/ACT AEPB Inhale 1-2 puffs into the lungs 4 (four) times daily as needed (Asthma/Wheezing/ Shortness of breath).   amLODipine (NORVASC) 2.5 MG tablet Take 2.5 mg by mouth daily.    anastrozole (ARIMIDEX) 1 MG tablet Take 1 mg by mouth daily.   atorvastatin (LIPITOR) 40 MG tablet Take 40 mg by mouth daily.   buPROPion  (WELLBUTRIN  SR) 150 MG 12 hr tablet Take 1 tablet (150 mg total) by mouth 2 (two) times daily.   calcium-vitamin D 250-100 MG-UNIT tablet Take 1 tablet by mouth daily.   ELIQUIS 5 MG TABS tablet Take by mouth.   escitalopram  (LEXAPRO ) 20 MG tablet Take 1 tablet (20 mg total) by mouth daily.   fluticasone (FLONASE) 50 MCG/ACT nasal spray Place 1 spray into both nostrils daily as needed for allergies or rhinitis.   hydrochlorothiazide (HYDRODIURIL) 25 MG tablet Take 25 mg by mouth daily.   ipratropium-albuterol  (DUONEB) 0.5-2.5 (3) MG/3ML SOLN Take 3 mLs by nebulization every 6 (six) hours as needed (asthma).   lamoTRIgine  (LAMICTAL ) 150 MG tablet Take 1 tablet (150 mg total) by mouth daily.   losartan (COZAAR) 100 MG tablet Take 100 mg by mouth daily.   modafinil (PROVIGIL) 200 MG tablet Take 200 mg by mouth every morning.   Multiple Vitamin (MULTIVITAMIN WITH MINERALS) TABS tablet Take 1 tablet by mouth daily.   pramipexole (MIRAPEX) 0.5 MG tablet Take 0.5 mg by mouth in the morning and at bedtime.   TRELEGY ELLIPTA  200-62.5-25 MCG/ACT AEPB Take 1 puff by mouth daily.   [DISCONTINUED] allopurinol (ZYLOPRIM) 300 MG tablet Take 300 mg by mouth daily.   No facility-administered encounter medications on file as of 01/09/2024.    No results found for this or any previous visit (from the past 2160 hours).   Psychiatric Specialty Exam: Physical Exam  Review of Systems  Weight 235 lb (106.6 kg).There is no height or weight on file to calculate BMI.  General Appearance: NA  Eye Contact:  NA  Speech:  Slow  Volume:  Decreased  Mood:  Dysphoric  Affect:  NA   Thought Process:  Descriptions of Associations: Intact  Orientation:  Full (Time, Place, and Person)  Thought Content:  Rumination  Suicidal Thoughts:  No  Homicidal Thoughts:  No  Memory:  Immediate;   Good Recent;   Good Remote;   Good  Judgement:  Intact  Insight:  Present  Psychomotor Activity:  NA  Concentration:  Concentration: Good and Attention Span: Good  Recall:  Good  Fund of Knowledge:  Good  Language:  Good  Akathisia:  No  Handed:  Right  AIMS (if indicated):     Assets:  Communication Skills Desire for Improvement Housing Transportation  ADL's:  Intact  Cognition:  WNL  Sleep:  fair       09/10/2020    6:32 PM  Depression screen PHQ 2/9  Decreased Interest 1  Down, Depressed, Hopeless 1  PHQ - 2 Score 2  Altered sleeping 1  Tired, decreased energy 2  Change in appetite 1  Feeling bad or failure about yourself  0  Trouble concentrating 0  Moving slowly or fidgety/restless 0  Suicidal thoughts 0  PHQ-9 Score 6  Difficult doing work/chores Somewhat difficult    Assessment/Plan: Major depressive disorder, recurrent episode, moderate (HCC) - Plan: lamoTRIgine  (LAMICTAL ) 150 MG tablet, escitalopram  (LEXAPRO ) 20 MG tablet, buPROPion  (WELLBUTRIN  SR) 150 MG 12 hr tablet  Anxiety - Plan: escitalopram  (LEXAPRO ) 20 MG tablet  Discussed her job situation and finances.  She is actively looking for a better job because current job does not have enough to pay the bills.  Encouraged to continue therapy with Camilo Cella.  Continue Wellbutrin  SR 150 mg twice a day, Lamictal  150 mg daily and Lexapro  20 mg daily.  Patient told she was given the new prescription of Lamictal  to take twice a day but she is taking only 150 which is recommended to the patient.  I recommend inform the pharmacy and disregard the previous prescription and pick up the new 1 to take 150 a day.  Recommended to call us  back if she is any question or any concern.  Follow-up in 3 months.   Follow Up  Instructions:     I discussed the assessment and treatment plan with the patient. The patient was provided an opportunity to ask questions and all were answered. The patient agreed with the plan and demonstrated an understanding of the instructions.   The patient was advised to call back or seek an in-person evaluation if the symptoms worsen or if the condition fails to improve as anticipated.    Collaboration of Care: Other provider involved in patient's care AEB notes are available in epic to review  Patient/Guardian was advised Release of Information must be obtained prior to any record release in order to collaborate their care with an outside provider. Patient/Guardian was advised if they have not already done so to contact the registration department to sign all necessary  forms in order for us  to release information regarding their care.   Consent: Patient/Guardian gives verbal consent for treatment and assignment of benefits for services provided during this visit. Patient/Guardian expressed understanding and agreed to proceed.     Total encounter time 19 minutes which includes face-to-face time, chart reviewed, care coordination, order entry and documentation during this encounter.   Note: This document was prepared by [employer] voice dictation technology and any errors that results from this process are unintentional.    Arturo Late, MD 01/09/2024

## 2024-01-24 ENCOUNTER — Ambulatory Visit (INDEPENDENT_AMBULATORY_CARE_PROVIDER_SITE_OTHER): Admitting: Licensed Clinical Social Worker

## 2024-01-24 DIAGNOSIS — F331 Major depressive disorder, recurrent, moderate: Secondary | ICD-10-CM

## 2024-01-26 ENCOUNTER — Encounter (HOSPITAL_COMMUNITY): Payer: Self-pay | Admitting: Licensed Clinical Social Worker

## 2024-01-26 NOTE — Progress Notes (Signed)
 Virtual Visit via Video Note  I connected with Alice Bowers on 01/24/24 at  9:00 AM EDT by a video enabled telemedicine application and verified that I am speaking with the correct person using two identifiers.  Location: Patient: Home Provider: Home office    I discussed the assessment and treatment plan with the patient. The patient was provided an opportunity to ask questions and all were answered. The patient agreed with the plan and demonstrated an understanding of the instructions.   The patient was advised to call back or seek an in-person evaluation if the symptoms worsen or if the condition fails to improve as anticipated.  I provided 45 minutes of non-face-to-face time during this encounter.   Alice Dago, LCSW   THERAPIST PROGRESS NOTE  Session Time: 9:00am-9:45am  Participation Level: Active  Behavioral Response: NeatAlertDepressed  Type of Therapy: Individual Therapy  Treatment Goals addressed: Reduce frequency, intensity, and duration of depression symptoms so that daily functioning is improved. Ahonesty will identify cognitive patterns and beliefs that support depression   ProgressTowards Goals: Progressing  Interventions: Motivational Interviewing  Summary: Alice Bowers is a 73 y.o. female who presents with MDD, recurrent, moderate.   Suicidal/Homicidal: Nowithout intent/plan  Therapist Response: Alice Bowers engaged well in individual virtual session with Facilities manager.  Clinician utilized MI OARS to process thoughts, feelings, and interactions.  Clinician reflected surprise and sadness over the recent death of younger brother.  Clinician explored grief process and provided time and space for Alice Bowers to share her feelings.  Clinician explored relationships with siblings and noted that brother's death was not expected.  Clinician processed sense of loneliness.  Clinician also discussed stress related to finances and continuing to work Facilities manager.  Clinician  explored alternatives to working such as Risk manager housing.  Alice Bowers shared challenges in getting son-in-law to rent a property to her.  Clinician provided support and processed ways to convince him to help her out.  Plan: Return again in 4 weeks.  Diagnosis: Major depressive disorder, recurrent episode, moderate (HCC)  Collaboration of Care: Patient refused AEB none required  Patient/Guardian was advised Release of Information must be obtained prior to any record release in order to collaborate their care with an outside provider. Patient/Guardian was advised if they have not already done so to contact the registration department to sign all necessary forms in order for us  to release information regarding their care.   Consent: Patient/Guardian gives verbal consent for treatment and assignment of benefits for services provided during this visit. Patient/Guardian expressed understanding and agreed to proceed.   Alice Stare [place], LCSW 01/26/2024

## 2024-02-11 ENCOUNTER — Other Ambulatory Visit (HOSPITAL_COMMUNITY): Payer: Self-pay | Admitting: Psychiatry

## 2024-02-11 DIAGNOSIS — F419 Anxiety disorder, unspecified: Secondary | ICD-10-CM

## 2024-02-11 DIAGNOSIS — F331 Major depressive disorder, recurrent, moderate: Secondary | ICD-10-CM

## 2024-02-15 ENCOUNTER — Encounter (HOSPITAL_COMMUNITY): Payer: Self-pay

## 2024-02-15 ENCOUNTER — Ambulatory Visit (HOSPITAL_COMMUNITY): Admitting: Licensed Clinical Social Worker

## 2024-03-26 ENCOUNTER — Ambulatory Visit (HOSPITAL_COMMUNITY): Admitting: Licensed Clinical Social Worker

## 2024-03-26 ENCOUNTER — Encounter (HOSPITAL_COMMUNITY): Payer: Self-pay | Admitting: Licensed Clinical Social Worker

## 2024-03-26 DIAGNOSIS — F331 Major depressive disorder, recurrent, moderate: Secondary | ICD-10-CM | POA: Diagnosis not present

## 2024-03-26 NOTE — Progress Notes (Signed)
 Virtual Visit via Video Note  I connected with Letzy Gullickson on 03/26/24 at  9:00 AM EDT by a video enabled telemedicine application and verified that I am speaking with the correct person using two identifiers.  Location: Patient: home Provider: office   I discussed the limitations of evaluation and management by telemedicine and the availability of in person appointments. The patient expressed understanding and agreed to proceed.   I discussed the assessment and treatment plan with the patient. The patient was provided an opportunity to ask questions and all were answered. The patient agreed with the plan and demonstrated an understanding of the instructions.   The patient was advised to call back or seek an in-person evaluation if the symptoms worsen or if the condition fails to improve as anticipated.  I provided 45 minutes of non-face-to-face time during this encounter.   Harlene JONELLE Rosser, LCSW   THERAPIST PROGRESS NOTE  Session Time: 9:10-9:55am  Participation Level: Active  Behavioral Response: NeatAlertAnxious and Depressed  Type of Therapy: Individual Therapy  Treatment Goals addressed:  Reduce frequency, intensity, and duration of depression symptoms so that daily functioning is improved. Kenzee will identify cognitive patterns and beliefs that support depression     ProgressTowards Goals: Progressing  Interventions: CBT  Summary: Jamiesha [name] is a 73 y.o. female who presents with MDD, recurrent, moderate.   Suicidal/Homicidal: Nowithout intent/plan  Therapist Response: Deja engaged well in individual virtual session with Facilities manager. Clinician utilized CBT to process thoughts, feelings, and interactions. 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 Clinician processed experience so far, including concerns about her abilities to complete tasks. Clinician normalized thoughts and feelings about being a slow learner. Clinician also  reflected the shoulds that are occurring in Sumayyah's thought processes. Clinician explored prior knowledge and experience that has been and will be helpful. Clinician reflected that the challenges lie in setting up machinery which is new to her. Clinician explored the training experience and noted that there are several cheat sheets available to help her learn the processes. Clinician also noted that she is only 3 weeks in to a 12 week training program.  Margo responded well to the reality testing and reported feeling more confident in her current and budding abilities so far.   Plan: Return again in 3 weeks.  Diagnosis: Major depressive disorder, recurrent episode, moderate (HCC)  Collaboration of Care: Patient refused AEB none required  Patient/Guardian was advised Release of Information must be obtained prior to any record release in order to collaborate their care with an outside provider. Patient/Guardian was advised if they have not already done so to contact the registration department to sign all necessary forms in order for us  to release information regarding their care.   Consent: Patient/Guardian gives verbal consent for treatment and assignment of benefits for services provided during this visit. Patient/Guardian expressed understanding and agreed to proceed.   Harlene JONELLE [place], LCSW 03/26/2024

## 2024-03-28 NOTE — ED Provider Notes (Signed)
 ------------------------------------------------------------------------------- Attestation signed by Ludie Primmer, DO at 03/30/24 1830 I attest that I was the Attending Physician in the Emergency Department during this patient's visit. The patient was seen and managed by the Advanced Practice Clinician independently. I did not personally evaluate the patient, though was present and immediately available for consultation as needed. I have reviewed the chart.   Ludie Primmer, D.O.  -------------------------------------------------------------------------------   [facility]  ED Provider Note  Phyllis Abelson 73 y.o. female DOB: [birth year] MRN: [id number] History   Chief Complaint  Patient presents with  . Leg Swelling    LEFT, pt noticed swelling today, pt has had pain in her knee and behind her knee x 2 weeks and one week ago had an episode of shob. Sent by PCP to r/o blood clot   HPI History of Present Illness This is a patient with a history of breast cancer, bilateral pulmonary emboli, and immunodeficiency presenting with left leg swelling.  The patient reports that her left leg has been swollen before, but she just noticed significant swelling this morning while at the office. She also mentions experiencing pain in her knee, initially attributing it to her prosthesis, but now believes the discomfort is originating from her proximal calf. The pain in the calf is not associated with any recent injury. She has a history of bilateral pulmonary emboli and was prescribed Eliquis as a blood thinner. However, due to financial constraints, she has been unable to afford the medication and has been off it for most of the week. About a week ago, she experienced sudden shortness of breath, which led to a panic attack. Currently, she reports feeling slightly short of breath, which is not typical for her.   She has a history of breast cancer and underwent chemotherapy  and radiation therapy. She is currently under the care of Dr. Carlin for immunodeficiency and receives IVIG every 2 to 3 months. She also has asthma.    Past Medical History:  Diagnosis Date  . Allergy 1976  . Anxiety 11/95  . Asthma (*) 1976  . Carpal tunnel syndrome    bilateral  . COPD (chronic obstructive pulmonary disease) (*) 1976  . Excessive daytime sleepiness   . Heart murmur 2008  . History of tachycardia 02/13/2014  . HTN (hypertension)   . Hx of Clostridium difficile infection    pt stated many years ago  . Hyperlipidemia   . IgG deficiency (*)   . Major depressive disorder 1995  . Malignant neoplasm of lower-outer quadrant of right female breast, unspecified estrogen receptor status (*)   . Nephrolithiasis   . Obesity   . OSA on CPAP   . Osteoarthritis   . RLS (restless legs syndrome)   . Vitamin D deficiency 02/13/2014    Past Surgical History:  Procedure Laterality Date  . Appendectomy  1991  . Breast biopsy Right 2001   Stereotatic biopsy  . Breast biopsy Right 04/26/2018  . Colonoscopy N/A 2012-2013   Normal   . Dilation and curettage of uterus  1982  . Fracture surgery  2013   Fx. Rt Ankle  . Hysterectomy  1991   with Burch Procedure  . Joint replacement  2008   Both knees  . Laparoscopic cholecystectomy  1991  . Lipoma resection Right    forearm  . Lymph node biopsy  04/2018  . Mastectomy Left 04/15/2019   Prophylactic Mastectomy/Dr.Cimpeam  . Multiple tooth extractions    . Orif ankle dislocation  Right 2013  . Reverse total shoulder arthroplasty Right 10/14/2021   at [facility] hospital  . Shoulder arthroscopy Left 1997  . Steriostatic biopsy Right 2001   breast  . Thumb fusion Bilateral   . Toe surgery  08/2023  . Total knee arthroplasty Bilateral 2008  . Wrist surgery Left     Social History   Substance and Sexual Activity  Alcohol Use Yes   Comment: OCC/Social-1 Glass of Wine a Month   Tobacco Use History[1] E-Cigarettes  .  Vaping Use Former Neurosurgeon   . Start Date    . Cartridges/Day    . Quit Date     Social History   Substance and Sexual Activity  Drug Use Never         Allergies[2]  Discharge Medication List as of 03/28/2024  3:57 PM     CONTINUE these medications which have NOT CHANGED   Details  albuterol  sulfate HFA (PROVENTIL ,VENTOLIN ,PROAIR ) 108 (90 Base) MCG/ACT inhaler INHALE 2 PUFFS EVERY 6 HR,AS NEEDED FOR SHORTNESS OF BREATH OR WHEEZING, Normal    amLODIPine besylate (NORVASC) 2.5 mg tablet TAKE ONE TABLET (2.5 MG DOSE) BY MOUTH DAILY., Starting Tue 12/08/2020, Normal    anastrozole (ARIMIDEX) 1 MG tablet TAKE 1 TABLET BY MOUTH EVERY DAY, Starting Wed 01/25/2023, Normal    atorvastatin (LIPITOR) 40 mg tablet TAKE 1 TABLET BY MOUTH EVERY DAY, Normal    buPROPion  (WELLBUTRIN  SR) 150 mg 12 hr tablet Take one tablet (150 mg dose) by mouth 2 (two) times daily., Historical Med    calcium-vitamin D 250-100 MG-UNIT per tablet Take one tablet by mouth 2 (two) times daily for 30 days., Starting Mon 03/11/2019, Until Tue 10/12/2021, Normal    Cyanocobalamin (VITAMIN B-12 PO) Take by mouth., Starting Thu 03/23/2023, Historical Med    diclofenac sodium (VOLTAREN) 1 % gel Place onto the skin 4 (four) times a day as needed., Starting Thu 11/28/2019, Print    ELIQUIS 5 MG tablet Take one tablet (5 mg dose) by mouth 2 (two) times daily., Starting Tue 03/19/2024, Historical Med    escitalopram  oxalate (LEXAPRO ) 20 mg tablet Take one tablet (20 mg dose) by mouth every morning., Historical Med    fluticasone-umeclidin-vilant (TRELEGY ELLIPTA) 200-62.5-25 mcg/act AEPB inhaler Inhale one puff into the lungs daily., Starting Wed 10/25/2023, Normal    gabapentin (NEURONTIN) 300 mg capsule TAKE 1 CAPSULE BY MOUTH THREE TIMES A DAY, Normal    hydrochlorothiazide (HYDRODIURIL) 25 mg tablet TAKE 1 TABLET BY MOUTH EVERY DAY, Normal    immune globulin, Human, 40 g /400 mL SOLN Inject into the vein as needed., Historical  Med    lamotrigine  (LAMICTAL ) 150 mg tablet Take one tablet (150 mg dose) by mouth at bedtime., Starting Wed 07/18/2018, Historical Med    losartan potassium (COZAAR) 100 mg tablet TAKE 1 TABLET BY MOUTH EVERY DAY, Normal    Mag Threonate-Niacinamide ER (MAG-AMIDE) 500-250 MG TBCR Magnesium, 0 Refill(s), 113.7, Historical Med    Misc. Devices MISC New nebulizer supplies and tubing. Has asthma. Send to [employer]., Print    modafinil (PROVIGIL) 200 mg tablet Take one tablet (200 mg dose) by mouth daily., Starting Mon 06/08/2020, Historical Med    Multiple Vitamins-Minerals (PRESERVISION AREDS 2+MULTI VIT PO) Starting Mon 12/13/2021, Historical Med    polyvinyl alcohol (LIQUIFILM TEARS) 1.4% ophthalmic solution Place one drop into both eyes daily as needed., Historical Med    pramipexole (MIRAPEX) 0.5 MG tablet TAKE 1 TABLET BY MOUTH TWICE A DAY, Normal  Primary Survey  Primary Survey  Review of Systems   Review of Systems  Constitutional:  Negative for chills and fever.  Respiratory:  Positive for shortness of breath (mild).   Cardiovascular:  Positive for leg swelling (left). Negative for chest pain.  Gastrointestinal:  Negative for abdominal pain.    Physical Exam   ED Triage Vitals [03/28/24 1146]  BP (!) 139/103  Heart Rate 73  Resp 18  SpO2 96 %  Temp 97.7 F (36.5 C)    Physical Exam Physical Exam Respiratory: Lungs are clear to auscultation bilaterally. No wheezing noted. Gastrointestinal: Abdomen is nontender. Musculoskeletal: Mild left lower leg swelling.  Mild proximal calf tenderness.  Dorsal pedal pulse intact no tenderness on palpation of the bone. Good range of motion of the ankle and knee.  ED Course   Lab results:   CBC AND DIFFERENTIAL - Abnormal      Result Value   WBC 5.4     RBC 4.12     HGB 12.7     HCT 37.7     MCV 91.5     MCH 30.8     MCHC 33.7     Plt Ct 253     RDW SD 47.1 (*)    MPV 9.9     NRBC% 0.0     Absolute NRBC Count  0.00     NEUTROPHIL % 61.6     LYMPHOCYTE % 22.6     MONOCYTE % 10.0     Eosinophil % 4.1     BASOPHIL % 1.3     IG% 0.4     ABSOLUTE NEUTROPHIL COUNT 3.34     ABSOLUTE LYMPHOCYTE COUNT 1.22     Absolute Monocyte Count 0.54     Absolute Eosinophil Count 0.22     Absolute Basophil Count 0.07     Absolute Immature Granulocyte Count 0.02    COMPREHENSIVE METABOLIC PANEL - Normal   Na 140     Potassium 4.0     Cl 105     CO2 24     AGAP 11     Glucose 96     BUN 12     Creatinine 0.66     Ca 9.5     ALK PHOS 136     T Bili 0.4     Total Protein 6.1     Alb 3.9     GLOBULIN 2.2     ALBUMIN/GLOBULIN RATIO 1.8     BUN/CREAT RATIO 18.2     ALT 14     AST 22     eGFR 93     Comment: Normal GFR (glomerular filtration rate) > 60 mL/min/1.73 meters squared, < 60 may include impaired kidney function. Calculation based on the Chronic Kidney Disease Epidemiology Collaboration (CK-EPI)equation refit without adjustment for race.  NT-PROBNP - Normal   NT-ProBNP 236     Comment: Among patients with dyspnea, NT-proBNPis highly sensitive for the detection of acute congestive heart failure.  In addition, a NT-proBNP <300 pg/mL effectively rules out acute congestive heart failure, with 98% negative predictive value.     Imaging:   US  VENOUS LOWER EXTREMITY LEFT   Narrative:    TECHNIQUE: The veins of the left lower extremity were interrogated from the visible common femoral vein to the distal popliteal vein.  The junction of the greater saphenous with the common femoral vein as well as the posterior tibial veins were evaluated.  Gray scale, color, spectral, and doppler sonography  was utilized and analyzed. COMPARISON: None.   INDICATION: Leg deep vein thrombosis (DVT) suspected    FINDINGS: Flow is present in all interrogated vessels and there are no internal echoes.  Normal venous compressibility is demonstrated and there is augmentation of flow with appropriate maneuvers.     INCIDENTAL FINDINGS:  There is an hypoechoic, somewhat poorly defined area in the posterior fossa that measures approximately 3.6 x 2.2 x 1.7 cm consistent with a popliteal cyst.    Impression:    IMPRESSION:   1. No evidence of deep venous thrombosis.  2. Left popliteal cyst.  Electronically Signed by: Franky Cheshire MD, PHD on 03/28/2024 3:18 PM  XR CHEST AP PORTABLE   Narrative:    TECHNIQUE: XR CHEST AP PORTABLE  INDICATION: Shortness of breath   COMPARISON: March 10, 2022  FINDINGS: Left subclavian port catheter is unchanged. Stable cardiac and mediastinal contours. No pleural effusion or pneumothorax. Lobe lung volumes with bronchovascular crowding. No focal consolidation. Right shoulder reverse arthroplasty.    Impression:    IMPRESSION: No definite acute abnormality.   Electronically Signed by: Ozell JONETTA Cordial, MD on 03/28/2024 1:54 PM  CT ANGIO CHEST PULMONARY   Narrative:    CT PULMONARY ANGIOGRAM  INDICATION: Shortness of breath.   COMPARISON:  Same day chest x-ray. Chest CTA March 06, 2023.  TECHNIQUE:  Routine CT pulmonary angiogram with contrast was performed after the intravenous administration of  90 mL of Isovue 370 iodinated contrast.  Image post processing was performed creating multi planar 2D and angiographic 3D MIP, shaded surface rendering or 3D volume rendering images for review and comparison. Radiation dose reduction was utilized (automated exposure control, mA or kV adjustment based on patient size, or iterative image reconstruction).  FINDINGS:  - Pulmonary Arteries: There is no evidence of pulmonary embolus.  - Lower Neck: Limited evaluation due to streak artifact from right shoulder hardware and retained contrast in the left subclavian vein.    - Cardiovascular: The heart is slightly enlarged. Minimal coronary artery calcification. Main pulmonary artery is enlarged measuring 3.3 cm. Normal caliber ascending thoracic aorta. Left subclavian  port catheter in place.  - Mediastinum, Hila and Axilla: No lymphadenopathy.   - Lungs and Pleura: The central airways are patent. There is a trace right pleural effusion. No definite left pleural effusion. No pneumothorax. There is groundglass opacity within the right upper lobe which is new compared to the prior exam. There is linear and dependent atelectasis in the bilateral lower lobes. Small area of subpleural groundglass in the lateral left upper lobe. Linear lingular atelectasis.   - Upper Abdomen: There is a tiny hiatal hernia. Cholecystectomy clips.    - Musculoskeletal: Partially imaged right shoulder reverse arthroplasty. Multilevel degenerative changes in the spine.   - Miscellaneous: Bilateral mastectomy.    Impression:    IMPRESSION: 1.  No evidence of pulmonary embolus. 2.  Groundglass opacities within the right greater than left upper lobes are likely due to mild infection. 3.  Trace right pleural effusion. 4.  Enlarged main pulmonary artery which can be seen with pulmonary artery hypertension.  Electronically Signed by: Ozell JONETTA Cordial, MD on 03/28/2024 2:45 PM    Results   ECG: ECG Results   None  Pre-Sedation Procedures    Medical Decision Making Amount and/or Complexity of Data Reviewed Labs: ordered. Radiology: ordered.  Risk Prescription drug management.    Assessment & Plan Initial Assessment: Left leg swelling noticed this morning. No tenderness on the bone, good range of motion in ankle and knee. History of bilateral pulmonary emboli, off Eliquis for most of the week due to cost. Recent episode of shortness of breath, more than usual asthma symptoms. Lung sounds clear with no wheezing.  Differential Diagnosis: - Blood clot: History of bilateral pulmonary emboli, off Eliquis for a week. Plan: Order ultrasound, conduct blood work,  consider further chest imaging if clot confirmed. - Asthma exacerbation: Recent episode of shortness of breath, more than usual symptoms. Plan: Monitor lung sounds, no wheezing noted.  ED Course: - Ordered ultrasound for left leg - Conducted blood work  Negative DVT study although does show popliteal cyst.  Negative PE although evidence of mild infection.  Final Assessment: Left leg swelling noticed this morning, no bone tenderness, good range of motion. History of bilateral pulmonary emboli, off Eliquis for a week due to cost. Recent episode of shortness of breath, more than usual asthma symptoms. Lung sounds clear with no wheezing. Ultrasound and blood work ordered.  Started on antibiotics for pneumonia and referred to orthopedics.  Patient already has Eliquis ordered she also has a coupon.  She does not require further assistance.  Clinical Impression: - Left leg swelling - Bilateral pulmonary emboli - Asthma        Provider Communication  Discharge Medication List as of 03/28/2024  3:57 PM     START taking these medications   Details  azithromycin (ZITHROMAX) 250 mg tablet Take one tablet (250 mg dose) by mouth daily for 5 days. Take first 2 tablets today (now), then 1 every day for next 4 days, Starting Thu 03/28/2024, Until Tue 04/02/2024, Normal        Discharge Medication List as of 03/28/2024  3:57 PM      Discharge Medication List as of 03/28/2024  3:57 PM      Clinical Impression Final diagnoses:  Synovial cyst of left popliteal space  Pneumonia due to infectious organism, unspecified laterality, unspecified part of lung    ED Disposition     ED Disposition  Discharge   Condition  Stable   Comment  --                 Follow-up Information     April Trinda Prude, FNP.   Specialty: Nurse Practitioner Contact information: [address] [place] [address] [phone]         Call  [facility]  [place]).   Specialty: Orthopedic Surgery Contact information: [address] [place]  [address] [phone]                 Electronically signed by:       [1] Social History Tobacco Use  Smoking Status Every Day  . Current packs/day: 0.50  . Average packs/day: 0.5 packs/day for 30.5 years (15.2 ttl pk-yrs)  . Types: Cigarettes  . Start date: 10/03/1993  . Passive exposure: Current  Smokeless Tobacco Never  [2] Allergies Allergen Reactions  . Asa [Aspirin] Other    Causes bronch spaam per patient   . Edetic Acid Anaphylaxis    EDTA  . Other Other    EDTA  . Requip Anaphylaxis  . Betadine Prepstick Plus Dermatitis  . Ceclor [  Cefaclor] Rash  . Cephalosporins Rash  . Demerol Hives  . Diclofenac Sodium Other  . Keflex Rash  . Macrobid [Nitrofurantoin] Other    Wheezing   . Phenazocine Hives  . Phenazopyridine Hives  . Sulfa Antibiotics Rash   Alyce JONETTA Mater, PA-C 03/29/24 1654

## 2024-04-10 ENCOUNTER — Telehealth (HOSPITAL_BASED_OUTPATIENT_CLINIC_OR_DEPARTMENT_OTHER): Admitting: Psychiatry

## 2024-04-10 ENCOUNTER — Encounter (HOSPITAL_COMMUNITY): Payer: Self-pay | Admitting: Psychiatry

## 2024-04-10 VITALS — Wt 235.0 lb

## 2024-04-10 DIAGNOSIS — F419 Anxiety disorder, unspecified: Secondary | ICD-10-CM | POA: Diagnosis not present

## 2024-04-10 DIAGNOSIS — F331 Major depressive disorder, recurrent, moderate: Secondary | ICD-10-CM | POA: Diagnosis not present

## 2024-04-10 MED ORDER — BUPROPION HCL ER (SR) 150 MG PO TB12
150.0000 mg | ORAL_TABLET | Freq: Two times a day (BID) | ORAL | 2 refills | Status: DC
Start: 2024-04-10 — End: 2024-07-08

## 2024-04-10 MED ORDER — ESCITALOPRAM OXALATE 20 MG PO TABS
20.0000 mg | ORAL_TABLET | Freq: Every day | ORAL | 2 refills | Status: DC
Start: 2024-04-10 — End: 2024-07-08

## 2024-04-10 MED ORDER — LAMOTRIGINE 150 MG PO TABS: 150.0000 mg | ORAL_TABLET | Freq: Every day | ORAL | 2 refills | Status: DC

## 2024-04-10 NOTE — Progress Notes (Signed)
  Health MD Virtual Progress Note   Patient Location: Home Provider Location: Home Office  I connect with patient by video and verified that I am speaking with correct person by using two identifiers. I discussed the limitations of evaluation and management by telemedicine and the availability of in person appointments. I also discussed with the patient that there may be a patient responsible charge related to this service. The patient expressed understanding and agreed to proceed.  Alice Bowers [id number] 73 y.o.   04/10/2024 10:47 AM  History of Present Illness:  Patient is evaluated by video session.  She reported summer was okay and she was working.  No travels and job is busy and sometimes frustrating but manageable.  Patient told has not seen granddaughter in a while but recently granddaughter attended dirt bike tournament and did well and she is happy for her.  Patient told she was in the emergency room twice since the last visit.  She had a leg swelling and there was a concern she may have blood clot from the port which is implanted in her chest.  Likely no infection and no blood clot.  Patient gets every 2 months infusion of immunoglobulin.  She reported sleep is okay but sometimes she is tired.  She is taking modafinil which is helping her attention and focus and avoiding naps during the day.  She denies any crying spells or any feeling of hopelessness or worthlessness.  Denies any suicidal thoughts.  She is in therapy with Harlene.  She is taking Lamictal , Wellbutrin , Lexapro .  So far no tremors or shakes or any EPS.  She denies any hallucination, paranoia.  She wants to continue medication since it is working and denies any major panic attack.  Her appetite is okay.  Her weight is stable.  Past Psychiatric History: H/O depression since 21 when going through divorce.  H/O 4 inpatient treatment. H/O suicidal attempt by taking overdose on Seroquel.  No h/o paranoia,  psychosis or aggressive behavior.  Tried Paxil, Zoloft, Remeron (weight gain) and Seroquel with limited response.   Past Medical History:  Diagnosis Date   Anxiety    Arthritis    Cancer (HCC)    R breast cancer   Depression    Difficult intubation    Heart murmur    insignificant per MD per pt   High blood pressure    High cholesterol    History of kidney stones    Macular degeneration of left eye    Macular degeneration of right eye    Obstructive sleep apnea    has cpap   Restless leg syndrome    V tach (HCC)    hx of with sleep study laying on back per pt - 2016    Outpatient Encounter Medications as of 04/10/2024  Medication Sig   Albuterol  Sulfate 108 (90 Base) MCG/ACT AEPB Inhale 1-2 puffs into the lungs 4 (four) times daily as needed (Asthma/Wheezing/ Shortness of breath).   amLODipine (NORVASC) 2.5 MG tablet Take 2.5 mg by mouth daily.    anastrozole (ARIMIDEX) 1 MG tablet Take 1 mg by mouth daily.   atorvastatin (LIPITOR) 40 MG tablet Take 40 mg by mouth daily.   buPROPion  (WELLBUTRIN  SR) 150 MG 12 hr tablet Take 1 tablet (150 mg total) by mouth 2 (two) times daily.   calcium-vitamin D 250-100 MG-UNIT tablet Take 1 tablet by mouth daily.   ELIQUIS 5 MG TABS tablet Take by mouth.   escitalopram  (LEXAPRO ) 20 MG tablet  Take 1 tablet (20 mg total) by mouth daily.   fluticasone (FLONASE) 50 MCG/ACT nasal spray Place 1 spray into both nostrils daily as needed for allergies or rhinitis.   hydrochlorothiazide (HYDRODIURIL) 25 MG tablet Take 25 mg by mouth daily.   ipratropium-albuterol  (DUONEB) 0.5-2.5 (3) MG/3ML SOLN Take 3 mLs by nebulization every 6 (six) hours as needed (asthma).   lamoTRIgine  (LAMICTAL ) 150 MG tablet Take 1 tablet (150 mg total) by mouth daily.   losartan (COZAAR) 100 MG tablet Take 100 mg by mouth daily.   modafinil (PROVIGIL) 200 MG tablet Take 200 mg by mouth every morning.   Multiple Vitamin (MULTIVITAMIN WITH MINERALS) TABS tablet Take 1 tablet by  mouth daily.   pramipexole (MIRAPEX) 0.5 MG tablet Take 0.5 mg by mouth in the morning and at bedtime.   TRELEGY ELLIPTA 200-62.5-25 MCG/ACT AEPB Take 1 puff by mouth daily.   [DISCONTINUED] allopurinol (ZYLOPRIM) 300 MG tablet Take 300 mg by mouth daily.   No facility-administered encounter medications on file as of 04/10/2024.    No results found for this or any previous visit (from the past 2160 hours).   Psychiatric Specialty Exam: Physical Exam  Review of Systems  Constitutional:  Positive for fatigue.    Weight 235 lb (106.6 kg).There is no height or weight on file to calculate BMI.  General Appearance: Casual  Eye Contact:  Good  Speech:  Clear and Coherent  Volume:  Normal  Mood:  tired  Affect:  Congruent  Thought Process:  Goal Directed  Orientation:  Full (Time, Place, and Person)  Thought Content:  Logical  Suicidal Thoughts:  No  Homicidal Thoughts:  No  Memory:  Immediate;   Good Recent;   Good Remote;   Good  Judgement:  Good  Insight:  Present  Psychomotor Activity:  Normal  Concentration:  Concentration: Good and Attention Span: Good  Recall:  Good  Fund of Knowledge:  Good  Language:  Good  Akathisia:  No  Handed:  Right  AIMS (if indicated):     Assets:  Communication Skills Desire for Improvement Financial Resources/Insurance Housing Talents/Skills Transportation  ADL's:  Intact  Cognition:  WNL  Sleep: Okay       09/10/2020    6:32 PM  Depression screen PHQ 2/9  Decreased Interest 1  Down, Depressed, Hopeless 1  PHQ - 2 Score 2  Altered sleeping 1  Tired, decreased energy 2  Change in appetite 1  Feeling bad or failure about yourself  0  Trouble concentrating 0  Moving slowly or fidgety/restless 0  Suicidal thoughts 0  PHQ-9 Score 6  Difficult doing work/chores Somewhat difficult    Assessment/Plan: Major depressive disorder, recurrent episode, moderate (HCC) - Plan: buPROPion  (WELLBUTRIN  SR) 150 MG 12 hr tablet, escitalopram   (LEXAPRO ) 20 MG tablet, lamoTRIgine  (LAMICTAL ) 150 MG tablet  Anxiety - Plan: escitalopram  (LEXAPRO ) 20 MG tablet  Patient is 73 year old employed female with history of hypertension, sleep apnea, asthma, osteoarthritis, hyperlipidemia, history of breast cancer, getting immunoglobulin infusion every 2 months, major depressive disorder and anxiety.  Currently stable on medication.  I reviewed blood work results and collect information from other providers.  Labs are stable.  Patient reported frustration about her job but so far manageable.  She is not actively looking for new job.  She wants to continue current medication which is helping her mood and anxiety.  Continue Wellbutrin  SR 150 mg twice a day, Lamictal  150 mg daily and Lexapro  20 mg daily.  Encouraged to continue therapy with Harlene.  Recommended to call back if she has any question or any concern.  Follow-up in 3 months.  Follow Up Instructions:     I discussed the assessment and treatment plan with the patient. The patient was provided an opportunity to ask questions and all were answered. The patient agreed with the plan and demonstrated an understanding of the instructions.   The patient was advised to call back or seek an in-person evaluation if the symptoms worsen or if the condition fails to improve as anticipated.    Collaboration of Care: Other provider involved in patient's care AEB notes are available in epic to review  Patient/Guardian was advised Release of Information must be obtained prior to any record release in order to collaborate their care with an outside provider. Patient/Guardian was advised if they have not already done so to contact the registration department to sign all necessary forms in order for us  to release information regarding their care.   Consent: Patient/Guardian gives verbal consent for treatment and assignment of benefits for services provided during this visit. Patient/Guardian expressed  understanding and agreed to proceed.     Total encounter time 26 minutes which includes face-to-face time, chart reviewed, care coordination, order entry and documentation during this encounter.   Note: This document was prepared by [employer] voice dictation technology and any errors that results from this process are unintentional.    Leni ONEIDA Client, MD 04/10/2024

## 2024-04-18 ENCOUNTER — Ambulatory Visit (HOSPITAL_COMMUNITY): Admitting: Licensed Clinical Social Worker

## 2024-05-23 ENCOUNTER — Encounter (HOSPITAL_COMMUNITY): Payer: Self-pay | Admitting: Licensed Clinical Social Worker

## 2024-05-23 ENCOUNTER — Ambulatory Visit (INDEPENDENT_AMBULATORY_CARE_PROVIDER_SITE_OTHER): Admitting: Licensed Clinical Social Worker

## 2024-05-23 DIAGNOSIS — F331 Major depressive disorder, recurrent, moderate: Secondary | ICD-10-CM

## 2024-05-23 NOTE — Progress Notes (Signed)
 Virtual Visit via Video Note  I connected with Asa Fath on 05/23/24 at  1:30 PM EDT by a video enabled telemedicine application and verified that I am speaking with the correct person using two identifiers.  Location: Patient: home Provider: home office   I discussed the limitations of evaluation and management by telemedicine and the availability of in person appointments. The patient expressed understanding and agreed to proceed.   I discussed the assessment and treatment plan with the patient. The patient was provided an opportunity to ask questions and all were answered. The patient agreed with the plan and demonstrated an understanding of the instructions.   The patient was advised to call back or seek an in-person evaluation if the symptoms worsen or if the condition fails to improve as anticipated.  I provided 45 minutes of non-face-to-face time during this encounter.   Harlene JONELLE Rosser, LCSW   THERAPIST PROGRESS NOTE  Session Time: 1:45pm-2:30pm  Participation Level: Active  Behavioral Response: NeatAlertDysphoric  Type of Therapy: Individual Therapy  Treatment Goals addressed: Reduce frequency, intensity, and duration of depression symptoms so that daily functioning is improved. December will identify cognitive patterns and beliefs that support depression   ProgressTowards Goals: Progressing  Interventions: CBT  Summary: Caedence Snowden is a 73 y.o. female who presents with MDD, recurrent, moderate.   Suicidal/Homicidal: Nowithout intent/plan  Therapist Response: Khamia engaged well in individual virtual session with Facilities manager. Clinician utilized CBT to process thoughts, feelings, and interactions. 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 Clinician provided feedback about ways to address these issues. Clinician also noted that some problems will have to be overlooked and avoided in order  to maintain her own enthusiasm for the job. Clinician explored interactions and relationship with granddaughter and son in law. Clinician noted that due to work schedule, she does not get to see her granddaughter as much as she would like, but they do text. Clinician provided some language for Nguyen in order to illicit more than a 1 word answer.   Plan: Return again in 4 weeks.  Diagnosis: Major depressive disorder, recurrent episode, moderate (HCC)  Collaboration of Care: Patient refused AEB none required  Patient/Guardian was advised Release of Information must be obtained prior to any record release in order to collaborate their care with an outside provider. Patient/Guardian was advised if they have not already done so to contact the registration department to sign all necessary forms in order for us  to release information regarding their care.   Consent: Patient/Guardian gives verbal consent for treatment and assignment of benefits for services provided during this visit. Patient/Guardian expressed understanding and agreed to proceed.   Harlene JONELLE [place], LCSW 05/23/2024

## 2024-06-19 ENCOUNTER — Ambulatory Visit (INDEPENDENT_AMBULATORY_CARE_PROVIDER_SITE_OTHER): Admitting: Licensed Clinical Social Worker

## 2024-06-19 DIAGNOSIS — F330 Major depressive disorder, recurrent, mild: Secondary | ICD-10-CM | POA: Diagnosis not present

## 2024-06-20 ENCOUNTER — Encounter (HOSPITAL_COMMUNITY): Payer: Self-pay | Admitting: Licensed Clinical Social Worker

## 2024-06-20 NOTE — Progress Notes (Signed)
 Virtual Visit via Video Note  I connected with Quanda Pavlicek on 06/19/24 at  9:00 AM EST by a video enabled telemedicine application and verified that I am speaking with the correct person using two identifiers.  Location: Patient: home Provider: home office   I discussed the limitations of evaluation and management by telemedicine and the availability of in person appointments. The patient expressed understanding and agreed to proceed.   I discussed the assessment and treatment plan with the patient. The patient was provided an opportunity to ask questions and all were answered. The patient agreed with the plan and demonstrated an understanding of the instructions.   The patient was advised to call back or seek an in-person evaluation if the symptoms worsen or if the condition fails to improve as anticipated.  I provided 55 minutes of non-face-to-face time during this encounter.   Harlene JONELLE Rosser, LCSW   THERAPIST PROGRESS NOTE  Session Time: 9:00am-9:55am  Participation Level: Active  Behavioral Response: CasualLethargicDepressed and Irritable  Type of Therapy: Individual Therapy  Treatment Goals addressed:  Reduce frequency, intensity, and duration of depression symptoms so that daily functioning is improved. Tiffny will identify cognitive patterns and beliefs that support depression     ProgressTowards Goals: Not Progressing  Interventions: CBT  Summary: Carling Liberman is a 73 y.o. female who presents with MDD, recurrent, mild.   Suicidal/Homicidal: Nowithout intent/plan  Therapist Response: Jalana engaged well in individual virtual session with facilities manager. Clinician utilized CBT to process thoughts, feelings, and interactions. Clinician explored updates with work and provided feedback about her reactions to certain feedback. Processed ways to manage stress and maintain focus for the job. Also explored opportunities to work more of a day shift for her sleep  pattern. Amey shared that she likes 2nd shift and she has always worked this shift.   Plan: Return again in 2-4 weeks.  Diagnosis: MDD (major depressive disorder), recurrent episode, mild  Collaboration of Care: Patient refused AEB none required  Patient/Guardian was advised Release of Information must be obtained prior to any record release in order to collaborate their care with an outside provider. Patient/Guardian was advised if they have not already done so to contact the registration department to sign all necessary forms in order for us  to release information regarding their care.   Consent: Patient/Guardian gives verbal consent for treatment and assignment of benefits for services provided during this visit. Patient/Guardian expressed understanding and agreed to proceed.   Harlene JONELLE [place], LCSW 06/20/2024

## 2024-06-25 ENCOUNTER — Other Ambulatory Visit (HOSPITAL_COMMUNITY): Payer: Self-pay | Admitting: Family Medicine

## 2024-06-25 DIAGNOSIS — M25562 Pain in left knee: Secondary | ICD-10-CM

## 2024-06-30 ENCOUNTER — Emergency Department (HOSPITAL_COMMUNITY)
Admission: EM | Admit: 2024-06-30 | Discharge: 2024-06-30 | Disposition: A | Discharge status: Home / Self Care | Attending: Emergency Medicine | Admitting: Emergency Medicine

## 2024-06-30 ENCOUNTER — Encounter (HOSPITAL_COMMUNITY): Payer: Self-pay | Admitting: Emergency Medicine

## 2024-06-30 ENCOUNTER — Other Ambulatory Visit: Payer: Self-pay

## 2024-06-30 ENCOUNTER — Emergency Department (HOSPITAL_COMMUNITY)

## 2024-06-30 DIAGNOSIS — Z853 Personal history of malignant neoplasm of breast: Secondary | ICD-10-CM | POA: Diagnosis not present

## 2024-06-30 DIAGNOSIS — J441 Chronic obstructive pulmonary disease with (acute) exacerbation: Secondary | ICD-10-CM | POA: Insufficient documentation

## 2024-06-30 DIAGNOSIS — Z87891 Personal history of nicotine dependence: Secondary | ICD-10-CM | POA: Insufficient documentation

## 2024-06-30 DIAGNOSIS — R0602 Shortness of breath: Secondary | ICD-10-CM | POA: Diagnosis present

## 2024-06-30 LAB — BASIC METABOLIC PANEL WITH GFR
Anion gap: 13 (ref 5–15)
BUN: 11 mg/dL (ref 8–23)
CO2: 25 mmol/L (ref 22–32)
Calcium: 9.5 mg/dL (ref 8.9–10.3)
Chloride: 100 mmol/L (ref 98–111)
Creatinine, Ser: 0.81 mg/dL (ref 0.44–1.00)
GFR, Estimated: >60 mL/min (ref >60)
Glucose, Bld: 90 mg/dL (ref 70–99)
Potassium: 3.7 mmol/L (ref 3.5–5.1)
Sodium: 138 mmol/L (ref 135–145)

## 2024-06-30 LAB — CBC
HCT: 40.4 % (ref 36.0–46.0)
Hemoglobin: 13.8 g/dL (ref 12.0–15.0)
MCH: 31.4 pg (ref 26.0–34.0)
MCHC: 34.2 g/dL (ref 30.0–36.0)
MCV: 91.8 fL (ref 80.0–100.0)
Platelets: 254 K/uL (ref 150–400)
RBC: 4.4 MIL/uL (ref 3.87–5.11)
RDW: 13.9 % (ref 11.5–15.5)
WBC: 7.1 K/uL (ref 4.0–10.5)
nRBC: 0 % (ref 0.0–0.2)

## 2024-06-30 MED ORDER — IPRATROPIUM-ALBUTEROL 0.5-2.5 (3) MG/3ML IN SOLN
3.0000 mL | Freq: Once | RESPIRATORY_TRACT | Status: AC
  Administered 2024-06-30: 3 mL via RESPIRATORY_TRACT
  Filled 2024-06-30: qty 3

## 2024-06-30 MED ORDER — PREDNISONE 50 MG PO TABS
60.0000 mg | ORAL_TABLET | Freq: Once | ORAL | Status: AC
  Administered 2024-06-30: 60 mg via ORAL
  Filled 2024-06-30: qty 1

## 2024-06-30 MED ORDER — AZITHROMYCIN 250 MG PO TABS: ORAL_TABLET | ORAL | 0 refills | Status: DC

## 2024-06-30 MED ORDER — PREDNISONE 20 MG PO TABS: 40.0000 mg | ORAL_TABLET | Freq: Every day | ORAL | 0 refills | Status: AC

## 2024-06-30 NOTE — ED Triage Notes (Signed)
 Pt with hx of asthma presents tonight with c/o sob, cough, and wheezing since last night. States she used her inhaler twice without relief.

## 2024-06-30 NOTE — Discharge Instructions (Signed)
 You were evaluated in the Emergency Department and after careful evaluation, we did not find any emergent condition requiring admission or further testing in the hospital.  Your exam/testing today is overall reassuring.  Symptoms likely due to a flare of your COPD.  Recommend taking the azithromycin antibiotic as directed to treat any possible underlying lung infection.  Take the steroids to calm down the inflammation in your lungs with first dose at home morning of 11-17.  Please return to the Emergency Department if you experience any worsening of your condition.   Thank you for allowing us  to be a part of your care.

## 2024-06-30 NOTE — ED Provider Notes (Signed)
 [facility]-EMERGENCY DEPT [facility] Emergency Department Provider Note MRN:  [id number]  Arrival date & time: 06/30/24     Chief Complaint   Shortness of Breath   History of Present Illness   Alice Bowers is a 73 y.o. year-old female with a history of COPD, PE presenting to the ED with chief complaint of shortness of breath.  Increased wheezing cough and shortness of breath since yesterday.  Denies chest pain other than soreness with cough on occasion.  No fever.  Review of Systems  A thorough review of systems was obtained and all systems are negative except as noted in the HPI and PMH.   Patient's Health History    Past Medical History:  Diagnosis Date   Anxiety    Arthritis    Cancer (HCC)    R breast cancer   Depression    Difficult intubation    Heart murmur    insignificant per MD per pt   High blood pressure    High cholesterol    History of kidney stones    Macular degeneration of left eye    Macular degeneration of right eye    Obstructive sleep apnea    has cpap   Restless leg syndrome    V tach (HCC)    hx of with sleep study laying on back per pt - 2016    Past Surgical History:  Procedure Laterality Date   ABDOMINAL HYSTERECTOMY     APPENDECTOMY     arthroscopy left shoulder     CHOLECYSTECTOMY     joint fusion bilateral thumbs     MASTECTOMY Bilateral 2019   ORIF right ankle     REPLACEMENT TOTAL KNEE BILATERAL     REVERSE SHOULDER ARTHROPLASTY Right 10/14/2021   Procedure: REVERSE SHOULDER ARTHROPLASTY;  Surgeon: Dozier Soulier, MD;  Location: [facility];  Service: Orthopedics;  Laterality: Right;    Family History  Problem Relation Age of Onset   Depression Mother    Hyperlipidemia Mother    Hypertension Mother    Depression Brother    Hyperlipidemia Father    Hypertension Father    Asthma Father     Social History   Socioeconomic History   Marital status: Widowed    Spouse name: Not on file   Number of children: Not on file    Years of education: Not on file   Highest education level: Not on file  Occupational History   Not on file  Tobacco Use   Smoking status: Former    Current packs/day: 0.00    Average packs/day: 1 pack/day for 25.0 years (25.0 ttl pk-yrs)    Types: Cigarettes    Start date: 09/13/1996    Quit date: 09/13/2021    Years since quitting: 2.7   Smokeless tobacco: Never  Vaping Use   Vaping status: Former  Substance and Sexual Activity   Alcohol use: Yes    Comment: rare   Drug use: No   Sexual activity: Not Currently  Other Topics Concern   Not on file  Social History Narrative   Not on file   Social Drivers of Health   Financial Resource Strain: Low Risk  (06/06/2024)   Received from [facility]   Overall Financial Resource Strain (CARDIA)    How hard is it for you to pay for the very basics like food, housing, medical care, and heating?: Not very hard  Food Insecurity: No Food Insecurity (06/06/2024)   Received from [facility]   Hunger  Vital Sign    Within the past 12 months, you worried that your food would run out before you got the money to buy more.: Never true    Within the past 12 months, the food you bought just didn't last and you didn't have money to get more.: Never true  Transportation Needs: No Transportation Needs (06/06/2024)   Received from [facility] - Transportation    In the past 12 months, has lack of transportation kept you from meetings, work, or from getting things needed for daily living?: No    In the past 12 months, has lack of transportation kept you from medical appointments or from getting medications?: No  Physical Activity: Inactive (01/14/2022)   [facility]   Exercise Vital Sign    On average, how many days per week do you engage in moderate to strenuous exercise (like a brisk walk)?: 0 days    On average, how many minutes do you engage in exercise at this level?: 0 min  Stress: No Stress Concern Present  (02/05/2024)   Received from [facility] - Occupational Stress Questionnaire    Feeling of Stress : Only a little  Social Connections: Somewhat Isolated (01/14/2022)   Received from [facility]   Social Network    How would you rate your social network (family, work, friends)?: Restricted participation with some degree of social isolation  Intimate Partner Violence: Not At Risk (03/28/2024)   Received from [facility]   HITS    Over the last 12 months how often did your partner physically hurt you?: Never    Over the last 12 months how often did your partner insult you or talk down to you?: Never    Over the last 12 months how often did your partner threaten you with physical harm?: Never    Over the last 12 months how often did your partner scream or curse at you?: Never     Physical Exam   Vitals:   06/30/24 0058 06/30/24 0107  BP: (!) 119/99   Pulse: 83   Resp: (!) 22   Temp: 97.8 F (36.6 C)   SpO2: 97% 95%    CONSTITUTIONAL: Chronically ill-appearing, NAD NEURO/PSYCH:  Alert and oriented x 3, no focal deficits EYES:  eyes equal and reactive ENT/NECK:  no LAD, no JVD CARDIO: Regular rate, well-perfused, normal S1 and S2 PULM: Faint wheezing, no increased work of breathing GI/GU:  non-distended, non-tender MSK/SPINE:  No gross deformities, no edema SKIN:  no rash, atraumatic   *Additional and/or pertinent findings included in MDM below  Diagnostic and Interventional Summary    EKG Interpretation Date/Time:  Sunday June 30 2024 01:00:48 EST Ventricular Rate:  83 PR Interval:  208 QRS Duration:  151 QT Interval:  415 QTC Calculation: 488 R Axis:   -48  Text Interpretation: Sinus rhythm RBBB and LAFB Confirmed by Theadore Sharper [phone]) on 06/30/2024 1:24:57 AM       Labs Reviewed  BASIC METABOLIC PANEL WITH GFR  CBC    DG Chest Portable 1 View  Final Result      Medications  ipratropium-albuterol   (DUONEB) 0.5-2.5 (3) MG/3ML nebulizer solution 3 mL (3 mLs Nebulization Given 06/30/24 0126)  predniSONE  (DELTASONE ) tablet 60 mg (60 mg Oral Given 06/30/24 0125)     Procedures  /  Critical Care Procedures  ED Course and Medical Decision Making  Initial Impression and Ddx Suspect mild asthma  exacerbation.  Other considerations include pneumonia, pneumothorax.  Doubt ACS.  Generally looks well without acute distress.  Reassuring vitals.  History of PE, anticoagulated but not perfect with her compliance.  No tachycardia, no hypoxia, overall doubt PE at this time.  Past medical/surgical history that increases complexity of ED encounter: COPD  Interpretation of Diagnostics I personally reviewed the EKG and my interpretation is as follows: Sinus rhythm, right bundle branch block new from prior few years ago  No significant blood count or electrolyte disturbance.  Chest x-ray unremarkable.  Patient Reassessment and Ultimate Disposition/Management     Patient feeling better, looks improved, lung sounds improved, vitals remain normal.  Has a productive cough that is more prevalent on repeat exam.  Will discharge home with Z-Pak, steroids, return precautions.  Patient management required discussion with the following services or consulting groups:  None  Complexity of Problems Addressed Acute illness or injury that poses threat of life of bodily function  Additional Data Reviewed and Analyzed Further history obtained from: Prior labs/imaging results  Additional Factors Impacting ED Encounter Risk Prescriptions and Consideration of hospitalization  Ozell HERO. Theadore, MD [facility] Emergency Medicine [facility] [email or web address]  Final Clinical Impressions(s) / ED Diagnoses     ICD-10-CM   1. COPD exacerbation (HCC)  J44.1       ED Discharge Orders          Ordered    azithromycin (ZITHROMAX) 250 MG tablet        06/30/24 0233    predniSONE  (DELTASONE ) 20 MG  tablet  Daily        06/30/24 0233             Discharge Instructions Discussed with and Provided to Patient:     Discharge Instructions      You were evaluated in the Emergency Department and after careful evaluation, we did not find any emergent condition requiring admission or further testing in the hospital.  Your exam/testing today is overall reassuring.  Symptoms likely due to a flare of your COPD.  Recommend taking the azithromycin antibiotic as directed to treat any possible underlying lung infection.  Take the steroids to calm down the inflammation in your lungs with first dose at home morning of 11-17.  Please return to the Emergency Department if you experience any worsening of your condition.   Thank you for allowing us  to be a part of your care.       [name], MD 06/30/24 [phone]

## 2024-07-03 ENCOUNTER — Encounter (HOSPITAL_COMMUNITY)
Admission: RE | Admit: 2024-07-03 | Discharge: 2024-07-03 | Disposition: A | Discharge status: Home / Self Care | Source: Ambulatory Visit | Attending: Family Medicine | Admitting: Family Medicine

## 2024-07-03 DIAGNOSIS — M25562 Pain in left knee: Secondary | ICD-10-CM | POA: Insufficient documentation

## 2024-07-03 MED ORDER — TECHNETIUM TC 99M MEDRONATE IV KIT
20.0000 | PACK | Freq: Once | INTRAVENOUS | Status: AC | PRN
  Administered 2024-07-03: 21 via INTRAVENOUS

## 2024-07-08 ENCOUNTER — Encounter (HOSPITAL_COMMUNITY): Payer: Self-pay | Admitting: Psychiatry

## 2024-07-08 ENCOUNTER — Telehealth (HOSPITAL_COMMUNITY): Admitting: Psychiatry

## 2024-07-08 VITALS — Wt 242.0 lb

## 2024-07-08 DIAGNOSIS — F419 Anxiety disorder, unspecified: Secondary | ICD-10-CM

## 2024-07-08 DIAGNOSIS — F331 Major depressive disorder, recurrent, moderate: Secondary | ICD-10-CM | POA: Diagnosis not present

## 2024-07-08 MED ORDER — LAMOTRIGINE 150 MG PO TABS
150.0000 mg | ORAL_TABLET | Freq: Every day | ORAL | 2 refills | Status: AC
Start: 2024-07-08 — End: ?

## 2024-07-08 MED ORDER — ESCITALOPRAM OXALATE 20 MG PO TABS
20.0000 mg | ORAL_TABLET | Freq: Every day | ORAL | 2 refills | Status: AC
Start: 2024-07-08 — End: ?

## 2024-07-08 MED ORDER — BUPROPION HCL ER (SR) 150 MG PO TB12
150.0000 mg | ORAL_TABLET | Freq: Two times a day (BID) | ORAL | 2 refills | Status: AC
Start: 2024-07-08 — End: ?

## 2024-07-08 NOTE — Progress Notes (Signed)
 [facility] MD Virtual Progress Note   Patient Location: Home Provider Location: Home Office  I connect with patient by video and verified that I am speaking with correct person by using two identifiers. I discussed the limitations of evaluation and management by telemedicine and the availability of in person appointments. I also discussed with the patient that there may be a patient responsible charge related to this service. The patient expressed understanding and agreed to proceed.  Alice Bowers [id number] 73 y.o.  07/08/2024 10:41 AM  History of Present Illness:  Patient is evaluated via video session.  She reported some concern about her general health as recently had a flareup of asthma and given steroids.  She is doing better.  She also saw PCP who started her on thyroid medicine after found high TSH.  She noticed that had helped her a lot and she has been doing better.  She denies any crying spells, feeling of hopelessness or worthlessness.  She still gets sometimes frustrated with the job but also happy that does not have to see the supervisor more frequently.  She is trying to see granddaughter but reported she is very busy but plan is to see her on Christmas breakfast.  Patient is taking all her medication and denies any side effects.  She denies any suicidal thoughts or homicidal thoughts.  She is on Lamictal , Lexapro  and Wellbutrin .  She has no tremor or shakes or any EPS.  Denies any panic attack.  She like to keep the current medication.  Past Psychiatric History: H/O depression since 52 when going through divorce.  H/O 4 inpatient treatment. H/O suicidal attempt by taking overdose on Seroquel.  No h/o paranoia, psychosis or aggressive behavior.  Tried Paxil, Zoloft, Remeron (weight gain) and Seroquel with limited response.   Past Medical History:  Diagnosis Date   Anxiety    Arthritis    Cancer (HCC)    R breast cancer   Depression    Difficult  intubation    Heart murmur    insignificant per MD per pt   High blood pressure    High cholesterol    History of kidney stones    Macular degeneration of left eye    Macular degeneration of right eye    Obstructive sleep apnea    has cpap   Restless leg syndrome    V tach (HCC)    hx of with sleep study laying on back per pt - 2016    Outpatient Encounter Medications as of 07/08/2024  Medication Sig   Albuterol  Sulfate 108 (90 Base) MCG/ACT AEPB Inhale 1-2 puffs into the lungs 4 (four) times daily as needed (Asthma/Wheezing/ Shortness of breath).   amLODipine (NORVASC) 2.5 MG tablet Take 2.5 mg by mouth daily.    anastrozole (ARIMIDEX) 1 MG tablet Take 1 mg by mouth daily.   atorvastatin (LIPITOR) 40 MG tablet Take 40 mg by mouth daily.   azithromycin  (ZITHROMAX ) 250 MG tablet Take 2 tablets together on the first day, then 1 every day until finished.   buPROPion  (WELLBUTRIN  SR) 150 MG 12 hr tablet Take 1 tablet (150 mg total) by mouth 2 (two) times daily.   calcium-vitamin D 250-100 MG-UNIT tablet Take 1 tablet by mouth daily.   ELIQUIS 5 MG TABS tablet Take by mouth.   escitalopram  (LEXAPRO ) 20 MG tablet Take 1 tablet (20 mg total) by mouth daily.   fluticasone (FLONASE) 50 MCG/ACT nasal spray Place 1 spray into both nostrils daily as  needed for allergies or rhinitis.   hydrochlorothiazide (HYDRODIURIL) 25 MG tablet Take 25 mg by mouth daily.   ipratropium-albuterol  (DUONEB) 0.5-2.5 (3) MG/3ML SOLN Take 3 mLs by nebulization every 6 (six) hours as needed (asthma).   lamoTRIgine  (LAMICTAL ) 150 MG tablet Take 1 tablet (150 mg total) by mouth daily.   losartan (COZAAR) 100 MG tablet Take 100 mg by mouth daily.   modafinil (PROVIGIL) 200 MG tablet Take 200 mg by mouth every morning.   Multiple Vitamin (MULTIVITAMIN WITH MINERALS) TABS tablet Take 1 tablet by mouth daily.   pramipexole (MIRAPEX) 0.5 MG tablet Take 0.5 mg by mouth in the morning and at bedtime.   TRELEGY ELLIPTA  200-62.5-25 MCG/ACT AEPB Take 1 puff by mouth daily.   [DISCONTINUED] allopurinol (ZYLOPRIM) 300 MG tablet Take 300 mg by mouth daily.   No facility-administered encounter medications on file as of 07/08/2024.    Recent Results (from the past 2160 hours)  Basic metabolic panel     Status: None   Collection Time: 06/30/24  1:05 AM  Result Value Ref Range   Sodium 138 135 - 145 mmol/L   Potassium 3.7 3.5 - 5.1 mmol/L   Chloride 100 98 - 111 mmol/L   CO2 25 22 - 32 mmol/L   Glucose, Bld 90 70 - 99 mg/dL    Comment: Glucose reference range applies only to samples taken after fasting for at least 8 hours.   BUN 11 8 - 23 mg/dL   Creatinine, Ser 9.18 0.44 - 1.00 mg/dL   Calcium 9.5 8.9 - 89.6 mg/dL   GFR, Estimated >39 >39 mL/min    Comment: (NOTE) Calculated using the CKD-EPI Creatinine Equation (2021)    Anion gap 13 5 - 15    Comment: Performed at [facility], [address], [place], [place] [address]  CBC     Status: None   Collection Time: 06/30/24  1:05 AM  Result Value Ref Range   WBC 7.1 4.0 - 10.5 K/uL   RBC 4.40 3.87 - 5.11 MIL/uL   Hemoglobin 13.8 12.0 - 15.0 g/dL   HCT 59.5 63.9 - 53.9 %   MCV 91.8 80.0 - 100.0 fL   MCH 31.4 26.0 - 34.0 pg   MCHC 34.2 30.0 - 36.0 g/dL   RDW 86.0 88.4 - 84.4 %   Platelets 254 150 - 400 K/uL   nRBC 0.0 0.0 - 0.2 %    Comment: Performed at [facility], [address], [place], [place] [address]     Psychiatric Specialty Exam: Physical Exam  Review of Systems  Weight 242 lb (109.8 kg).There is no height or weight on file to calculate BMI.  General Appearance: Casual  Eye Contact:  Good  Speech:  Clear and Coherent  Volume:  Normal  Mood:  Euthymic  Affect:  Appropriate  Thought Process:  Goal Directed  Orientation:  Full (Time, Place, and Person)  Thought Content:  WDL  Suicidal Thoughts:  No  Homicidal Thoughts:  No  Memory:  Immediate;   Good Recent;   Good Remote;   Good  Judgement:  Good  Insight:  Good   Psychomotor Activity:  Normal  Concentration:  Concentration: Good and Attention Span: Good  Recall:  Good  Fund of Knowledge:  Good  Language:  Good  Akathisia:  No  Handed:  Right  AIMS (if indicated):     Assets:  Communication Skills Desire for Improvement Housing Transportation  ADL's:  Intact  Cognition:  WNL  Sleep:  ok       09/10/2020    6:32 PM  Depression screen PHQ 2/9  Decreased Interest 1  Down, Depressed, Hopeless 1  PHQ - 2 Score 2  Altered sleeping 1  Tired, decreased energy 2  Change in appetite 1  Feeling bad or failure about yourself  0  Trouble concentrating 0  Moving slowly or fidgety/restless 0  Suicidal thoughts 0  PHQ-9 Score 6   Difficult doing work/chores Somewhat difficult     Data saved with a previous flowsheet row definition    Assessment/Plan: Major depressive disorder, recurrent episode, moderate (HCC) - Plan: buPROPion  (WELLBUTRIN  SR) 150 MG 12 hr tablet, escitalopram  (LEXAPRO ) 20 MG tablet, lamoTRIgine  (LAMICTAL ) 150 MG tablet  Anxiety - Plan: escitalopram  (LEXAPRO ) 20 MG tablet  Patient is 73 year old employed female with history of hypertension, sleep apnea, asthma, osteoarthritis, hyperlipidemia, getting immunoglobulin infusion status post breast cancer and recently diagnosed with thyroid issue and getting Synthroid.  She is doing better on medication especially since started thyroid medication.  She does not want to change the current dose.  She is in therapy with Harlene which is also going well.  She is looking forward to have a Christmas breakfast with her granddaughter.  Continue Lexapro  20 mg daily, Lamictal  150 mg daily and Wellbutrin  SR 150 mg twice a day.  So far no tremor or shakes.  Recommend to call back if she has any question or any concern.  Follow-up in 3 months   Follow Up Instructions:     I discussed the assessment and treatment plan with the patient. The patient was provided an opportunity to ask questions and  all were answered. The patient agreed with the plan and demonstrated an understanding of the instructions.   The patient was advised to call back or seek an in-person evaluation if the symptoms worsen or if the condition fails to improve as anticipated.    Collaboration of Care: Other provider involved in patient's care AEB notes are available in epic to review  Patient/Guardian was advised Release of Information must be obtained prior to any record release in order to collaborate their care with an outside provider. Patient/Guardian was advised if they have not already done so to contact the registration department to sign all necessary forms in order for us  to release information regarding their care.   Consent: Patient/Guardian gives verbal consent for treatment and assignment of benefits for services provided during this visit. Patient/Guardian expressed understanding and agreed to proceed.     Total encounter time 21 minutes which includes face-to-face time, chart reviewed, care coordination, order entry and documentation during this encounter.   Note: This document was prepared by [employer] voice dictation technology and any errors that results from this process are unintentional.    Leni ONEIDA Client, MD 07/08/2024

## 2024-07-17 ENCOUNTER — Encounter (HOSPITAL_COMMUNITY): Payer: Self-pay | Admitting: Licensed Clinical Social Worker

## 2024-07-17 ENCOUNTER — Ambulatory Visit (INDEPENDENT_AMBULATORY_CARE_PROVIDER_SITE_OTHER): Admitting: Licensed Clinical Social Worker

## 2024-07-17 DIAGNOSIS — F331 Major depressive disorder, recurrent, moderate: Secondary | ICD-10-CM
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
## 2024-07-17 NOTE — Addendum Note (Signed)
 Addended by: BRET RAISIN R on: 07/17/2024 01:10 PM   Modules accepted: Level of Service

## 2024-08-07 ENCOUNTER — Encounter (HOSPITAL_COMMUNITY): Payer: Self-pay | Admitting: Licensed Clinical Social Worker

## 2024-08-07 ENCOUNTER — Ambulatory Visit (HOSPITAL_COMMUNITY): Admitting: Licensed Clinical Social Worker

## 2024-08-07 DIAGNOSIS — F330 Major depressive disorder, recurrent, mild: Secondary | ICD-10-CM
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
## 2024-08-29 ENCOUNTER — Ambulatory Visit (HOSPITAL_COMMUNITY): Admitting: Licensed Clinical Social Worker

## 2024-08-29 ENCOUNTER — Encounter (HOSPITAL_COMMUNITY): Payer: Self-pay

## 2024-08-29 DIAGNOSIS — F331 Major depressive disorder, recurrent, moderate: Secondary | ICD-10-CM

## 2024-09-02 ENCOUNTER — Encounter (HOSPITAL_COMMUNITY): Payer: Self-pay | Admitting: Licensed Clinical Social Worker

## 2024-09-02 NOTE — Progress Notes (Signed)
 Virtual Visit via Video Note  I connected with Alice Bowers on 08/29/24 at  9:00 AM EST by a video enabled telemedicine application and verified that I am speaking with the correct person using two identifiers.  Location: Patient: home Provider: home office   I discussed the limitations of evaluation and management by telemedicine and the availability of in person appointments. The patient expressed understanding and agreed to proceed.   I discussed the assessment and treatment plan with the patient. The patient was provided an opportunity to ask questions and all were answered. The patient agreed with the plan and demonstrated an understanding of the instructions.   The patient was advised to call back or seek an in-person evaluation if the symptoms worsen or if the condition fails to improve as anticipated.  I provided 45 minutes of non-face-to-face time during this encounter.   Alice JONELLE Rosser, LCSW   THERAPIST PROGRESS NOTE  Session Time: 9:00am-9:45am  Participation Level: Active  Behavioral Response: NeatAlertDepressed  Type of Therapy: Individual Therapy  Treatment Goals addressed:  Active     OP Depression     LTG: Reduce frequency, intensity, and duration of depression symptoms so that daily functioning is improved (Progressing)     Start:  09/08/22    Expected End:  03/09/23         STG: Alice Bowers will identify cognitive patterns and beliefs that support depression (Progressing)     Start:  09/08/22    Expected End:  03/09/23         Work with Alice Bowers to identify the major components of a recent episode of depression: physical symptoms, major thoughts and images, and major behaviors they experienced     Start:  09/08/22         Therapist will educate patient on cognitive distortions and the rationale for treatment of depression     Start:  09/08/22            ProgressTowards Goals: Progressing  Interventions: Motivational  Interviewing  Summary: Alice Bowers is a 74 y.o. female who presents with MDD recurrent, moderate.   Suicidal/Homicidal: Nowithout intent/plan  Therapist Response: Alice Bowers engaged well in individual virtual session with facilities manager. Clinician utilized MI OARS to reflect and summarize thoughts, feelings, and interactions. Clinician explored concerns and frustration about work. Clinician identified options for transitioning to another opportunity. Alice Bowers shared concerns about age and identified that she does not make enough money to retire. Clinician explored current stressors at work and reflected Alice Bowers's decision to communicate with HR. Clinician validated her feelings about how she is being treated. Clinician also discussed the importance of making it work until she has another opportunity.   Plan: Return again in 2-4 weeks.  Diagnosis: Major depressive disorder, recurrent episode, moderate (HCC)  Collaboration of Care: Patient refused AEB none required. Medication is doing what it can. Current depression due to environmental and financial stress  Patient/Guardian was advised Release of Information must be obtained prior to any record release in order to collaborate their care with an outside provider. Patient/Guardian was advised if they have not already done so to contact the registration department to sign all necessary forms in order for us  to release information regarding their care.   Consent: Patient/Guardian gives verbal consent for treatment and assignment of benefits for services provided during this visit. Patient/Guardian expressed understanding and agreed to proceed.   Alice JONELLE [place], LCSW 09/02/2024

## 2024-09-07 ENCOUNTER — Encounter (HOSPITAL_COMMUNITY): Payer: Self-pay | Admitting: *Deleted

## 2024-09-07 ENCOUNTER — Emergency Department (HOSPITAL_COMMUNITY)

## 2024-09-07 ENCOUNTER — Observation Stay (HOSPITAL_COMMUNITY)
Admission: EM | Admit: 2024-09-07 | Discharge: 2024-09-09 | DRG: 871 | Disposition: A | Attending: Hospitalist | Admitting: Hospitalist

## 2024-09-07 ENCOUNTER — Other Ambulatory Visit: Payer: Self-pay

## 2024-09-07 DIAGNOSIS — A4189 Other specified sepsis: Principal | ICD-10-CM | POA: Diagnosis present

## 2024-09-07 DIAGNOSIS — J9601 Acute respiratory failure with hypoxia: Secondary | ICD-10-CM | POA: Diagnosis not present

## 2024-09-07 DIAGNOSIS — F419 Anxiety disorder, unspecified: Secondary | ICD-10-CM | POA: Diagnosis present

## 2024-09-07 DIAGNOSIS — Z7901 Long term (current) use of anticoagulants: Secondary | ICD-10-CM

## 2024-09-07 DIAGNOSIS — Z23 Encounter for immunization: Secondary | ICD-10-CM

## 2024-09-07 DIAGNOSIS — H3530 Unspecified macular degeneration: Secondary | ICD-10-CM | POA: Diagnosis present

## 2024-09-07 DIAGNOSIS — Z825 Family history of asthma and other chronic lower respiratory diseases: Secondary | ICD-10-CM

## 2024-09-07 DIAGNOSIS — Z79811 Long term (current) use of aromatase inhibitors: Secondary | ICD-10-CM

## 2024-09-07 DIAGNOSIS — A419 Sepsis, unspecified organism: Secondary | ICD-10-CM | POA: Diagnosis present

## 2024-09-07 DIAGNOSIS — J102 Influenza due to other identified influenza virus with gastrointestinal manifestations: Secondary | ICD-10-CM | POA: Diagnosis present

## 2024-09-07 DIAGNOSIS — Z79899 Other long term (current) drug therapy: Secondary | ICD-10-CM

## 2024-09-07 DIAGNOSIS — G2581 Restless legs syndrome: Secondary | ICD-10-CM | POA: Diagnosis present

## 2024-09-07 DIAGNOSIS — C50911 Malignant neoplasm of unspecified site of right female breast: Secondary | ICD-10-CM | POA: Diagnosis present

## 2024-09-07 DIAGNOSIS — Z885 Allergy status to narcotic agent status: Secondary | ICD-10-CM

## 2024-09-07 DIAGNOSIS — Z83438 Family history of other disorder of lipoprotein metabolism and other lipidemia: Secondary | ICD-10-CM

## 2024-09-07 DIAGNOSIS — Z96611 Presence of right artificial shoulder joint: Secondary | ICD-10-CM | POA: Diagnosis present

## 2024-09-07 DIAGNOSIS — J101 Influenza due to other identified influenza virus with other respiratory manifestations: Secondary | ICD-10-CM | POA: Diagnosis present

## 2024-09-07 DIAGNOSIS — J441 Chronic obstructive pulmonary disease with (acute) exacerbation: Secondary | ICD-10-CM | POA: Diagnosis present

## 2024-09-07 DIAGNOSIS — E782 Mixed hyperlipidemia: Secondary | ICD-10-CM | POA: Diagnosis present

## 2024-09-07 DIAGNOSIS — Z9013 Acquired absence of bilateral breasts and nipples: Secondary | ICD-10-CM

## 2024-09-07 DIAGNOSIS — Z716 Tobacco abuse counseling: Secondary | ICD-10-CM

## 2024-09-07 DIAGNOSIS — Z818 Family history of other mental and behavioral disorders: Secondary | ICD-10-CM

## 2024-09-07 DIAGNOSIS — I10 Essential (primary) hypertension: Secondary | ICD-10-CM | POA: Diagnosis present

## 2024-09-07 DIAGNOSIS — I4510 Unspecified right bundle-branch block: Secondary | ICD-10-CM | POA: Diagnosis present

## 2024-09-07 DIAGNOSIS — Z96653 Presence of artificial knee joint, bilateral: Secondary | ICD-10-CM | POA: Diagnosis present

## 2024-09-07 DIAGNOSIS — J45901 Unspecified asthma with (acute) exacerbation: Secondary | ICD-10-CM | POA: Diagnosis present

## 2024-09-07 DIAGNOSIS — R600 Localized edema: Principal | ICD-10-CM

## 2024-09-07 DIAGNOSIS — F17200 Nicotine dependence, unspecified, uncomplicated: Secondary | ICD-10-CM | POA: Diagnosis present

## 2024-09-07 DIAGNOSIS — Z882 Allergy status to sulfonamides status: Secondary | ICD-10-CM

## 2024-09-07 DIAGNOSIS — Z8249 Family history of ischemic heart disease and other diseases of the circulatory system: Secondary | ICD-10-CM

## 2024-09-07 DIAGNOSIS — G4733 Obstructive sleep apnea (adult) (pediatric): Secondary | ICD-10-CM | POA: Diagnosis present

## 2024-09-07 DIAGNOSIS — Z1152 Encounter for screening for COVID-19: Secondary | ICD-10-CM

## 2024-09-07 DIAGNOSIS — Z7951 Long term (current) use of inhaled steroids: Secondary | ICD-10-CM

## 2024-09-07 DIAGNOSIS — Z881 Allergy status to other antibiotic agents status: Secondary | ICD-10-CM

## 2024-09-07 DIAGNOSIS — I9589 Other hypotension: Secondary | ICD-10-CM

## 2024-09-07 DIAGNOSIS — Z888 Allergy status to other drugs, medicaments and biological substances status: Secondary | ICD-10-CM

## 2024-09-07 LAB — MAGNESIUM: Magnesium: 1.9 mg/dL (ref 1.7–2.4)

## 2024-09-07 LAB — COMPREHENSIVE METABOLIC PANEL WITH GFR
ALT: 21 U/L (ref 0–44)
AST: 54 U/L — ABNORMAL HIGH (ref 15–41)
Albumin: 3.7 g/dL (ref 3.5–5.0)
Alkaline Phosphatase: 106 U/L (ref 38–126)
Anion gap: 14 (ref 5–15)
BUN: 12 mg/dL (ref 8–23)
CO2: 22 mmol/L (ref 22–32)
Calcium: 9.1 mg/dL (ref 8.9–10.3)
Chloride: 99 mmol/L (ref 98–111)
Creatinine, Ser: 0.9 mg/dL (ref 0.44–1.00)
GFR, Estimated: >60 mL/min
Glucose, Bld: 100 mg/dL — ABNORMAL HIGH (ref 70–99)
Potassium: 3.2 mmol/L — ABNORMAL LOW (ref 3.5–5.1)
Sodium: 135 mmol/L (ref 135–145)
Total Bilirubin: 0.4 mg/dL (ref 0.0–1.2)
Total Protein: 6.4 g/dL — ABNORMAL LOW (ref 6.5–8.1)

## 2024-09-07 LAB — CBC WITH DIFFERENTIAL/PLATELET
Abs Immature Granulocytes: 0.02 10*3/uL (ref 0.00–0.07)
Basophils Absolute: 0 10*3/uL (ref 0.0–0.1)
Basophils Relative: 0 %
Eosinophils Absolute: 0 10*3/uL (ref 0.0–0.5)
Eosinophils Relative: 0 %
HCT: 39 % (ref 36.0–46.0)
Hemoglobin: 13.1 g/dL (ref 12.0–15.0)
Immature Granulocytes: 0 %
Lymphocytes Relative: 23 %
Lymphs Abs: 1.4 10*3/uL (ref 0.7–4.0)
MCH: 30.8 pg (ref 26.0–34.0)
MCHC: 33.6 g/dL (ref 30.0–36.0)
MCV: 91.5 fL (ref 80.0–100.0)
Monocytes Absolute: 0.8 10*3/uL (ref 0.1–1.0)
Monocytes Relative: 13 %
Neutro Abs: 3.8 10*3/uL (ref 1.7–7.7)
Neutrophils Relative %: 64 %
Platelets: 182 10*3/uL (ref 150–400)
RBC: 4.26 MIL/uL (ref 3.87–5.11)
RDW: 13.9 % (ref 11.5–15.5)
WBC: 6 10*3/uL (ref 4.0–10.5)
nRBC: 0 % (ref 0.0–0.2)

## 2024-09-07 LAB — RESP PANEL BY RT-PCR (RSV, FLU A&B, COVID)  RVPGX2
Influenza A by PCR: POSITIVE — AB
Influenza B by PCR: NEGATIVE
Resp Syncytial Virus by PCR: NEGATIVE
SARS Coronavirus 2 by RT PCR: NEGATIVE

## 2024-09-07 LAB — TROPONIN T, HIGH SENSITIVITY
Troponin T High Sensitivity: 17 ng/L (ref 0–19)
Troponin T High Sensitivity: 24 ng/L — ABNORMAL HIGH (ref 0–19)

## 2024-09-07 LAB — LACTIC ACID, PLASMA
Lactic Acid, Venous: 1.5 mmol/L (ref 0.5–1.9)
Lactic Acid, Venous: 2 mmol/L (ref 0.5–1.9)

## 2024-09-07 MED ORDER — MAGNESIUM SULFATE 2 GM/50ML IV SOLN
2.0000 g | Freq: Once | INTRAVENOUS | Status: AC
  Administered 2024-09-07: 2 g via INTRAVENOUS
  Filled 2024-09-07: qty 50

## 2024-09-07 MED ORDER — METHYLPREDNISOLONE SODIUM SUCC 40 MG IJ SOLR
40.0000 mg | Freq: Two times a day (BID) | INTRAMUSCULAR | Status: DC
  Administered 2024-09-07 – 2024-09-09 (×4): 40 mg via INTRAVENOUS
  Filled 2024-09-07 (×4): qty 1

## 2024-09-07 MED ORDER — PRAMIPEXOLE DIHYDROCHLORIDE 1 MG PO TABS
0.5000 mg | ORAL_TABLET | Freq: Two times a day (BID) | ORAL | Status: DC
  Administered 2024-09-07 – 2024-09-09 (×4): 0.5 mg via ORAL
  Filled 2024-09-07 (×4): qty 1

## 2024-09-07 MED ORDER — IPRATROPIUM-ALBUTEROL 0.5-2.5 (3) MG/3ML IN SOLN: 3.0000 mL | Freq: Four times a day (QID) | RESPIRATORY_TRACT | Status: DC

## 2024-09-07 MED ORDER — SODIUM CHLORIDE 0.9 % IV BOLUS
1000.0000 mL | Freq: Once | INTRAVENOUS | Status: AC
  Administered 2024-09-07: 1000 mL via INTRAVENOUS

## 2024-09-07 MED ORDER — FLUTICASONE PROPIONATE 50 MCG/ACT NA SUSP: 1.0000 | Freq: Every day | NASAL | Status: DC | PRN

## 2024-09-07 MED ORDER — LACTATED RINGERS IV SOLN: INTRAVENOUS | Status: DC

## 2024-09-07 MED ORDER — IPRATROPIUM-ALBUTEROL 0.5-2.5 (3) MG/3ML IN SOLN
3.0000 mL | Freq: Three times a day (TID) | RESPIRATORY_TRACT | Status: DC
  Administered 2024-09-08 – 2024-09-09 (×4): 3 mL via RESPIRATORY_TRACT
  Filled 2024-09-07 (×5): qty 3

## 2024-09-07 MED ORDER — ALBUTEROL SULFATE (2.5 MG/3ML) 0.083% IN NEBU
2.5000 mg | INHALATION_SOLUTION | Freq: Four times a day (QID) | RESPIRATORY_TRACT | Status: DC | PRN
  Administered 2024-09-08: 2.5 mg via RESPIRATORY_TRACT
  Filled 2024-09-07: qty 3

## 2024-09-07 MED ORDER — ENOXAPARIN SODIUM 60 MG/0.6ML IJ SOSY: 50.0000 mg | PREFILLED_SYRINGE | INTRAMUSCULAR | Status: DC

## 2024-09-07 MED ORDER — BUPROPION HCL ER (SR) 150 MG PO TB12
150.0000 mg | ORAL_TABLET | Freq: Two times a day (BID) | ORAL | Status: DC
  Administered 2024-09-07 – 2024-09-09 (×4): 150 mg via ORAL
  Filled 2024-09-07 (×4): qty 1

## 2024-09-07 MED ORDER — IPRATROPIUM-ALBUTEROL 0.5-2.5 (3) MG/3ML IN SOLN
3.0000 mL | Freq: Once | RESPIRATORY_TRACT | Status: AC
  Administered 2024-09-07: 3 mL via RESPIRATORY_TRACT
  Filled 2024-09-07: qty 3

## 2024-09-07 MED ORDER — ACETAMINOPHEN 650 MG RE SUPP: 650.0000 mg | Freq: Four times a day (QID) | RECTAL | Status: DC | PRN

## 2024-09-07 MED ORDER — APIXABAN 5 MG PO TABS: 5.0000 mg | ORAL_TABLET | Freq: Two times a day (BID) | ORAL | Status: DC

## 2024-09-07 MED ORDER — LAMOTRIGINE 25 MG PO TABS
150.0000 mg | ORAL_TABLET | Freq: Every day | ORAL | Status: DC
  Administered 2024-09-07 – 2024-09-08 (×2): 150 mg via ORAL
  Filled 2024-09-07 (×2): qty 2

## 2024-09-07 MED ORDER — ACETAMINOPHEN 325 MG PO TABS
650.0000 mg | ORAL_TABLET | Freq: Four times a day (QID) | ORAL | Status: DC | PRN
  Administered 2024-09-08: 650 mg via ORAL
  Filled 2024-09-07: qty 2

## 2024-09-07 MED ORDER — LACTATED RINGERS IV BOLUS
1000.0000 mL | Freq: Once | INTRAVENOUS | Status: AC
  Administered 2024-09-07: 1000 mL via INTRAVENOUS

## 2024-09-07 MED ORDER — BUDESON-GLYCOPYRROL-FORMOTEROL 160-9-4.8 MCG/ACT IN AERO
2.0000 | INHALATION_SPRAY | Freq: Two times a day (BID) | RESPIRATORY_TRACT | Status: DC
  Administered 2024-09-07 – 2024-09-09 (×4): 2 via RESPIRATORY_TRACT
  Filled 2024-09-07: qty 5.9

## 2024-09-07 MED ORDER — LAMOTRIGINE 25 MG PO TABS: 150.0000 mg | ORAL_TABLET | Freq: Every day | ORAL | Status: DC

## 2024-09-07 MED ORDER — ALBUTEROL SULFATE (2.5 MG/3ML) 0.083% IN NEBU
10.0000 mg/h | INHALATION_SOLUTION | Freq: Once | RESPIRATORY_TRACT | Status: AC
  Administered 2024-09-07: 10 mg/h via RESPIRATORY_TRACT
  Filled 2024-09-07: qty 3

## 2024-09-07 MED ORDER — ESCITALOPRAM OXALATE 10 MG PO TABS
20.0000 mg | ORAL_TABLET | Freq: Every day | ORAL | Status: DC
  Administered 2024-09-08 – 2024-09-09 (×2): 20 mg via ORAL
  Filled 2024-09-07 (×2): qty 2

## 2024-09-07 MED ORDER — IPRATROPIUM-ALBUTEROL 0.5-2.5 (3) MG/3ML IN SOLN
3.0000 mL | Freq: Four times a day (QID) | RESPIRATORY_TRACT | Status: DC
  Administered 2024-09-07: 3 mL via RESPIRATORY_TRACT
  Filled 2024-09-07: qty 3

## 2024-09-07 MED ORDER — METHYLPREDNISOLONE SODIUM SUCC 125 MG IJ SOLR
125.0000 mg | Freq: Once | INTRAMUSCULAR | Status: AC
  Administered 2024-09-07: 125 mg via INTRAVENOUS
  Filled 2024-09-07: qty 2

## 2024-09-07 MED ORDER — POTASSIUM CHLORIDE CRYS ER 20 MEQ PO TBCR
40.0000 meq | EXTENDED_RELEASE_TABLET | Freq: Once | ORAL | Status: AC
  Administered 2024-09-07: 40 meq via ORAL
  Filled 2024-09-07: qty 2

## 2024-09-07 MED ORDER — LEVOFLOXACIN IN D5W 500 MG/100ML IV SOLN
500.0000 mg | Freq: Once | INTRAVENOUS | Status: AC
  Administered 2024-09-07: 500 mg via INTRAVENOUS
  Filled 2024-09-07: qty 100

## 2024-09-07 NOTE — H&P (Signed)
 " History and Physical    Alice Bowers FMW:[id number] DOB: 12-05-1950 DOA: 09/07/2024  PCP: [name], NP   Patient coming from: Home  I have personally briefly reviewed patient's old medical records in [facility]  Chief Complaint: cough, sob  HPI: Alice Bowers is a 74 y.o. female with medical history significant of anxiety, right breast cancer, hypertension, hyperlipidemia, OSA on CPAP, restless leg syndrome who presented to the ER with increasing cough and shortness of breath.  She has been having symptoms since 3 days ago.  Symptoms continued to get worse prompting her to come to the ER.  She also reported diarrhea yesterday.  Appetite is poor. She is a chronic smoker.    ED Course: She was hypotensive, tachycardic, wheezy.  Tested positive for influenza A.  She received IV fluid bolus improved her blood pressure.  Lactic acid came 2.0.  Chest x-ray negative.  Patient received nebulizer, IV steroids and admitted for further management.   Review of Systems: As per HPI otherwise all other systems were reviewed and are negative.   Past Medical History:  Diagnosis Date   Anxiety    Arthritis    Cancer (HCC)    R breast cancer   Depression    Difficult intubation    Heart murmur    insignificant per MD per pt   High blood pressure    High cholesterol    History of kidney stones    Macular degeneration of left eye    Macular degeneration of right eye    Obstructive sleep apnea    has cpap   Restless leg syndrome    V tach (HCC)    hx of with sleep study laying on back per pt - 2016    Past Surgical History:  Procedure Laterality Date   ABDOMINAL HYSTERECTOMY     APPENDECTOMY     arthroscopy left shoulder     CHOLECYSTECTOMY     joint fusion bilateral thumbs     MASTECTOMY Bilateral 2019   ORIF right ankle     REPLACEMENT TOTAL KNEE BILATERAL     REVERSE SHOULDER ARTHROPLASTY Right 10/14/2021   Procedure: REVERSE SHOULDER ARTHROPLASTY;  Surgeon:  Dozier Soulier, MD;  Location: [facility];  Service: Orthopedics;  Laterality: Right;     reports that she quit smoking about 2 years ago. Her smoking use included cigarettes. She started smoking about 28 years ago. She has a 25 pack-year smoking history. She has never used smokeless tobacco. She reports current alcohol use. She reports that she does not use drugs.  Allergies[1]  Family History  Problem Relation Age of Onset   Depression Mother    Hyperlipidemia Mother    Hypertension Mother    Depression Brother    Hyperlipidemia Father    Hypertension Father    Asthma Father     Prior to Admission medications  Medication Sig Start Date End Date Taking? Authorizing Provider  Albuterol  Sulfate 108 (90 Base) MCG/ACT AEPB Inhale 1-2 puffs into the lungs 4 (four) times daily as needed (Asthma/Wheezing/ Shortness of breath).    [provider]  amLODipine (NORVASC) 2.5 MG tablet Take 2.5 mg by mouth daily.  05/01/19   [provider]  anastrozole  (ARIMIDEX ) 1 MG tablet Take 1 mg by mouth daily.    [provider]  atorvastatin  (LIPITOR) 40 MG tablet Take 40 mg by mouth daily.    [name], MD  azithromycin  (ZITHROMAX ) 250 MG tablet Take 2 tablets together  on the first day, then 1 every day until finished. 06/30/24   [name], MD  buPROPion  (WELLBUTRIN  SR) 150 MG 12 hr tablet Take 1 tablet (150 mg total) by mouth 2 (two) times daily. 07/08/24   [name], [name], MD  calcium -vitamin D 250-100 MG-UNIT tablet Take 1 tablet by mouth daily. 03/11/19   [provider]  ELIQUIS  5 MG TABS tablet Take by mouth. 05/16/22   Javaid, Adnan, MD  escitalopram  (LEXAPRO ) 20 MG tablet Take 1 tablet (20 mg total) by mouth daily. 07/08/24   [name], [name], MD  fluticasone  (FLONASE ) 50 MCG/ACT nasal spray Place 1 spray into both nostrils daily as needed for allergies or rhinitis. 12/13/19   [provider]  hydrochlorothiazide  (HYDRODIURIL ) 25 MG tablet Take 25  mg by mouth daily.    [provider]  ipratropium-albuterol  (DUONEB) 0.5-2.5 (3) MG/3ML SOLN Take 3 mLs by nebulization every 6 (six) hours as needed (asthma).    [provider]  lamoTRIgine  (LAMICTAL ) 150 MG tablet Take 1 tablet (150 mg total) by mouth daily. 07/08/24   [name], [name], MD  losartan  (COZAAR ) 100 MG tablet Take 100 mg by mouth daily.    [provider]  modafinil  (PROVIGIL ) 200 MG tablet Take 200 mg by mouth every morning. 09/09/21   [provider]  Multiple Vitamin (MULTIVITAMIN WITH MINERALS) TABS tablet Take 1 tablet by mouth daily.    [provider]  pramipexole  (MIRAPEX ) 0.5 MG tablet Take 0.5 mg by mouth in the morning and at bedtime. 09/29/21   [provider]  DOMINIC BECK 200-62.5-25 MCG/ACT AEPB Take 1 puff by mouth daily. 09/09/21   [provider]  allopurinol (ZYLOPRIM) 300 MG tablet Take 300 mg by mouth daily.  10/06/19  [name], MD    Physical Exam: Vitals:   09/07/24 1120 09/07/24 1210 09/07/24 1211 09/07/24 1213  BP: (!) 118/41   (!) 109/57  Pulse: 93     Resp: (!) 30  17   Temp:      SpO2: 100% 95%    Weight:      Height:        Constitutional: NAD, calm, comfortable Vitals:   09/07/24 1120 09/07/24 1210 09/07/24 1211 09/07/24 1213  BP: (!) 118/41   (!) 109/57  Pulse: 93     Resp: (!) 30  17   Temp:      SpO2: 100% 95%    Weight:      Height:       Dental: Alert and oriented not in any acute distress Chest: Bilateral wheezing, mild /CVS: S1, S2, no murmur, regular rhythm Abdomen: Soft, nontender Extremities: No edema  Labs on Admission: I have personally reviewed following labs and imaging studies  CBC: Recent Labs  Lab 09/07/24 1012  WBC 6.0  NEUTROABS 3.8  HGB 13.1  HCT 39.0  MCV 91.5  PLT 182   Basic Metabolic Panel: Recent Labs  Lab 09/07/24 1012 09/07/24 1043  NA 135  --   K 3.2*  --   CL 99  --   CO2 22  --   GLUCOSE 100*  --   BUN 12  --    CREATININE 0.90  --   CALCIUM  9.1  --   MG  --  1.9   GFR: Estimated Creatinine Clearance: 67.1 mL/min (by C-G formula based on SCr of 0.9 mg/dL). Liver Function Tests: Recent Labs  Lab 09/07/24 1012  AST 54*  ALT 21  ALKPHOS 106  BILITOT 0.4  PROT 6.4*  ALBUMIN 3.7   No results for input(s): LIPASE, AMYLASE in the last 168 hours. No results for input(s): AMMONIA in the last 168 hours. Coagulation Profile: No results for input(s): INR, PROTIME in the last 168 hours. Cardiac Enzymes: No results for input(s): CKTOTAL, CKMB, CKMBINDEX, TROPONINI in the last 168 hours. BNP (last 3 results) No results for input(s): PROBNP in the last 8760 hours. HbA1C: No results for input(s): HGBA1C in the last 72 hours. CBG: No results for input(s): GLUCAP in the last 168 hours. Lipid Profile: No results for input(s): CHOL, HDL, LDLCALC, TRIG, CHOLHDL, LDLDIRECT in the last 72 hours. Thyroid Function Tests: No results for input(s): TSH, T4TOTAL, FREET4, T3FREE, THYROIDAB in the last 72 hours. Anemia Panel: No results for input(s): VITAMINB12, FOLATE, FERRITIN, TIBC, IRON, RETICCTPCT in the last 72 hours. Urine analysis:    Component Value Date/Time   BILIRUBINUR small (A) 07/06/2022 1937   KETONESUR negative 07/06/2022 1937   PROTEINUR =100 (A) 07/06/2022 1937   UROBILINOGEN 1.0 07/06/2022 1937   NITRITE Negative 07/06/2022 1937   LEUKOCYTESUR Small (1+) (A) 07/06/2022 1937    Radiological Exams on Admission: DG Chest Port 1 View Result Date: 09/07/2024 EXAM: 1 VIEW XRAY OF THE CHEST 09/07/2024 10:45:30 AM COMPARISON: 06/30/2024 CLINICAL HISTORY: 74 year old female. Chest pain, dyspnea, productive cough, and wheezing. FINDINGS: LINES, TUBES AND DEVICES: Left chest port with catheter tip near the brachiocephalic confluence. LUNGS AND PLEURA: No focal pulmonary opacity. No pleural effusion. No pneumothorax. HEART AND MEDIASTINUM:  Aortic atherosclerosis. No acute abnormality of the cardiac and mediastinal silhouettes. BONES AND SOFT TISSUES: Right axillary surgical clips noted. Partially imaged right shoulder arthroplasty. No acute osseous abnormality. IMPRESSION: 1. No acute cardiopulmonary abnormality. Electronically signed by: Helayne Hurst MD 09/07/2024 11:30 AM EST RP Workstation: [id number]    EKG: Independently reviewed.   Assessment/Plan   Acute hypoxic respiratory failure secondary to below Influenza A Sepsis secondary to above Chronic smoker Likely COPD with exacerbation  - Continue with nebulizers and supportive care : IV Solu-Medrol  Out of window for Tamiflu Continue with fluid bolus and maintenance Blood pressure is normalized Currently requiring 3 L/min of supplemental oxygen   Resume home medications.  Lexapro , bupropion , Lamictal , pramipexole , Symbicort   DVT prophylaxis: lovenox   Code Status: full  Family Communication: none  Disposition Plan: home  Consults called:  Admission status: inpat   Severity of Illness: The appropriate patient status for this patient is INPATIENT. Inpatient status is judged to be reasonable and necessary in order to provide the required intensity of service to ensure the patient's safety. The patient's presenting symptoms, physical exam findings, and initial radiographic and laboratory data in the context of their chronic comorbidities is felt to place them at high risk for further clinical deterioration. Furthermore, it is not anticipated that the patient will be medically stable for discharge from the hospital within 2 midnights of admission.   * I certify that at the point of admission it is my clinical judgment that the patient will require inpatient hospital care spanning beyond 2 midnights from the point of admission due to high intensity of service, high risk for further deterioration and high frequency of surveillance required.DEWAINE Derryl Duval MD [facility]  09/07/2024, 12:19 PM         [1]  Allergies Allergen Reactions   Azo [Phenazopyridine] Hives   Pyridium [Phenazopyridine Hcl] Hives   Requip [Ropinirole Hcl] Swelling   Betadine [Povidone Iodine] Other (  See Comments)    Burn rash   Diclofenac Anxiety and Other (See Comments)    Worsens restless legs symptoms.   Demerol [Meperidine] Hives   Edta [Edetic Acid] Swelling    Throat swelling and voice to change   Macrobid [Nitrofurantoin Macrocrystal] Other (See Comments)    bronchial spasms   Ceclor [Cefaclor] Rash   Cephalosporins Rash   Dynacirc [Isradipine] Rash   Keflex [Cephalexin] Rash   Sulfa Antibiotics Rash   Voltaren [Diclofenac Sodium] Anxiety   "

## 2024-09-07 NOTE — ED Triage Notes (Signed)
 Pt c/o sob with chest pain x 3 days  Pt has audible wheezing and states she is coughing up sputum but unsure of color

## 2024-09-07 NOTE — Plan of Care (Signed)

## 2024-09-07 NOTE — ED Provider Notes (Signed)
 "  EMERGENCY DEPARTMENT AT [facility] Provider Note   CSN: [id number] Arrival date & time: 09/07/24  9062     Patient presents with: Shortness of Breath   Alice Bowers is a 74 y.o. female.   Patient is a 74 year old female who presents to Emergency Department with a chief complaint of shortness of breath and productive cough which has been ongoing for approximate the past 3 days.  Patient admits to history of asthma.  She is a previous smoker but is unsure if she has COPD.  She denies any associated fever or chills.  She has had associated chest pain.  She does have a history of hypertension, hyperlipidemia, asthma.  She denies any abdominal pain.  She does admit to some intermittent nausea as well as associated diarrhea.  There is been no associated vomiting.  She denies any dizziness, lightheadedness or syncope.  She denies any increased edema to lower extremities.   Shortness of Breath      Prior to Admission medications  Medication Sig Start Date End Date Taking? Authorizing Provider  Albuterol  Sulfate 108 (90 Base) MCG/ACT AEPB Inhale 1-2 puffs into the lungs 4 (four) times daily as needed (Asthma/Wheezing/ Shortness of breath).    [provider]  amLODipine (NORVASC) 2.5 MG tablet Take 2.5 mg by mouth daily.  05/01/19   [provider]  anastrozole  (ARIMIDEX ) 1 MG tablet Take 1 mg by mouth daily.    [provider]  atorvastatin  (LIPITOR) 40 MG tablet Take 40 mg by mouth daily.    Alice Coy, MD  azithromycin  (ZITHROMAX ) 250 MG tablet Take 2 tablets together on the first day, then 1 every day until finished. 06/30/24   Alice Ozell HERO, MD  buPROPion  (WELLBUTRIN  SR) 150 MG 12 hr tablet Take 1 tablet (150 mg total) by mouth 2 (two) times daily. 07/08/24   [name], [name], MD  calcium -vitamin D 250-100 MG-UNIT tablet Take 1 tablet by mouth daily. 03/11/19   [provider]  ELIQUIS  5 MG TABS tablet Take by mouth. 05/16/22    Javaid, Adnan, MD  escitalopram  (LEXAPRO ) 20 MG tablet Take 1 tablet (20 mg total) by mouth daily. 07/08/24   [name], [name], MD  fluticasone  (FLONASE ) 50 MCG/ACT nasal spray Place 1 spray into both nostrils daily as needed for allergies or rhinitis. 12/13/19   [provider]  hydrochlorothiazide  (HYDRODIURIL ) 25 MG tablet Take 25 mg by mouth daily.    [provider]  ipratropium-albuterol  (DUONEB) 0.5-2.5 (3) MG/3ML SOLN Take 3 mLs by nebulization every 6 (six) hours as needed (asthma).    [provider]  lamoTRIgine  (LAMICTAL ) 150 MG tablet Take 1 tablet (150 mg total) by mouth daily. 07/08/24   [name], [name], MD  losartan  (COZAAR ) 100 MG tablet Take 100 mg by mouth daily.    [provider]  modafinil  (PROVIGIL ) 200 MG tablet Take 200 mg by mouth every morning. 09/09/21   [provider]  Multiple Vitamin (MULTIVITAMIN WITH MINERALS) TABS tablet Take 1 tablet by mouth daily.    [provider]  pramipexole  (MIRAPEX ) 0.5 MG tablet Take 0.5 mg by mouth in the morning and at bedtime. 09/29/21   [provider]  Alice Bowers 200-62.5-25 MCG/ACT AEPB Take 1 puff by mouth daily. 09/09/21   [provider]  allopurinol (ZYLOPRIM) 300 MG tablet Take 300 mg by mouth daily.  10/06/19  Alice Coy, MD    Allergies: Azo [phenazopyridine], Pyridium [phenazopyridine hcl], Requip [ropinirole  hcl], Betadine [povidone iodine], Diclofenac, Demerol [meperidine], Edta [edetic acid], Macrobid [nitrofurantoin macrocrystal], Ceclor [cefaclor], Cephalosporins, Dynacirc [isradipine], Keflex [cephalexin], Sulfa antibiotics, and Voltaren [diclofenac sodium]    Review of Systems  Respiratory:  Positive for shortness of breath.   All other systems reviewed and are negative.   Updated Vital Signs BP (!) 94/54   Pulse 85   Temp 99 F (37.2 C)   Resp (!) 34   Ht 5' 4 (1.626 m)   Wt 108.9 kg   SpO2 95%   BMI 41.20 kg/m   Physical  Exam Vitals and nursing note reviewed.  Constitutional:      General: She is not in acute distress.    Appearance: Normal appearance. She is not ill-appearing.  HENT:     Head: Normocephalic and atraumatic.     Nose: Nose normal.     Mouth/Throat:     Mouth: Mucous membranes are moist.  Eyes:     Extraocular Movements: Extraocular movements intact.     Conjunctiva/sclera: Conjunctivae normal.     Pupils: Pupils are equal, round, and reactive to light.  Cardiovascular:     Rate and Rhythm: Normal rate and regular rhythm.     Pulses: Normal pulses.     Heart sounds: Normal heart sounds. No murmur heard.    No gallop.  Pulmonary:     Effort: Pulmonary effort is normal. Tachypnea present.     Breath sounds: Wheezing present. No decreased breath sounds, rhonchi or rales.  Chest:     Chest wall: No tenderness.  Abdominal:     General: Abdomen is flat. Bowel sounds are normal.     Palpations: Abdomen is soft.     Tenderness: There is no abdominal tenderness. There is no guarding.  Musculoskeletal:        General: Normal range of motion.     Cervical back: Normal range of motion and neck supple.     Right lower leg: Edema present.     Left lower leg: Edema present.  Skin:    General: Skin is warm and dry.     Findings: No rash.  Neurological:     General: No focal deficit present.     Mental Status: She is alert and oriented to person, place, and time. Mental status is at baseline.  Psychiatric:        Mood and Affect: Mood normal.        Behavior: Behavior normal.        Thought Content: Thought content normal.        Judgment: Judgment normal.     (all labs ordered are listed, but only abnormal results are displayed) Labs Reviewed  COMPREHENSIVE METABOLIC PANEL WITH GFR - Abnormal; Notable for the following components:      Result Value   Potassium 3.2 (*)    Glucose, Bld 100 (*)    Total Protein 6.4 (*)    AST 54 (*)    All other components within normal limits   RESP PANEL BY RT-PCR (RSV, FLU A&B, COVID)  RVPGX2  CULTURE, BLOOD (ROUTINE X 2)  CULTURE, BLOOD (ROUTINE X 2)  CBC WITH DIFFERENTIAL/PLATELET  LACTIC ACID, PLASMA  LACTIC ACID, PLASMA  MAGNESIUM   TROPONIN T, HIGH SENSITIVITY    EKG: EKG Interpretation Date/Time:  Saturday September 07 2024 09:55:52 EST Ventricular Rate:  99 PR Interval:  198 QRS Duration:  139 QT Interval:  369 QTC Calculation: 474 R Axis:   266  Text Interpretation: Sinus rhythm  RBBB and LAFB ST elevation,old Confirmed by Suzette Pac [phone]) on 09/07/2024 10:01:15 AM  Radiology: No results found.   .Critical Care  Performed by: Daralene Lonni BIRCH, PA-C Authorized by: Daralene Lonni BIRCH, PA-C   Critical care provider statement:    Critical care time (minutes):  45   Critical care was necessary to treat or prevent imminent or life-threatening deterioration of the following conditions:  Respiratory failure   Critical care was time spent personally by me on the following activities:  Development of treatment plan with patient or surrogate, discussions with consultants, evaluation of patient's response to treatment, examination of patient, ordering and review of laboratory studies, ordering and review of radiographic studies, ordering and performing treatments and interventions, pulse oximetry, re-evaluation of patient's condition and review of old charts   I assumed direction of critical care for this patient from another provider in my specialty: no     Care discussed with: admitting provider      Medications Ordered in the ED  magnesium  sulfate IVPB 2 g 50 mL (2 g Intravenous New Bag/Given 09/07/24 1029)  potassium chloride  SA (KLOR-CON  M) CR tablet 40 mEq (has no administration in time range)  albuterol  (PROVENTIL ) (2.5 MG/3ML) 0.083% nebulizer solution (10 mg/hr Nebulization Given 09/07/24 1030)  methylPREDNISolone  sodium succinate (SOLU-MEDROL ) 125 mg/2 mL injection 125 mg (125 mg Intravenous Given  09/07/24 1018)  sodium chloride  0.9 % bolus 1,000 mL (1,000 mLs Intravenous New Bag/Given 09/07/24 1024)  ipratropium-albuterol  (DUONEB) 0.5-2.5 (3) MG/3ML nebulizer solution 3 mL (3 mLs Nebulization Given 09/07/24 1035)                                    Medical Decision Making Amount and/or Complexity of Data Reviewed Labs: ordered. Radiology: ordered.  Risk Prescription drug management. Decision regarding hospitalization.   This patient presents to the ED for concern of dyspnea, productive cough, generalized weakness, this involves an extensive number of treatment options, and is a complaint that carries with it a high risk of complications and morbidity.  The differential diagnosis includes COPD, CHF, asthma, pneumonia, sepsis, ACS, pulmonary embolus, pericarditis, myocarditis, endocarditis, pneumothorax, hemothorax   Co morbidities that complicate the patient evaluation  Asthma, high cholesterol, hypertension   Additional history obtained:  Additional history obtained from medical records External records from outside source obtained and reviewed including medical records   Lab Tests:  I Ordered, and personally interpreted labs.  The pertinent results include: No leukocytosis, no anemia, normal kidney function liver function, mild hypokalemia, normal magnesium , negative lactic acid, negative troponin, positive for influenza A   Imaging Studies ordered:  I ordered imaging studies including chest x-ray I independently visualized and interpreted imaging which showed no acute cardiopulmonary process I agree with the radiologist interpretation   Cardiac Monitoring: / EKG:  The patient was maintained on a cardiac monitor.  I personally viewed and interpreted the cardiac monitored which showed an underlying rhythm of: Normal sinus rhythm, right bundle branch block, no ST/T wave changes, no ischemic changes, no STEMI, EKG consistent with previous   Consultations  Obtained:  I requested consultation with the hospitalist,  and discussed lab and imaging findings as well as pertinent plan - they recommend: Admission   Problem List / ED Course / Critical interventions / Medication management  Patient does remain stable at this time.  She did become hypotensive on initial presentation and has been given IV fluids with  great improvement in her blood pressure.  She has positive for influenza A.  She continues to have ongoing wheezing despite treatment in the emergency department.  She has no other clear indication for fluid overload at this point.  Did initially give dose of Levaquin  as patient was hypotensive, hypoxic and tachypneic on presentation to cover for sepsis.  Will avoid full sepsis fluid bolus to prevent fluid overload in this patient.  Have discussed patient case with Dr. Sigel with the hospitalist service who has excepted for admission. I ordered medication including IV fluids, Levaquin , potassium, albuterol , DuoNeb, magnesium , Solu-Medrol  for asthma versus COPD exacerbation, productive cough, hypotension, hypoxia respiratory failure Reevaluation of the patient after these medicines showed that the patient improved I have reviewed the patients home medicines and have made adjustments as needed   Social Determinants of Health:  None   Test / Admission - Considered:  Admission     Final diagnoses:  Bronchitis  Peripheral edema    ED Discharge Orders     None          Daralene Lonni JONETTA DEVONNA 09/07/24 1232    Suzette Pac, MD 09/07/24 1819  "

## 2024-09-07 NOTE — ED Notes (Signed)
 Nurse spoke with CCMD.

## 2024-09-07 NOTE — ED Notes (Signed)
 Pt put in [place] d/t bp. EDP aware. Verbal orders received. Pt verbalized she took her bp meds this morning.

## 2024-09-08 DIAGNOSIS — J9601 Acute respiratory failure with hypoxia: Secondary | ICD-10-CM | POA: Diagnosis not present

## 2024-09-08 LAB — BASIC METABOLIC PANEL WITH GFR
Anion gap: 11 (ref 5–15)
BUN: 14 mg/dL (ref 8–23)
CO2: 22 mmol/L (ref 22–32)
Calcium: 8.9 mg/dL (ref 8.9–10.3)
Chloride: 104 mmol/L (ref 98–111)
Creatinine, Ser: 0.9 mg/dL (ref 0.44–1.00)
GFR, Estimated: >60 mL/min
Glucose, Bld: 139 mg/dL — ABNORMAL HIGH (ref 70–99)
Potassium: 3.6 mmol/L (ref 3.5–5.1)
Sodium: 137 mmol/L (ref 135–145)

## 2024-09-08 MED ORDER — LEVOTHYROXINE SODIUM 25 MCG PO TABS
25.0000 ug | ORAL_TABLET | Freq: Every day | ORAL | Status: DC
  Administered 2024-09-08 – 2024-09-09 (×2): 25 ug via ORAL
  Filled 2024-09-08 (×2): qty 1

## 2024-09-08 MED ORDER — APIXABAN 5 MG PO TABS
5.0000 mg | ORAL_TABLET | Freq: Two times a day (BID) | ORAL | Status: DC
  Administered 2024-09-08 – 2024-09-09 (×3): 5 mg via ORAL
  Filled 2024-09-08 (×3): qty 1

## 2024-09-08 NOTE — Plan of Care (Signed)

## 2024-09-08 NOTE — Progress Notes (Signed)
 Transition of Care Department [facility]) has reviewed patient and no TOC needs have been identified at this time, although CM noted patient is currently requiring supplemental O2 at 3L and may need Home O2 arranged at discharge if not weaned. We will continue to monitor patient advancement through interdisciplinary progression rounds. If additional patient transition needs arise, please place a TOC consult    09/08/24 1338  TOC Brief Assessment  Insurance and Status Reviewed  Patient has primary care physician Yes  Home environment has been reviewed Home Alone  Prior level of function: Independent  Prior/Current Home Services No current home services  Social Drivers of Health Review SDOH reviewed no interventions necessary  Readmission risk has been reviewed Yes  Transition of care needs no transition of care needs at this time

## 2024-09-08 NOTE — Progress Notes (Signed)
 " PROGRESS NOTE    Alice Bowers  FMW:[id number] DOB: 10-01-1950 DOA: 09/07/2024 PCP: [name], NP   Brief Narrative:     Alice Bowers is a 74 y.o. female with medical history significant of anxiety, right breast cancer, hypertension, hyperlipidemia, OSA on CPAP, restless leg syndrome who presented to the ER with increasing cough and shortness of breath.  She has been having symptoms since 3 days ago.  Symptoms continued to get worse prompting her to come to the ER.  She also reported diarrhea yesterday.  Appetite is poor. She is a chronic smoker.       ED Course: She was hypotensive, tachycardic, wheezy.  Tested positive for influenza A.  She received IV fluid bolus improved her blood pressure.  Lactic acid came 2.0.  Chest x-ray negative.  Patient received nebulizer, IV steroids and admitted for further management.  Assessment & Plan:   Principal Problem:   Acute hypoxic respiratory failure (HCC) Active Problems:   OSA on CPAP   Mixed hyperlipidemia   Benign essential hypertension   Influenza A   Sepsis (HCC)    Acute hypoxic respiratory failure secondary to below Influenza A Sepsis secondary to above Chronic smoker Likely COPD with exacerbation   - Continue with nebulizers and supportive care : IV Solu-Medrol  Out of window for Tamiflu Continue with fluid bolus and maintenance Blood pressure is normalized Currently requiring 3 L/min of supplemental oxygen    Resume home medications.  Lexapro , bupropion , Lamictal , pramipexole , Symbicort    DVT prophylaxis: lovenox   Code Status: full  Family Communication: none  Disposition Plan: home  Consults called:  Admission status: inpat    Severity of Illness: The appropriate patient status for this patient is INPATIENT. Inpatient status is judged to be reasonable and necessary in order to provide the required intensity of service to ensure the patient's safety. The patient's presenting symptoms, physical exam  findings, and initial radiographic and laboratory data in the context of their chronic comorbidities is felt to place them at high risk for further clinical deterioration. Furthermore, it is not anticipated that the patient will be medically stable for discharge from the hospital within 2 midnights of admission.    * I certify that at the point of admission it is my clinical judgment that the patient will require inpatient hospital care spanning beyond 2 midnights from the point of admission due to high intensity of service, high risk for further deterioration and high frequency of surveillance required.*    Subjective:  Patient feels better.  Still has some cough.  Still requiring supplemental oxygen  2 L/min.  Denies shortness of breath.  Not wheezing on exam but diminished bilaterally.  Objective: Vitals:   09/08/24 0640 09/08/24 0817 09/08/24 1432 09/08/24 1558  BP:    (!) 162/84  Pulse:    74  Resp:    18  Temp:    (!) 97.3 F (36.3 C)  TempSrc:    Oral  SpO2: 95% 92% 96% 96%  Weight:      Height:        Intake/Output Summary (Last 24 hours) at 09/08/2024 1746 Last data filed at 09/08/2024 1116 Gross per 24 hour  Intake 1015.52 ml  Output 150 ml  Net 865.52 ml   Filed Weights   09/07/24 0950 09/07/24 1414  Weight: 108.9 kg 110.3 kg    Examination:  General: Alert, oriented Chest: Bilateral coarse crackles, no wheezing today Chest: S1, S2, no murmur Abdomen: Soft, nontender Extremities: No edema  Data Reviewed: I have personally reviewed following labs and imaging studies  CBC: Recent Labs  Lab 09/07/24 1012  WBC 6.0  NEUTROABS 3.8  HGB 13.1  HCT 39.0  MCV 91.5  PLT 182   Basic Metabolic Panel: Recent Labs  Lab 09/07/24 1012 09/07/24 1043 09/08/24 0424  NA 135  --  137  K 3.2*  --  3.6  CL 99  --  104  CO2 22  --  22  GLUCOSE 100*  --  139*  BUN 12  --  14  CREATININE 0.90  --  0.90  CALCIUM  9.1  --  8.9  MG  --  1.9  --    GFR: Estimated  Creatinine Clearance: 67.6 mL/min (by C-G formula based on SCr of 0.9 mg/dL). Liver Function Tests: Recent Labs  Lab 09/07/24 1012  AST 54*  ALT 21  ALKPHOS 106  BILITOT 0.4  PROT 6.4*  ALBUMIN 3.7   No results for input(s): LIPASE, AMYLASE in the last 168 hours. No results for input(s): AMMONIA in the last 168 hours. Coagulation Profile: No results for input(s): INR, PROTIME in the last 168 hours. Cardiac Enzymes: No results for input(s): CKTOTAL, CKMB, CKMBINDEX, TROPONINI in the last 168 hours. BNP (last 3 results) No results for input(s): PROBNP in the last 8760 hours. HbA1C: No results for input(s): HGBA1C in the last 72 hours. CBG: No results for input(s): GLUCAP in the last 168 hours. Lipid Profile: No results for input(s): CHOL, HDL, LDLCALC, TRIG, CHOLHDL, LDLDIRECT in the last 72 hours. Thyroid Function Tests: No results for input(s): TSH, T4TOTAL, FREET4, T3FREE, THYROIDAB in the last 72 hours. Anemia Panel: No results for input(s): VITAMINB12, FOLATE, FERRITIN, TIBC, IRON, RETICCTPCT in the last 72 hours. Sepsis Labs: Recent Labs  Lab 09/07/24 1038 09/07/24 1145  LATICACIDVEN 1.5 2.0*    Recent Results (from the past 240 hours)  Resp panel by RT-PCR (RSV, Flu A&B, Covid) Anterior Nasal Swab     Status: Abnormal   Collection Time: 09/07/24  9:53 AM   Specimen: Anterior Nasal Swab  Result Value Ref Range Status   SARS Coronavirus 2 by RT PCR NEGATIVE NEGATIVE Final    Comment: (NOTE) SARS-CoV-2 target nucleic acids are NOT DETECTED.  The SARS-CoV-2 RNA is generally detectable in upper respiratory specimens during the acute phase of infection. The lowest concentration of SARS-CoV-2 viral copies this assay can detect is 138 copies/mL. A negative result does not preclude SARS-Cov-2 infection and should not be used as the sole basis for treatment or other patient management decisions. A negative result  may occur with  improper specimen collection/handling, submission of specimen other than nasopharyngeal swab, presence of viral mutation(s) within the areas targeted by this assay, and inadequate number of viral copies(<138 copies/mL). A negative result must be combined with clinical observations, patient history, and epidemiological information. The expected result is Negative.  Fact Sheet for Patients:  bloggercourse.com  Fact Sheet for Healthcare Providers:  seriousbroker.it  This test is no t yet approved or cleared by the [place]  FDA and  has been authorized for detection and/or diagnosis of SARS-CoV-2 by FDA under an Emergency Use Authorization (EUA). This EUA will remain  in effect (meaning this test can be used) for the duration of the COVID-19 declaration under Section 564(b)(1) of the Act, 21 U.S.C.section 360bbb-3(b)(1), unless the authorization is terminated  or revoked sooner.       Influenza A by PCR POSITIVE (A) NEGATIVE Final   Influenza B  by PCR NEGATIVE NEGATIVE Final    Comment: (NOTE) The Xpert Xpress SARS-CoV-2/FLU/RSV plus assay is intended as an aid in the diagnosis of influenza from Nasopharyngeal swab specimens and should not be used as a sole basis for treatment. Nasal washings and aspirates are unacceptable for Xpert Xpress SARS-CoV-2/FLU/RSV testing.  Fact Sheet for Patients: bloggercourse.com  Fact Sheet for Healthcare Providers: seriousbroker.it  This test is not yet approved or cleared by the [place]  FDA and has been authorized for detection and/or diagnosis of SARS-CoV-2 by FDA under an Emergency Use Authorization (EUA). This EUA will remain in effect (meaning this test can be used) for the duration of the COVID-19 declaration under Section 564(b)(1) of the Act, 21 U.S.C. section 360bbb-3(b)(1), unless the authorization is terminated  or revoked.     Resp Syncytial Virus by PCR NEGATIVE NEGATIVE Final    Comment: (NOTE) Fact Sheet for Patients: bloggercourse.com  Fact Sheet for Healthcare Providers: seriousbroker.it  This test is not yet approved or cleared by the [place]  FDA and has been authorized for detection and/or diagnosis of SARS-CoV-2 by FDA under an Emergency Use Authorization (EUA). This EUA will remain in effect (meaning this test can be used) for the duration of the COVID-19 declaration under Section 564(b)(1) of the Act, 21 U.S.C. section 360bbb-3(b)(1), unless the authorization is terminated or revoked.  Performed at [facility], [address] [place], [place] [address]   Culture, blood (routine x 2)     Status: None (Preliminary result)   Collection Time: 09/07/24 11:17 AM   Specimen: BLOOD  Result Value Ref Range Status   Specimen Description BLOOD LEFT ANTECUBITAL  Final   Special Requests AEROBIC BOTTLE ONLY Blood Culture adequate volume  Final   Culture   Final    NO GROWTH < 24 HOURS Performed at [facility], [address], [place], [place] [address]    Report Status PENDING  Incomplete  Culture, blood (routine x 2)     Status: None (Preliminary result)   Collection Time: 09/07/24 11:17 AM   Specimen: BLOOD  Result Value Ref Range Status   Specimen Description BLOOD BLOOD LEFT HAND  Final   Special Requests AEROBIC BOTTLE ONLY Blood Culture adequate volume  Final   Culture   Final    NO GROWTH < 24 HOURS Performed at [facility], [address] [place], [place] [address]    Report Status PENDING  Incomplete         Radiology Studies: DG Chest Port 1 View Result Date: 09/07/2024 EXAM: 1 VIEW XRAY OF THE CHEST 09/07/2024 10:45:30 AM COMPARISON: 06/30/2024 CLINICAL HISTORY: 74 year old female. Chest pain, dyspnea, productive cough, and wheezing. FINDINGS: LINES, TUBES AND DEVICES: Left chest port with catheter tip near  the brachiocephalic confluence. LUNGS AND PLEURA: No focal pulmonary opacity. No pleural effusion. No pneumothorax. HEART AND MEDIASTINUM: Aortic atherosclerosis. No acute abnormality of the cardiac and mediastinal silhouettes. BONES AND SOFT TISSUES: Right axillary surgical clips noted. Partially imaged right shoulder arthroplasty. No acute osseous abnormality. IMPRESSION: 1. No acute cardiopulmonary abnormality. Electronically signed by: Helayne Hurst MD 09/07/2024 11:30 AM EST RP Workstation: [id number]        Scheduled Meds:  apixaban   5 mg Oral BID   budesonide -glycopyrrolate -formoterol   2 puff Inhalation BID   buPROPion   150 mg Oral BID   escitalopram   20 mg Oral Daily   ipratropium-albuterol   3 mL Nebulization TID   lamoTRIgine   150 mg Oral QHS   levothyroxine   25 mcg Oral  Q0600   methylPREDNISolone  (SOLU-MEDROL ) injection  40 mg Intravenous Q12H   pramipexole   0.5 mg Oral BID   Continuous Infusions:        Alice Dolloff, MD [facility] 09/08/2024, 5:46 PM   "

## 2024-09-08 NOTE — Plan of Care (Signed)
   Problem: Education: Goal: Knowledge of General Education information will improve Description Including pain rating scale, medication(s)/side effects and non-pharmacologic comfort measures Outcome: Progressing   Problem: Health Behavior/Discharge Planning: Goal: Ability to manage health-related needs will improve Outcome: Progressing

## 2024-09-09 ENCOUNTER — Telehealth (HOSPITAL_COMMUNITY): Payer: Self-pay

## 2024-09-09 ENCOUNTER — Other Ambulatory Visit (HOSPITAL_COMMUNITY): Payer: Self-pay

## 2024-09-09 DIAGNOSIS — J9601 Acute respiratory failure with hypoxia: Secondary | ICD-10-CM | POA: Diagnosis not present

## 2024-09-09 MED ORDER — ANASTROZOLE 1 MG PO TABS
1.0000 mg | ORAL_TABLET | Freq: Every day | ORAL | Status: DC
  Administered 2024-09-09: 1 mg via ORAL
  Filled 2024-09-09 (×2): qty 1

## 2024-09-09 MED ORDER — ATORVASTATIN CALCIUM 40 MG PO TABS
40.0000 mg | ORAL_TABLET | Freq: Every day | ORAL | Status: DC
  Administered 2024-09-09: 40 mg via ORAL
  Filled 2024-09-09: qty 1

## 2024-09-09 MED ORDER — INFLUENZA VAC SPLIT HIGH-DOSE 0.5 ML IM SUSY: 0.5000 mL | PREFILLED_SYRINGE | INTRAMUSCULAR | Status: DC

## 2024-09-09 MED ORDER — LOSARTAN POTASSIUM 50 MG PO TABS
100.0000 mg | ORAL_TABLET | Freq: Every day | ORAL | Status: DC
  Administered 2024-09-09: 100 mg via ORAL
  Filled 2024-09-09: qty 2

## 2024-09-09 MED ORDER — PREDNISONE 10 MG PO TABS: ORAL_TABLET | ORAL | 0 refills | Status: DC

## 2024-09-09 MED ORDER — INFLUENZA VAC SPLIT HIGH-DOSE 0.5 ML IM SUSY
0.5000 mL | PREFILLED_SYRINGE | Freq: Once | INTRAMUSCULAR | Status: AC
  Administered 2024-09-09: 0.5 mL via INTRAMUSCULAR
  Filled 2024-09-09: qty 0.5

## 2024-09-09 NOTE — Progress Notes (Signed)
 Pt discharged to personal vehicle in Emergency Room parking lot. Pt alert and oriented, states she understands risks of driving self home and that she is okay to drive home. I was worse off driving here than I am now. Chart[employer] Loss Adjuster discussed with pt personally of risks of driving home and preference of the hospital that she were to have someone come pick her up.

## 2024-09-09 NOTE — Telephone Encounter (Signed)
 Pharmacy Patient Advocate Encounter  Insurance verification completed.    The patient is insured through [employer]. Patient has Medicare and is not eligible for a copay card, but may be able to apply for patient assistance or Medicare RX Payment Plan (Patient Must reach out to their plan, if eligible for payment plan), if available.    Ran test claim for Eliquis 5mg  tablet and the current 30 day co-pay is $249 due to deductible.   This test claim was processed through [facility]- copay amounts may vary at other pharmacies due to pharmacy/plan contracts, or as the patient moves through the different stages of their insurance plan.

## 2024-09-09 NOTE — Discharge Summary (Signed)
 Physician Discharge Summary  Alice Bowers FMW:[id number] DOB: 24-Sep-1950 DOA: 09/07/2024  PCP: Alice Izetta GRADE, NP  Admit date: 09/07/2024 Discharge date: 09/09/2024  Admitted From: Home Disposition: Home  Recommendations for Outpatient Follow-up:  Follow up with PCP in 1 week  Follow up in ED if symptoms worsen or new appear   Home Health: No Equipment/Devices: None  Discharge Condition: Stable CODE STATUS: Full Diet recommendation: Heart healthy  Brief/Interim Summary:  Alice Bowers is a 74 y.o. female with medical history significant of anxiety, right breast cancer, hypertension, hyperlipidemia, OSA on CPAP, restless leg syndrome who presented to the ER with increasing cough and shortness of breath.  She has been having symptoms since 3 days prior to presentation in the ER. Symptoms continued to get worse prompting her to come to the ER.  She also reported diarrhea 1 day prior. Appetite is poor. She is a chronic smoker.    ED Course: She was hypotensive, tachycardic, wheezy.  Tested positive for influenza A.  She received IV fluid bolus improved her blood pressure.  Lactic acid came 2.0.  Chest x-ray negative.  Patient received nebulizer, IV steroids and admitted for further management.   Assessment & Plan:    Acute hypoxic respiratory failure secondary to below Influenza A Sepsis secondary to above Chronic smoker Asthma exacerbation   - Treated with scheduled nebulizers, IV Solu-Medrol  and supportive care.  Initially required 3 L/min of supplemental oxygen  but Was able to wean off prior to discharge Out of window for Tamiflu Discharged on prednisone  taper 30 mg daily for 3 days followed by 20 mg daily for 3 days followed by 10 mg daily for 3 days.  Other Chronic medical issues remained stable.  Patient continued on home medications.  Lexapro , bupropion , Lamictal , pramipexole , Symbicort , levothyroxine .  Patient advised to quit smoking.  Follow-up with PCP in 1  week.  Discharge Diagnoses:  Principal Problem:   Acute hypoxic respiratory failure (HCC) Active Problems:   Asthma exacerbation   OSA on CPAP   Mixed hyperlipidemia   Restless legs syndrome (RLS)   Tobacco dependence   Benign essential hypertension   Influenza A   Sepsis [facility])    Discharge Instructions  Discharge Instructions     Call MD for:  difficulty breathing, headache or visual disturbances   Complete by: As directed    Diet Carb Modified   Complete by: As directed    Discharge instructions   Complete by: As directed    1.  Complete a short steroid taper as prescribed. 2.  Follow-up with PCP in 1 week.   Increase activity slowly   Complete by: As directed       Allergies as of 09/09/2024       Reactions   Azo [phenazopyridine] Hives   Pyridium [phenazopyridine Hcl] Hives   Requip [ropinirole Hcl] Swelling   Betadine [povidone Iodine] Other (See Comments)   Burn rash   Diclofenac Anxiety, Other (See Comments)   Worsens restless legs symptoms.   Demerol [meperidine] Hives   Edta [edetic Acid] Swelling   Throat swelling and voice to change   Macrobid [nitrofurantoin Macrocrystal] Other (See Comments)   bronchial spasms   Ceclor [cefaclor] Rash   Cephalosporins Rash   Dynacirc [isradipine] Rash   Keflex [cephalexin] Rash   Sulfa Antibiotics Rash   Voltaren [diclofenac Sodium] Anxiety        Medication List     STOP taking these medications    amLODipine 2.5 MG tablet Commonly known as:  NORVASC   azithromycin  250 MG tablet Commonly known as: ZITHROMAX        TAKE these medications    pramipexole  0.5 MG tablet Commonly known as: MIRAPEX  Take 0.5 mg by mouth in the morning and at bedtime. The timing of this medication is very important.   Albuterol  Sulfate 108 (90 Base) MCG/ACT Aepb Commonly known as: PROAIR  RESPICLICK Inhale 1-2 puffs into the lungs 4 (four) times daily as needed (Asthma/Wheezing/ Shortness of breath).   anastrozole  1  MG tablet Commonly known as: ARIMIDEX  Take 1 mg by mouth daily.   atorvastatin  40 MG tablet Commonly known as: LIPITOR Take 40 mg by mouth daily.   buPROPion  150 MG 12 hr tablet Commonly known as: WELLBUTRIN  SR Take 1 tablet (150 mg total) by mouth 2 (two) times daily.   calcium -vitamin D 250-100 MG-UNIT tablet Take 1 tablet by mouth daily.   Eliquis  5 MG Tabs tablet Generic drug: apixaban  Take by mouth.   escitalopram  20 MG tablet Commonly known as: LEXAPRO  Take 1 tablet (20 mg total) by mouth daily.   fluticasone  50 MCG/ACT nasal spray Commonly known as: FLONASE  Place 1 spray into both nostrils daily as needed for allergies or rhinitis.   hydrochlorothiazide  25 MG tablet Commonly known as: HYDRODIURIL  Take 25 mg by mouth daily.   ipratropium-albuterol  0.5-2.5 (3) MG/3ML Soln Commonly known as: DUONEB Take 3 mLs by nebulization every 6 (six) hours as needed (asthma).   lamoTRIgine  150 MG tablet Commonly known as: LAMICTAL  Take 1 tablet (150 mg total) by mouth daily.   levothyroxine  25 MCG tablet Commonly known as: SYNTHROID  Take 25 mcg by mouth daily before breakfast.   losartan  100 MG tablet Commonly known as: COZAAR  Take 100 mg by mouth daily.   modafinil  200 MG tablet Commonly known as: PROVIGIL  Take 200 mg by mouth every morning.   multivitamin with minerals Tabs tablet Take 1 tablet by mouth daily.   predniSONE  10 MG tablet Commonly known as: DELTASONE  30 mg daily for 3 days followed by 20 mg daily for 3 days followed by 10 mg daily for 3 days then stop   Trelegy Ellipta 200-62.5-25 MCG/ACT Aepb Generic drug: Fluticasone -Umeclidin-Vilant Take 1 puff by mouth daily.        Follow-up Information     Alice Izetta GRADE, NP. Schedule an appointment as soon as possible for a visit in 1 week(s).   Specialty: Family Medicine Contact information: [address] [place] [address] [phone]                 Allergies[1]  Consultations:    Procedures/Studies: DG Chest Port 1 View Result Date: 09/07/2024 EXAM: 1 VIEW XRAY OF THE CHEST 09/07/2024 10:45:30 AM COMPARISON: 06/30/2024 CLINICAL HISTORY: 74 year old female. Chest pain, dyspnea, productive cough, and wheezing. FINDINGS: LINES, TUBES AND DEVICES: Left chest port with catheter tip near the brachiocephalic confluence. LUNGS AND PLEURA: No focal pulmonary opacity. No pleural effusion. No pneumothorax. HEART AND MEDIASTINUM: Aortic atherosclerosis. No acute abnormality of the cardiac and mediastinal silhouettes. BONES AND SOFT TISSUES: Right axillary surgical clips noted. Partially imaged right shoulder arthroplasty. No acute osseous abnormality. IMPRESSION: 1. No acute cardiopulmonary abnormality. Electronically signed by: Helayne Hurst MD 09/07/2024 11:30 AM EST RP Workstation: [id number]      Subjective: Feels better, took a shower today and remains on room air.  Discharge Exam: Vitals:   09/09/24 0730 09/09/24 0748  BP: (!) 177/66   Pulse:    Resp:    Temp:  SpO2: 93% 95%    General: Pt is alert, awake, not in acute distress Cardiovascular: rate controlled, S1/S2 + Respiratory: bilateral decreased breath sounds at bases, mild bibasilar crackles, no wheezing Abdominal: Soft, NT, ND, bowel sounds + Extremities: Chronic nonpitting edema bilaterally lower extremities   The results of significant diagnostics from this hospitalization (including imaging, microbiology, ancillary and laboratory) are listed below for reference.     Microbiology: Recent Results (from the past 240 hours)  Resp panel by RT-PCR (RSV, Flu A&B, Covid) Anterior Nasal Swab     Status: Abnormal   Collection Time: 09/07/24  9:53 AM   Specimen: Anterior Nasal Swab  Result Value Ref Range Status   SARS Coronavirus 2 by RT PCR NEGATIVE NEGATIVE Final    Comment: (NOTE) SARS-CoV-2 target nucleic acids are NOT DETECTED.  The SARS-CoV-2 RNA is generally  detectable in upper respiratory specimens during the acute phase of infection. The lowest concentration of SARS-CoV-2 viral copies this assay can detect is 138 copies/mL. A negative result does not preclude SARS-Cov-2 infection and should not be used as the sole basis for treatment or other patient management decisions. A negative result may occur with  improper specimen collection/handling, submission of specimen other than nasopharyngeal swab, presence of viral mutation(s) within the areas targeted by this assay, and inadequate number of viral copies(<138 copies/mL). A negative result must be combined with clinical observations, patient history, and epidemiological information. The expected result is Negative.  Fact Sheet for Patients:  bloggercourse.com  Fact Sheet for Healthcare Providers:  seriousbroker.it  This test is no t yet approved or cleared by the [place]  FDA and  has been authorized for detection and/or diagnosis of SARS-CoV-2 by FDA under an Emergency Use Authorization (EUA). This EUA will remain  in effect (meaning this test can be used) for the duration of the COVID-19 declaration under Section 564(b)(1) of the Act, 21 U.S.C.section 360bbb-3(b)(1), unless the authorization is terminated  or revoked sooner.       Influenza A by PCR POSITIVE (A) NEGATIVE Final   Influenza B by PCR NEGATIVE NEGATIVE Final    Comment: (NOTE) The Xpert Xpress SARS-CoV-2/FLU/RSV plus assay is intended as an aid in the diagnosis of influenza from Nasopharyngeal swab specimens and should not be used as a sole basis for treatment. Nasal washings and aspirates are unacceptable for Xpert Xpress SARS-CoV-2/FLU/RSV testing.  Fact Sheet for Patients: bloggercourse.com  Fact Sheet for Healthcare Providers: seriousbroker.it  This test is not yet approved or cleared by the [place]   FDA and has been authorized for detection and/or diagnosis of SARS-CoV-2 by FDA under an Emergency Use Authorization (EUA). This EUA will remain in effect (meaning this test can be used) for the duration of the COVID-19 declaration under Section 564(b)(1) of the Act, 21 U.S.C. section 360bbb-3(b)(1), unless the authorization is terminated or revoked.     Resp Syncytial Virus by PCR NEGATIVE NEGATIVE Final    Comment: (NOTE) Fact Sheet for Patients: bloggercourse.com  Fact Sheet for Healthcare Providers: seriousbroker.it  This test is not yet approved or cleared by the [place]  FDA and has been authorized for detection and/or diagnosis of SARS-CoV-2 by FDA under an Emergency Use Authorization (EUA). This EUA will remain in effect (meaning this test can be used) for the duration of the COVID-19 declaration under Section 564(b)(1) of the Act, 21 U.S.C. section 360bbb-3(b)(1), unless the authorization is terminated or revoked.  Performed at [facility], [address], [place], [place] [address]  Culture, blood (routine x 2)     Status: None (Preliminary result)   Collection Time: 09/07/24 11:17 AM   Specimen: BLOOD  Result Value Ref Range Status   Specimen Description BLOOD LEFT ANTECUBITAL  Final   Special Requests AEROBIC BOTTLE ONLY Blood Culture adequate volume  Final   Culture   Final    NO GROWTH 2 DAYS Performed at [facility], [address] [place], [place] [address]    Report Status PENDING  Incomplete  Culture, blood (routine x 2)     Status: None (Preliminary result)   Collection Time: 09/07/24 11:17 AM   Specimen: BLOOD  Result Value Ref Range Status   Specimen Description BLOOD BLOOD LEFT HAND  Final   Special Requests AEROBIC BOTTLE ONLY Blood Culture adequate volume  Final   Culture   Final    NO GROWTH 2 DAYS Performed at [facility], [address], [place], [place] [address]    Report Status  PENDING  Incomplete     Labs: BNP (last 3 results) No results for input(s): BNP in the last 8760 hours. Basic Metabolic Panel: Recent Labs  Lab 09/07/24 1012 09/07/24 1043 09/08/24 0424  NA 135  --  137  K 3.2*  --  3.6  CL 99  --  104  CO2 22  --  22  GLUCOSE 100*  --  139*  BUN 12  --  14  CREATININE 0.90  --  0.90  CALCIUM  9.1  --  8.9  MG  --  1.9  --    Liver Function Tests: Recent Labs  Lab 09/07/24 1012  AST 54*  ALT 21  ALKPHOS 106  BILITOT 0.4  PROT 6.4*  ALBUMIN 3.7   No results for input(s): LIPASE, AMYLASE in the last 168 hours. No results for input(s): AMMONIA in the last 168 hours. CBC: Recent Labs  Lab 09/07/24 1012  WBC 6.0  NEUTROABS 3.8  HGB 13.1  HCT 39.0  MCV 91.5  PLT 182   Cardiac Enzymes: No results for input(s): CKTOTAL, CKMB, CKMBINDEX, TROPONINI in the last 168 hours. BNP: Invalid input(s): POCBNP CBG: No results for input(s): GLUCAP in the last 168 hours. D-Dimer No results for input(s): DDIMER in the last 72 hours. Hgb A1c No results for input(s): HGBA1C in the last 72 hours. Lipid Profile No results for input(s): CHOL, HDL, LDLCALC, TRIG, CHOLHDL, LDLDIRECT in the last 72 hours. Thyroid function studies No results for input(s): TSH, T4TOTAL, T3FREE, THYROIDAB in the last 72 hours.  Invalid input(s): FREET3 Anemia work up No results for input(s): VITAMINB12, FOLATE, FERRITIN, TIBC, IRON, RETICCTPCT in the last 72 hours. Urinalysis    Component Value Date/Time   BILIRUBINUR small (A) 07/06/2022 1937   KETONESUR negative 07/06/2022 1937   PROTEINUR =100 (A) 07/06/2022 1937   UROBILINOGEN 1.0 07/06/2022 1937   NITRITE Negative 07/06/2022 1937   LEUKOCYTESUR Small (1+) (A) 07/06/2022 1937   Sepsis Labs Recent Labs  Lab 09/07/24 1012  WBC 6.0   Microbiology Recent Results (from the past 240 hours)  Resp panel by RT-PCR (RSV, Flu A&B, Covid) Anterior Nasal  Swab     Status: Abnormal   Collection Time: 09/07/24  9:53 AM   Specimen: Anterior Nasal Swab  Result Value Ref Range Status   SARS Coronavirus 2 by RT PCR NEGATIVE NEGATIVE Final    Comment: (NOTE) SARS-CoV-2 target nucleic acids are NOT DETECTED.  The SARS-CoV-2 RNA is generally detectable in upper respiratory specimens during the  acute phase of infection. The lowest concentration of SARS-CoV-2 viral copies this assay can detect is 138 copies/mL. A negative result does not preclude SARS-Cov-2 infection and should not be used as the sole basis for treatment or other patient management decisions. A negative result may occur with  improper specimen collection/handling, submission of specimen other than nasopharyngeal swab, presence of viral mutation(s) within the areas targeted by this assay, and inadequate number of viral copies(<138 copies/mL). A negative result must be combined with clinical observations, patient history, and epidemiological information. The expected result is Negative.  Fact Sheet for Patients:  bloggercourse.com  Fact Sheet for Healthcare Providers:  seriousbroker.it  This test is no t yet approved or cleared by the [place]  FDA and  has been authorized for detection and/or diagnosis of SARS-CoV-2 by FDA under an Emergency Use Authorization (EUA). This EUA will remain  in effect (meaning this test can be used) for the duration of the COVID-19 declaration under Section 564(b)(1) of the Act, 21 U.S.C.section 360bbb-3(b)(1), unless the authorization is terminated  or revoked sooner.       Influenza A by PCR POSITIVE (A) NEGATIVE Final   Influenza B by PCR NEGATIVE NEGATIVE Final    Comment: (NOTE) The Xpert Xpress SARS-CoV-2/FLU/RSV plus assay is intended as an aid in the diagnosis of influenza from Nasopharyngeal swab specimens and should not be used as a sole basis for treatment. Nasal washings  and aspirates are unacceptable for Xpert Xpress SARS-CoV-2/FLU/RSV testing.  Fact Sheet for Patients: bloggercourse.com  Fact Sheet for Healthcare Providers: seriousbroker.it  This test is not yet approved or cleared by the [place]  FDA and has been authorized for detection and/or diagnosis of SARS-CoV-2 by FDA under an Emergency Use Authorization (EUA). This EUA will remain in effect (meaning this test can be used) for the duration of the COVID-19 declaration under Section 564(b)(1) of the Act, 21 U.S.C. section 360bbb-3(b)(1), unless the authorization is terminated or revoked.     Resp Syncytial Virus by PCR NEGATIVE NEGATIVE Final    Comment: (NOTE) Fact Sheet for Patients: bloggercourse.com  Fact Sheet for Healthcare Providers: seriousbroker.it  This test is not yet approved or cleared by the [place]  FDA and has been authorized for detection and/or diagnosis of SARS-CoV-2 by FDA under an Emergency Use Authorization (EUA). This EUA will remain in effect (meaning this test can be used) for the duration of the COVID-19 declaration under Section 564(b)(1) of the Act, 21 U.S.C. section 360bbb-3(b)(1), unless the authorization is terminated or revoked.  Performed at [facility], [address] [place], [place] [address]   Culture, blood (routine x 2)     Status: None (Preliminary result)   Collection Time: 09/07/24 11:17 AM   Specimen: BLOOD  Result Value Ref Range Status   Specimen Description BLOOD LEFT ANTECUBITAL  Final   Special Requests AEROBIC BOTTLE ONLY Blood Culture adequate volume  Final   Culture   Final    NO GROWTH 2 DAYS Performed at [facility], [address] [place], [place] [address]    Report Status PENDING  Incomplete  Culture, blood (routine x 2)     Status: None (Preliminary result)   Collection Time: 09/07/24 11:17 AM   Specimen: BLOOD   Result Value Ref Range Status   Specimen Description BLOOD BLOOD LEFT HAND  Final   Special Requests AEROBIC BOTTLE ONLY Blood Culture adequate volume  Final   Culture   Final    NO GROWTH 2 DAYS Performed at  [facility], [address], [place], [place] [address]    Report Status PENDING  Incomplete     Time coordinating discharge: 35 minutes  SIGNED:   Derryl Duval, MD  [facility] 09/09/2024, 12:24 PM      [1]  Allergies Allergen Reactions   Azo [Phenazopyridine] Hives   Pyridium [Phenazopyridine Hcl] Hives   Requip [Ropinirole Hcl] Swelling   Betadine [Povidone Iodine] Other (See Comments)    Burn rash   Diclofenac Anxiety and Other (See Comments)    Worsens restless legs symptoms.   Demerol [Meperidine] Hives   Edta [Edetic Acid] Swelling    Throat swelling and voice to change   Macrobid [Nitrofurantoin Macrocrystal] Other (See Comments)    bronchial spasms   Ceclor [Cefaclor] Rash   Cephalosporins Rash   Dynacirc [Isradipine] Rash   Keflex [Cephalexin] Rash   Sulfa Antibiotics Rash   Voltaren [Diclofenac Sodium] Anxiety

## 2024-09-09 NOTE — Plan of Care (Signed)

## 2024-09-10 ENCOUNTER — Ambulatory Visit: Admitting: Cardiology

## 2024-09-10 NOTE — Progress Notes (Unsigned)
 "     Clinical Summary [name] is a 74 y.o.female   Previously seen by [facility]   1.NSVT?  -prior evaluation at [facility] - recent zio patch by pcp  2.HTN  3.Orthostatic hypotension?  4. Influenza A Discharged just yesterday, also managed for asthma exacerbation.   Past Medical History:  Diagnosis Date   Anxiety    Arthritis    Cancer (HCC)    R breast cancer   Depression    Difficult intubation    Heart murmur    insignificant per MD per pt   High blood pressure    High cholesterol    History of kidney stones    Macular degeneration of left eye    Macular degeneration of right eye    Obstructive sleep apnea    has cpap   Restless leg syndrome    V tach (HCC)    hx of with sleep study laying on back per pt - 2016     Allergies[1]   Current Outpatient Medications  Medication Sig Dispense Refill   Albuterol  Sulfate 108 (90 Base) MCG/ACT AEPB Inhale 1-2 puffs into the lungs 4 (four) times daily as needed (Asthma/Wheezing/ Shortness of breath).     anastrozole  (ARIMIDEX ) 1 MG tablet Take 1 mg by mouth daily.     atorvastatin  (LIPITOR) 40 MG tablet Take 40 mg by mouth daily.     buPROPion  (WELLBUTRIN  SR) 150 MG 12 hr tablet Take 1 tablet (150 mg total) by mouth 2 (two) times daily. 60 tablet 2   calcium -vitamin D 250-100 MG-UNIT tablet Take 1 tablet by mouth daily.     ELIQUIS  5 MG TABS tablet Take by mouth.     escitalopram  (LEXAPRO ) 20 MG tablet Take 1 tablet (20 mg total) by mouth daily. 30 tablet 2   fluticasone  (FLONASE ) 50 MCG/ACT nasal spray Place 1 spray into both nostrils daily as needed for allergies or rhinitis.     hydrochlorothiazide  (HYDRODIURIL ) 25 MG tablet Take 25 mg by mouth daily.     ipratropium-albuterol  (DUONEB) 0.5-2.5 (3) MG/3ML SOLN Take 3 mLs by nebulization every 6 (six) hours as needed (asthma).     lamoTRIgine  (LAMICTAL ) 150 MG tablet Take 1 tablet (150 mg total) by mouth daily. 30 tablet 2   levothyroxine  (SYNTHROID )  25 MCG tablet Take 25 mcg by mouth daily before breakfast.     losartan  (COZAAR ) 100 MG tablet Take 100 mg by mouth daily.     modafinil  (PROVIGIL ) 200 MG tablet Take 200 mg by mouth every morning.     Multiple Vitamin (MULTIVITAMIN WITH MINERALS) TABS tablet Take 1 tablet by mouth daily.     pramipexole  (MIRAPEX ) 0.5 MG tablet Take 0.5 mg by mouth in the morning and at bedtime.     predniSONE  (DELTASONE ) 10 MG tablet 30 mg daily for 3 days followed by 20 mg daily for 3 days followed by 10 mg daily for 3 days then stop 18 tablet 0   TRELEGY ELLIPTA 200-62.5-25 MCG/ACT AEPB Take 1 puff by mouth daily.     No current facility-administered medications for this visit.     Past Surgical History:  Procedure Laterality Date   ABDOMINAL HYSTERECTOMY     APPENDECTOMY     arthroscopy left shoulder     CHOLECYSTECTOMY     joint fusion bilateral thumbs     MASTECTOMY Bilateral 2019   ORIF right ankle     REPLACEMENT TOTAL KNEE BILATERAL     REVERSE SHOULDER  ARTHROPLASTY Right 10/14/2021   Procedure: REVERSE SHOULDER ARTHROPLASTY;  Surgeon: Dozier Soulier, MD;  Location: [facility];  Service: Orthopedics;  Laterality: Right;     Allergies[2]    Family History  Problem Relation Age of Onset   Depression Mother    Hyperlipidemia Mother    Hypertension Mother    Depression Brother    Hyperlipidemia Father    Hypertension Father    Asthma Father      Social History [name] reports that she quit smoking about 2 years ago. Her smoking use included cigarettes. She started smoking about 28 years ago. She has a 25 pack-year smoking history. She has never used smokeless tobacco. [name] reports current alcohol use.   Review of Systems CONSTITUTIONAL: No weight loss, fever, chills, weakness or fatigue.  HEENT: Eyes: No visual loss, blurred vision, double vision or yellow sclerae.No hearing loss, sneezing, congestion, runny nose or sore throat.  SKIN: No rash or itching.   CARDIOVASCULAR:  RESPIRATORY: No shortness of breath, cough or sputum.  GASTROINTESTINAL: No anorexia, nausea, vomiting or diarrhea. No abdominal pain or blood.  GENITOURINARY: No burning on urination, no polyuria NEUROLOGICAL: No headache, dizziness, syncope, paralysis, ataxia, numbness or tingling in the extremities. No change in bowel or bladder control.  MUSCULOSKELETAL: No muscle, back pain, joint pain or stiffness.  LYMPHATICS: No enlarged nodes. No history of splenectomy.  PSYCHIATRIC: No history of depression or anxiety.  ENDOCRINOLOGIC: No reports of sweating, cold or heat intolerance. No polyuria or polydipsia.  Alice Bowers   Physical Examination There were no vitals filed for this visit. There were no vitals filed for this visit.  Gen: resting comfortably, no acute distress HEENT: no scleral icterus, pupils equal round and reactive, no palptable cervical adenopathy,  CV Resp: Clear to auscultation bilaterally GI: abdomen is soft, non-tender, non-distended, normal bowel sounds, no hepatosplenomegaly MSK: extremities are warm, no edema.  Skin: warm, no rash Neuro:  no focal deficits Psych: appropriate affect   Diagnostic Studies     Assessment and Plan        Alice Bowers, M.D., F.A.C.C.     [1]  Allergies Allergen Reactions   Azo [Phenazopyridine] Hives   Pyridium [Phenazopyridine Hcl] Hives   Requip [Ropinirole Hcl] Swelling   Betadine [Povidone Iodine] Other (See Comments)    Burn rash   Diclofenac Anxiety and Other (See Comments)    Worsens restless legs symptoms.   Demerol [Meperidine] Hives   Edta [Edetic Acid] Swelling    Throat swelling and voice to change   Macrobid [Nitrofurantoin Macrocrystal] Other (See Comments)    bronchial spasms   Ceclor [Cefaclor] Rash   Cephalosporins Rash   Dynacirc [Isradipine] Rash   Keflex [Cephalexin] Rash   Sulfa Antibiotics Rash   Voltaren [Diclofenac Sodium] Anxiety  [2]  Allergies Allergen Reactions    Azo [Phenazopyridine] Hives   Pyridium [Phenazopyridine Hcl] Hives   Requip [Ropinirole Hcl] Swelling   Betadine [Povidone Iodine] Other (See Comments)    Burn rash   Diclofenac Anxiety and Other (See Comments)    Worsens restless legs symptoms.   Demerol [Meperidine] Hives   Edta [Edetic Acid] Swelling    Throat swelling and voice to change   Macrobid [Nitrofurantoin Macrocrystal] Other (See Comments)    bronchial spasms   Ceclor [Cefaclor] Rash   Cephalosporins Rash   Dynacirc [Isradipine] Rash   Keflex [Cephalexin] Rash   Sulfa Antibiotics Rash   Voltaren [Diclofenac Sodium] Anxiety   "

## 2024-09-11 ENCOUNTER — Inpatient Hospital Stay (HOSPITAL_COMMUNITY)

## 2024-09-11 ENCOUNTER — Other Ambulatory Visit: Payer: Self-pay

## 2024-09-11 ENCOUNTER — Encounter (HOSPITAL_COMMUNITY): Payer: Self-pay

## 2024-09-11 ENCOUNTER — Inpatient Hospital Stay (HOSPITAL_COMMUNITY)
Admission: EM | Admit: 2024-09-11 | Discharge: 2024-09-16 | DRG: 193 | Disposition: A | Attending: Family Medicine | Admitting: Family Medicine

## 2024-09-11 ENCOUNTER — Emergency Department (HOSPITAL_COMMUNITY)

## 2024-09-11 DIAGNOSIS — Z9013 Acquired absence of bilateral breasts and nipples: Secondary | ICD-10-CM

## 2024-09-11 DIAGNOSIS — J45901 Unspecified asthma with (acute) exacerbation: Secondary | ICD-10-CM | POA: Diagnosis not present

## 2024-09-11 DIAGNOSIS — Y92009 Unspecified place in unspecified non-institutional (private) residence as the place of occurrence of the external cause: Secondary | ICD-10-CM

## 2024-09-11 DIAGNOSIS — I10 Essential (primary) hypertension: Secondary | ICD-10-CM | POA: Diagnosis present

## 2024-09-11 DIAGNOSIS — K219 Gastro-esophageal reflux disease without esophagitis: Secondary | ICD-10-CM | POA: Diagnosis present

## 2024-09-11 DIAGNOSIS — Z923 Personal history of irradiation: Secondary | ICD-10-CM

## 2024-09-11 DIAGNOSIS — Z9049 Acquired absence of other specified parts of digestive tract: Secondary | ICD-10-CM

## 2024-09-11 DIAGNOSIS — I4510 Unspecified right bundle-branch block: Secondary | ICD-10-CM | POA: Diagnosis present

## 2024-09-11 DIAGNOSIS — Z882 Allergy status to sulfonamides status: Secondary | ICD-10-CM

## 2024-09-11 DIAGNOSIS — F331 Major depressive disorder, recurrent, moderate: Secondary | ICD-10-CM

## 2024-09-11 DIAGNOSIS — Z96653 Presence of artificial knee joint, bilateral: Secondary | ICD-10-CM | POA: Diagnosis present

## 2024-09-11 DIAGNOSIS — F419 Anxiety disorder, unspecified: Secondary | ICD-10-CM | POA: Diagnosis present

## 2024-09-11 DIAGNOSIS — Z79811 Long term (current) use of aromatase inhibitors: Secondary | ICD-10-CM

## 2024-09-11 DIAGNOSIS — E782 Mixed hyperlipidemia: Secondary | ICD-10-CM | POA: Diagnosis present

## 2024-09-11 DIAGNOSIS — Z87442 Personal history of urinary calculi: Secondary | ICD-10-CM

## 2024-09-11 DIAGNOSIS — J9601 Acute respiratory failure with hypoxia: Secondary | ICD-10-CM | POA: Diagnosis not present

## 2024-09-11 DIAGNOSIS — E66812 Obesity, class 2: Secondary | ICD-10-CM | POA: Diagnosis present

## 2024-09-11 DIAGNOSIS — Z90710 Acquired absence of both cervix and uterus: Secondary | ICD-10-CM

## 2024-09-11 DIAGNOSIS — M1990 Unspecified osteoarthritis, unspecified site: Secondary | ICD-10-CM

## 2024-09-11 DIAGNOSIS — G4700 Insomnia, unspecified: Secondary | ICD-10-CM | POA: Diagnosis present

## 2024-09-11 DIAGNOSIS — Z86711 Personal history of pulmonary embolism: Secondary | ICD-10-CM

## 2024-09-11 DIAGNOSIS — F17210 Nicotine dependence, cigarettes, uncomplicated: Secondary | ICD-10-CM | POA: Diagnosis present

## 2024-09-11 DIAGNOSIS — G4733 Obstructive sleep apnea (adult) (pediatric): Secondary | ICD-10-CM | POA: Diagnosis present

## 2024-09-11 DIAGNOSIS — Z91120 Patient's intentional underdosing of medication regimen due to financial hardship: Secondary | ICD-10-CM

## 2024-09-11 DIAGNOSIS — Z7901 Long term (current) use of anticoagulants: Secondary | ICD-10-CM

## 2024-09-11 DIAGNOSIS — G2581 Restless legs syndrome: Secondary | ICD-10-CM | POA: Diagnosis present

## 2024-09-11 DIAGNOSIS — E876 Hypokalemia: Secondary | ICD-10-CM | POA: Diagnosis present

## 2024-09-11 DIAGNOSIS — Z96611 Presence of right artificial shoulder joint: Secondary | ICD-10-CM | POA: Diagnosis present

## 2024-09-11 DIAGNOSIS — R0603 Acute respiratory distress: Principal | ICD-10-CM

## 2024-09-11 DIAGNOSIS — J1008 Influenza due to other identified influenza virus with other specified pneumonia: Principal | ICD-10-CM | POA: Diagnosis present

## 2024-09-11 DIAGNOSIS — T45516A Underdosing of anticoagulants, initial encounter: Secondary | ICD-10-CM | POA: Diagnosis present

## 2024-09-11 DIAGNOSIS — Z6841 Body Mass Index (BMI) 40.0 and over, adult: Secondary | ICD-10-CM

## 2024-09-11 DIAGNOSIS — Z79899 Other long term (current) drug therapy: Secondary | ICD-10-CM

## 2024-09-11 DIAGNOSIS — F17200 Nicotine dependence, unspecified, uncomplicated: Secondary | ICD-10-CM | POA: Diagnosis present

## 2024-09-11 DIAGNOSIS — E039 Hypothyroidism, unspecified: Secondary | ICD-10-CM | POA: Diagnosis present

## 2024-09-11 DIAGNOSIS — J101 Influenza due to other identified influenza virus with other respiratory manifestations: Secondary | ICD-10-CM | POA: Diagnosis present

## 2024-09-11 DIAGNOSIS — E66813 Obesity, class 3: Secondary | ICD-10-CM | POA: Diagnosis present

## 2024-09-11 DIAGNOSIS — Z825 Family history of asthma and other chronic lower respiratory diseases: Secondary | ICD-10-CM

## 2024-09-11 DIAGNOSIS — C50411 Malignant neoplasm of upper-outer quadrant of right female breast: Secondary | ICD-10-CM | POA: Diagnosis present

## 2024-09-11 DIAGNOSIS — Z83438 Family history of other disorder of lipoprotein metabolism and other lipidemia: Secondary | ICD-10-CM

## 2024-09-11 DIAGNOSIS — Z881 Allergy status to other antibiotic agents status: Secondary | ICD-10-CM

## 2024-09-11 DIAGNOSIS — J441 Chronic obstructive pulmonary disease with (acute) exacerbation: Secondary | ICD-10-CM | POA: Diagnosis present

## 2024-09-11 DIAGNOSIS — F32A Depression, unspecified: Secondary | ICD-10-CM | POA: Diagnosis present

## 2024-09-11 DIAGNOSIS — Z818 Family history of other mental and behavioral disorders: Secondary | ICD-10-CM

## 2024-09-11 DIAGNOSIS — Z888 Allergy status to other drugs, medicaments and biological substances status: Secondary | ICD-10-CM

## 2024-09-11 DIAGNOSIS — Z8249 Family history of ischemic heart disease and other diseases of the circulatory system: Secondary | ICD-10-CM

## 2024-09-11 DIAGNOSIS — Z716 Tobacco abuse counseling: Secondary | ICD-10-CM

## 2024-09-11 LAB — CBC WITH DIFFERENTIAL/PLATELET
Abs Immature Granulocytes: 0.09 10*3/uL — ABNORMAL HIGH (ref 0.00–0.07)
Basophils Absolute: 0 10*3/uL (ref 0.0–0.1)
Basophils Relative: 0 %
Eosinophils Absolute: 0 10*3/uL (ref 0.0–0.5)
Eosinophils Relative: 0 %
HCT: 38.5 % (ref 36.0–46.0)
Hemoglobin: 12.8 g/dL (ref 12.0–15.0)
Immature Granulocytes: 1 %
Lymphocytes Relative: 12 %
Lymphs Abs: 1 10*3/uL (ref 0.7–4.0)
MCH: 30.1 pg (ref 26.0–34.0)
MCHC: 33.2 g/dL (ref 30.0–36.0)
MCV: 90.6 fL (ref 80.0–100.0)
Monocytes Absolute: 0.7 10*3/uL (ref 0.1–1.0)
Monocytes Relative: 9 %
Neutro Abs: 6.2 10*3/uL (ref 1.7–7.7)
Neutrophils Relative %: 78 %
Platelets: 203 10*3/uL (ref 150–400)
RBC: 4.25 MIL/uL (ref 3.87–5.11)
RDW: 13.5 % (ref 11.5–15.5)
WBC: 8.1 10*3/uL (ref 4.0–10.5)
nRBC: 0.5 % — ABNORMAL HIGH (ref 0.0–0.2)

## 2024-09-11 LAB — BASIC METABOLIC PANEL WITH GFR
Anion gap: 15 (ref 5–15)
BUN: 10 mg/dL (ref 8–23)
CO2: 23 mmol/L (ref 22–32)
Calcium: 8.6 mg/dL — ABNORMAL LOW (ref 8.9–10.3)
Chloride: 103 mmol/L (ref 98–111)
Creatinine, Ser: 0.6 mg/dL (ref 0.44–1.00)
GFR, Estimated: >60 mL/min
Glucose, Bld: 81 mg/dL (ref 70–99)
Potassium: 3.6 mmol/L (ref 3.5–5.1)
Sodium: 141 mmol/L (ref 135–145)

## 2024-09-11 LAB — PROCALCITONIN: Procalcitonin: <0.1 ng/mL

## 2024-09-11 LAB — PRO BRAIN NATRIURETIC PEPTIDE: Pro Brain Natriuretic Peptide: 1580 pg/mL — ABNORMAL HIGH

## 2024-09-11 MED ORDER — IPRATROPIUM-ALBUTEROL 0.5-2.5 (3) MG/3ML IN SOLN
3.0000 mL | RESPIRATORY_TRACT | Status: DC
  Administered 2024-09-11 – 2024-09-12 (×3): 3 mL via RESPIRATORY_TRACT
  Filled 2024-09-11 (×3): qty 3

## 2024-09-11 MED ORDER — NICOTINE 21 MG/24HR TD PT24
21.0000 mg | MEDICATED_PATCH | Freq: Every day | TRANSDERMAL | Status: DC
  Administered 2024-09-11 – 2024-09-16 (×6): 21 mg via TRANSDERMAL
  Filled 2024-09-11 (×6): qty 1

## 2024-09-11 MED ORDER — ANASTROZOLE 1 MG PO TABS
1.0000 mg | ORAL_TABLET | Freq: Every day | ORAL | Status: DC
  Administered 2024-09-12 – 2024-09-16 (×5): 1 mg via ORAL
  Filled 2024-09-11 (×6): qty 1

## 2024-09-11 MED ORDER — ALBUTEROL SULFATE (2.5 MG/3ML) 0.083% IN NEBU
10.0000 mg | INHALATION_SOLUTION | Freq: Once | RESPIRATORY_TRACT | Status: AC
  Administered 2024-09-11: 10 mg via RESPIRATORY_TRACT
  Filled 2024-09-11: qty 12

## 2024-09-11 MED ORDER — IOHEXOL 350 MG/ML SOLN
75.0000 mL | Freq: Once | INTRAVENOUS | Status: AC | PRN
  Administered 2024-09-11: 75 mL via INTRAVENOUS

## 2024-09-11 MED ORDER — ACETAMINOPHEN 650 MG RE SUPP: 650.0000 mg | Freq: Four times a day (QID) | RECTAL | Status: DC | PRN

## 2024-09-11 MED ORDER — VANCOMYCIN HCL 1250 MG/250ML IV SOLN
1250.0000 mg | INTRAVENOUS | Status: DC
  Administered 2024-09-12: 1250 mg via INTRAVENOUS
  Filled 2024-09-11: qty 250

## 2024-09-11 MED ORDER — METHYLPREDNISOLONE SODIUM SUCC 125 MG IJ SOLR
125.0000 mg | Freq: Two times a day (BID) | INTRAMUSCULAR | Status: DC
  Administered 2024-09-11 – 2024-09-12 (×2): 125 mg via INTRAVENOUS
  Filled 2024-09-11 (×2): qty 2

## 2024-09-11 MED ORDER — LEVOFLOXACIN IN D5W 750 MG/150ML IV SOLN
750.0000 mg | INTRAVENOUS | Status: DC
  Administered 2024-09-11 – 2024-09-15 (×5): 750 mg via INTRAVENOUS
  Filled 2024-09-11 (×5): qty 150

## 2024-09-11 MED ORDER — LEVOTHYROXINE SODIUM 25 MCG PO TABS
25.0000 ug | ORAL_TABLET | Freq: Every day | ORAL | Status: DC
  Administered 2024-09-12 – 2024-09-16 (×5): 25 ug via ORAL
  Filled 2024-09-11 (×5): qty 1

## 2024-09-11 MED ORDER — LOSARTAN POTASSIUM 50 MG PO TABS
100.0000 mg | ORAL_TABLET | Freq: Every day | ORAL | Status: DC
  Administered 2024-09-11 – 2024-09-15 (×5): 100 mg via ORAL
  Filled 2024-09-11 (×6): qty 2

## 2024-09-11 MED ORDER — POLYETHYLENE GLYCOL 3350 17 G PO PACK: 17.0000 g | PACK | Freq: Every day | ORAL | Status: DC | PRN

## 2024-09-11 MED ORDER — METHYLPREDNISOLONE SODIUM SUCC 125 MG IJ SOLR
125.0000 mg | Freq: Once | INTRAMUSCULAR | Status: AC
  Administered 2024-09-11: 125 mg via INTRAVENOUS
  Filled 2024-09-11: qty 2

## 2024-09-11 MED ORDER — PANTOPRAZOLE SODIUM 40 MG PO TBEC
40.0000 mg | DELAYED_RELEASE_TABLET | Freq: Every day | ORAL | Status: DC
  Administered 2024-09-11 – 2024-09-16 (×6): 40 mg via ORAL
  Filled 2024-09-11 (×6): qty 1

## 2024-09-11 MED ORDER — ESCITALOPRAM OXALATE 10 MG PO TABS
20.0000 mg | ORAL_TABLET | Freq: Every day | ORAL | Status: DC
  Administered 2024-09-11 – 2024-09-16 (×6): 20 mg via ORAL
  Filled 2024-09-11 (×6): qty 2

## 2024-09-11 MED ORDER — ACETAMINOPHEN 325 MG PO TABS
650.0000 mg | ORAL_TABLET | Freq: Four times a day (QID) | ORAL | Status: DC | PRN
  Administered 2024-09-12 – 2024-09-13 (×2): 650 mg via ORAL
  Filled 2024-09-11 (×2): qty 2

## 2024-09-11 MED ORDER — MAGNESIUM SULFATE 4 GM/100ML IV SOLN
4.0000 g | Freq: Once | INTRAVENOUS | Status: AC
  Administered 2024-09-11: 4 g via INTRAVENOUS
  Filled 2024-09-11: qty 100

## 2024-09-11 MED ORDER — VANCOMYCIN HCL IN DEXTROSE 1-5 GM/200ML-% IV SOLN
1000.0000 mg | Freq: Once | INTRAVENOUS | Status: AC
  Administered 2024-09-11: 1000 mg via INTRAVENOUS
  Filled 2024-09-11: qty 200

## 2024-09-11 MED ORDER — APIXABAN 5 MG PO TABS
5.0000 mg | ORAL_TABLET | Freq: Two times a day (BID) | ORAL | Status: DC
  Administered 2024-09-11 – 2024-09-16 (×10): 5 mg via ORAL
  Filled 2024-09-11: qty 1
  Filled 2024-09-11: qty 2
  Filled 2024-09-11 (×8): qty 1

## 2024-09-11 MED ORDER — ATORVASTATIN CALCIUM 40 MG PO TABS
40.0000 mg | ORAL_TABLET | Freq: Every day | ORAL | Status: DC
  Administered 2024-09-11 – 2024-09-16 (×6): 40 mg via ORAL
  Filled 2024-09-11 (×6): qty 1

## 2024-09-11 MED ORDER — HYDRALAZINE HCL 20 MG/ML IJ SOLN
10.0000 mg | INTRAMUSCULAR | Status: DC | PRN
  Administered 2024-09-11: 10 mg via INTRAVENOUS
  Filled 2024-09-11: qty 1

## 2024-09-11 MED ORDER — MODAFINIL 200 MG PO TABS
200.0000 mg | ORAL_TABLET | Freq: Every morning | ORAL | Status: DC
  Administered 2024-09-12 – 2024-09-16 (×5): 200 mg via ORAL
  Filled 2024-09-11 (×5): qty 1

## 2024-09-11 MED ORDER — ALBUTEROL SULFATE (2.5 MG/3ML) 0.083% IN NEBU: 2.5000 mg | INHALATION_SOLUTION | RESPIRATORY_TRACT | Status: DC | PRN

## 2024-09-11 MED ORDER — IPRATROPIUM BROMIDE 0.02 % IN SOLN
0.5000 mg | Freq: Once | RESPIRATORY_TRACT | Status: AC
  Administered 2024-09-11: 0.5 mg via RESPIRATORY_TRACT
  Filled 2024-09-11: qty 2.5

## 2024-09-11 MED ORDER — BUDESON-GLYCOPYRROL-FORMOTEROL 160-9-4.8 MCG/ACT IN AERO
2.0000 | INHALATION_SPRAY | Freq: Two times a day (BID) | RESPIRATORY_TRACT | Status: DC
  Administered 2024-09-11 – 2024-09-16 (×10): 2 via RESPIRATORY_TRACT
  Filled 2024-09-11: qty 5.9

## 2024-09-11 MED ORDER — HYDROCHLOROTHIAZIDE 25 MG PO TABS
25.0000 mg | ORAL_TABLET | Freq: Every day | ORAL | Status: DC
  Administered 2024-09-11 – 2024-09-15 (×5): 25 mg via ORAL
  Filled 2024-09-11 (×6): qty 1

## 2024-09-11 MED ORDER — APIXABAN 5 MG PO TABS: 5.0000 mg | ORAL_TABLET | Freq: Two times a day (BID) | ORAL | Status: DC

## 2024-09-11 MED ORDER — VANCOMYCIN HCL 1500 MG/300ML IV SOLN
1500.0000 mg | Freq: Once | INTRAVENOUS | Status: AC
  Administered 2024-09-11: 1500 mg via INTRAVENOUS
  Filled 2024-09-11: qty 300

## 2024-09-11 MED ORDER — PRAMIPEXOLE DIHYDROCHLORIDE 1 MG PO TABS
0.5000 mg | ORAL_TABLET | Freq: Three times a day (TID) | ORAL | Status: DC
  Administered 2024-09-11 – 2024-09-16 (×14): 0.5 mg via ORAL
  Filled 2024-09-11 (×14): qty 1

## 2024-09-11 NOTE — ED Notes (Addendum)
 Patient transported to CT

## 2024-09-11 NOTE — Consult Note (Signed)
 Pharmacy Antibiotic Note  Dalaney Needle is a 74 y.o. female admitted on 09/11/2024 with pneumonia.  Pharmacy has been consulted for vancomycin  dosing.  Plan: Give vancomycin  2500 mg IV x 1, then start 1250 mg IV every 24 hours Estimated AUC 460, Cmin 10.8 IBW, Scr rounded to 0.8, Vd 0.5 (BMI 41.6) Vancomycin  levels at steady state or as clinically indicated Follow renal function for adjustments  Weight: 110.3 kg (243 lb 2.7 oz)  Temp (24hrs), Avg:97.8 F (36.6 C), Min:97.6 F (36.4 C), Max:98 F (36.7 C)  Recent Labs  Lab 09/07/24 1012 09/07/24 1038 09/07/24 1145 09/08/24 0424 09/11/24 1304  WBC 6.0  --   --   --  8.1  CREATININE 0.90  --   --  0.90 0.60  LATICACIDVEN  --  1.5 2.0*  --   --     Estimated Creatinine Clearance: 76 mL/min (by C-G formula based on SCr of 0.6 mg/dL).    Allergies[1]  Antimicrobials this admission: vancomycin  1/28 >>   Microbiology results: N/A  Thank you for allowing pharmacy to be a part of this patients care.  Kayla [name] Blew, PharmD 09/11/2024 8:36 PM     [1]  Allergies Allergen Reactions   Azo [Phenazopyridine] Hives   Pyridium [Phenazopyridine Hcl] Hives   Requip [Ropinirole Hcl] Swelling   Betadine [Povidone Iodine] Other (See Comments)    Burn rash   Diclofenac Anxiety and Other (See Comments)    Worsens restless legs symptoms.   Demerol [Meperidine] Hives   Edta [Edetic Acid] Swelling    Throat swelling and voice to change   Macrobid [Nitrofurantoin Macrocrystal] Other (See Comments)    bronchial spasms   Ceclor [Cefaclor] Rash   Cephalosporins Rash   Dynacirc [Isradipine] Rash   Keflex [Cephalexin] Rash   Sulfa Antibiotics Rash   Voltaren [Diclofenac Sodium] Anxiety

## 2024-09-11 NOTE — H&P (Addendum)
 "                                                                                                          [facility] H&P   Patient Demographics:    Alice Bowers, is a 74 y.o. female  MRN: [id number]   DOB - 1951/07/06  Admit Date - 09/11/2024  Outpatient Primary MD for the patient is Renella, April Y, NP    Chief Complaint  Patient presents with   Influenza      HPI:    Alice Bowers  is a 74 y.o. female, with medical history significant of anxiety, right breast cancer, postsurgery and radiation, hypertension, hyperlipidemia, OSA on CPAP, restless leg syndrome, history of multiple bilateral PE, with recent hospitalization 1/24 >> 08/2014 due to influenza infection, discharged on prednisone  taper, patient presents to ED secondary to worsening dyspnea and persistent wheezing.   - ED she was noted to have increased work of breathing and significantly diminished air entry, received IV steroids, continuous nebs, and given her work of breathing [facility] consulted to admit.  Significant for multifocal opacity, infectious versus volume overload, she is afebrile.    Review of systems:    .  A full 10 point Review of Systems was done, except as stated above, all other Review of Systems were negative.   With Past History of the following :    Past Medical History:  Diagnosis Date   Anxiety    Arthritis    Cancer (HCC)    R breast cancer   Depression    Difficult intubation    Heart murmur    insignificant per MD per pt   High blood pressure    High cholesterol    History of kidney stones    Macular degeneration of left eye    Macular degeneration of right eye    Obstructive sleep apnea    has cpap   Restless leg syndrome    V tach (HCC)    hx of with sleep study laying on back per pt - 2016      Past Surgical History:  Procedure Laterality Date   ABDOMINAL HYSTERECTOMY     APPENDECTOMY     arthroscopy left shoulder     CHOLECYSTECTOMY     joint fusion  bilateral thumbs     MASTECTOMY Bilateral 2019   ORIF right ankle     REPLACEMENT TOTAL KNEE BILATERAL     REVERSE SHOULDER ARTHROPLASTY Right 10/14/2021   Procedure: REVERSE SHOULDER ARTHROPLASTY;  Surgeon: Dozier Soulier, MD;  Location: [facility];  Service: Orthopedics;  Laterality: Right;      Social History:     Social History   Tobacco Use   Smoking status: Former    Current packs/day: 0.00    Average packs/day: 1 pack/day for 25.0 years (25.0 ttl pk-yrs)    Types: Cigarettes    Start date: 09/13/1996    Quit date: 09/13/2021    Years since quitting: 2.9   Smokeless tobacco: Never  Substance Use Topics   Alcohol use:  Yes    Comment: rare        Family History :     Family History  Problem Relation Age of Onset   Depression Mother    Hyperlipidemia Mother    Hypertension Mother    Depression Brother    Hyperlipidemia Father    Hypertension Father    Asthma Father       Home Medications:   Prior to Admission medications  Medication Sig Start Date End Date Taking? Authorizing Provider  Albuterol  Sulfate 108 (90 Base) MCG/ACT AEPB Inhale 1-2 puffs into the lungs 4 (four) times daily as needed (Asthma/Wheezing/ Shortness of breath).    [provider]  anastrozole  (ARIMIDEX ) 1 MG tablet Take 1 mg by mouth daily.    [provider]  atorvastatin  (LIPITOR) 40 MG tablet Take 40 mg by mouth daily.    [name], MD  buPROPion  (WELLBUTRIN  SR) 150 MG 12 hr tablet Take 1 tablet (150 mg total) by mouth 2 (two) times daily. 07/08/24   [name], [name], MD  calcium -vitamin D 250-100 MG-UNIT tablet Take 1 tablet by mouth daily. 03/11/19   [provider]  ELIQUIS  5 MG TABS tablet Take by mouth. 05/16/22   Javaid, Adnan, MD  escitalopram  (LEXAPRO ) 20 MG tablet Take 1 tablet (20 mg total) by mouth daily. 07/08/24   [name], [name], MD  fluticasone  (FLONASE ) 50 MCG/ACT nasal spray Place 1 spray into both nostrils daily as needed for allergies or  rhinitis. 12/13/19   [provider]  hydrochlorothiazide  (HYDRODIURIL ) 25 MG tablet Take 25 mg by mouth daily.    [provider]  ipratropium-albuterol  (DUONEB) 0.5-2.5 (3) MG/3ML SOLN Take 3 mLs by nebulization every 6 (six) hours as needed (asthma).    [provider]  lamoTRIgine  (LAMICTAL ) 150 MG tablet Take 1 tablet (150 mg total) by mouth daily. 07/08/24   [name], [name], MD  levothyroxine  (SYNTHROID ) 25 MCG tablet Take 25 mcg by mouth daily before breakfast.    [provider]  losartan  (COZAAR ) 100 MG tablet Take 100 mg by mouth daily.    [provider]  modafinil  (PROVIGIL ) 200 MG tablet Take 200 mg by mouth every morning. 09/09/21   [provider]  Multiple Vitamin (MULTIVITAMIN WITH MINERALS) TABS tablet Take 1 tablet by mouth daily.    [provider]  pramipexole  (MIRAPEX ) 0.5 MG tablet Take 0.5 mg by mouth in the morning and at bedtime. 09/29/21   [provider]  predniSONE  (DELTASONE ) 10 MG tablet 30 mg daily for 3 days followed by 20 mg daily for 3 days followed by 10 mg daily for 3 days then stop 09/09/24   Sigdel, Santosh, MD  TRELEGY ELLIPTA 200-62.5-25 MCG/ACT AEPB Take 1 puff by mouth daily. 09/09/21   [provider]  allopurinol (ZYLOPRIM) 300 MG tablet Take 300 mg by mouth daily.  10/06/19  [name], MD     Allergies:    Allergies[1]   Physical Exam:   Vitals  Blood pressure (!) 160/74, pulse 76, temperature 97.8 F (36.6 C), resp. rate (!) 25, weight 110.3 kg, SpO2 99%.   1. General Well, in mild distress due to dyspnea and increased work of breathing 2. Normal affect and insight, Not Suicidal or Homicidal, Awake Alert, Oriented X 3.  3. No F.N deficits, ALL C.Nerves Intact, Strength 5/5 all 4 extremities, Sensation intact all 4 extremities, Plantars down going.  4. Ears and Eyes appear Normal, Conjunctivae clear, PERRLA. Moist Oral Mucosa.  5. Supple Neck, No JVD,  No  Carotid Bruits.  6.  Difficultly diminished air entry bilaterally, with diffuse wheezing and tachypnea  7. RRR, No Gallops, Rubs or Murmurs, No Parasternal Heave.  8. Positive Bowel Sounds, Abdomen Soft, No tenderness, No organomegaly appriciated,No rebound -guarding or rigidity.  9.  No Cyanosis, Normal Skin Turgor, No Skin Rash or Bruise.  10. Good muscle tone,  joints appear normal , no effusions, Normal ROM.     Data Review:    CBC Recent Labs  Lab 09/07/24 1012 09/11/24 1304  WBC 6.0 8.1  HGB 13.1 12.8  HCT 39.0 38.5  PLT 182 203  MCV 91.5 90.6  MCH 30.8 30.1  MCHC 33.6 33.2  RDW 13.9 13.5  LYMPHSABS 1.4 1.0  MONOABS 0.8 0.7  EOSABS 0.0 0.0  BASOSABS 0.0 0.0   ------------------------------------------------------------------------------------------------------------------  Chemistries  Recent Labs  Lab 09/07/24 1012 09/07/24 1043 09/08/24 0424 09/11/24 1304  NA 135  --  137 141  K 3.2*  --  3.6 3.6  CL 99  --  104 103  CO2 22  --  22 23  GLUCOSE 100*  --  139* 81  BUN 12  --  14 10  CREATININE 0.90  --  0.90 0.60  CALCIUM  9.1  --  8.9 8.6*  MG  --  1.9  --   --   AST 54*  --   --   --   ALT 21  --   --   --   ALKPHOS 106  --   --   --   BILITOT 0.4  --   --   --    ------------------------------------------------------------------------------------------------------------------ estimated creatinine clearance is 76 mL/min (by C-G formula based on SCr of 0.6 mg/dL). ------------------------------------------------------------------------------------------------------------------ No results for input(s): TSH, T4TOTAL, T3FREE, THYROIDAB in the last 72 hours.  Invalid input(s): FREET3  Coagulation profile No results for input(s): INR, PROTIME in the last 168 hours. ------------------------------------------------------------------------------------------------------------------- No results for input(s): DDIMER in the last 72  hours. -------------------------------------------------------------------------------------------------------------------  Cardiac Enzymes No results for input(s): CKMB, TROPONINI, MYOGLOBIN in the last 168 hours.  Invalid input(s): CK ------------------------------------------------------------------------------------------------------------------    Component Value Date/Time   BNP 68.0 01/25/2020 1240     ---------------------------------------------------------------------------------------------------------------  Urinalysis    Component Value Date/Time   BILIRUBINUR small (A) 07/06/2022 1937   KETONESUR negative 07/06/2022 1937   PROTEINUR =100 (A) 07/06/2022 1937   UROBILINOGEN 1.0 07/06/2022 1937   NITRITE Negative 07/06/2022 1937   LEUKOCYTESUR Small (1+) (A) 07/06/2022 1937    ----------------------------------------------------------------------------------------------------------------   Imaging Results:    DG Chest 2 View Result Date: 09/11/2024 EXAM: 2 VIEW(S) XRAY OF THE CHEST 09/11/2024 02:22:00 PM COMPARISON: 09/07/2024 CLINICAL HISTORY: Shortness of breath, positive influenza on 1-24. FINDINGS: LINES, TUBES AND DEVICES: Left subclavian Port-A-Cath with tip at SVC. LUNGS AND PLEURA: Bilateral interstitial and patchy airspace opacities. Trace bilateral pleural effusions. No pneumothorax. HEART AND MEDIASTINUM: No acute abnormality of the cardiac and mediastinal silhouettes. 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 Right reverse total shoulder arthroplasty noted. IMPRESSION: 1. Bilateral interstitial and patchy airspace opacities, which may reflect edema or infection. 2. Trace bilateral pleural effusions. Electronically signed by: Norleen Boxer MD 09/11/2024 02:45 PM EST RP Workstation: [id number]     Assessment & Plan:    Principal Problem:   Asthma exacerbation Active Problems:   OSA on CPAP   Mixed  hyperlipidemia   Obesity, Class III, BMI 40-49.9 (morbid obesity) (HCC)  Restless legs syndrome (RLS)   Tobacco dependence   Malignant neoplasm of upper-outer quadrant of right female breast (HCC)   Influenza A   Asthma exacerbation Influenza A infection Possible COPD exacerbation -With significant wheezing, significantly tachypneic with increased work of breathing. - Obtain CTA chest to differentiate volume overload versus infection given abnormal x-ray, as well with history of PE noncompliance with Eliquis  will need to rule out acute PE. - Continue with scheduled DuoNebs, as needed albuterol . - Continue with IV Solu-Medrol , given her wheezing and work of breathing will keep on 125 mg IV twice daily.  Will give 4 g of mag sulfate. - Will await imaging to determine need of diuresis versus antibiotics, but will add procalcitonin and proBNP Franchi at x-ray findings related to volume versus pneumonia. - Continue with Trelegy Ellipta.  Addendum: CTA chest negative for PE, but significant for multifocal pneumonia, will start on vancomycin  given recent flu infection and high risk for MRSA pneumonia as well to cover for HCAP, given her allergies to cephalosporin will start on Levaquin  as well  History of bilateral PE - Appears noncompliance with Eliquis  due to cost - Resume Eliquis , will obtain CTA chest to rule out PE protocol. - Counseled about importance of compliance, will consult social worker regarding Eliquis  and she cannot afford it  Tobacco dependence - She was counseled at length, will start nicotine  patch  - I have discussed with her at length that with her history of asthma, and radiation injury to her lung from breast cancer, as well now she is smoking with possible COPD with who will have significant effect on her respiratory status  History of breast cancer - S/p radiation and surgery, continue with anastrozole   Obesity class III - Body mass index is 41.74 kg/m. - This  will complicate her recovery  Restless leg syndrome - Continue with home Mirapex    obstructive sleep apnea  -continue with home CPAP   mixed hyperlipidemia  -continue with home statin  DVT Prophylaxis request  AM Labs Ordered, also please review Full Orders  Family Communication: Admission, patients condition and plan of care including tests being ordered have been discussed with the patient and her friend at bedside who indicate understanding and agree with the plan and Code Status.  Code Status full code  Likely DC to home  Consults called: None  Admission status: Inpatient  Time spent in minutes : 75 minutes   Brayton Lye M.D on 09/11/2024 at 4:31 PM   [facility] - Office  [phone]        [1]  Allergies Allergen Reactions   Azo [Phenazopyridine] Hives   Pyridium [Phenazopyridine Hcl] Hives   Requip [Ropinirole Hcl] Swelling   Betadine [Povidone Iodine] Other (See Comments)    Burn rash   Diclofenac Anxiety and Other (See Comments)    Worsens restless legs symptoms.   Demerol [Meperidine] Hives   Edta [Edetic Acid] Swelling    Throat swelling and voice to change   Macrobid [Nitrofurantoin Macrocrystal] Other (See Comments)    bronchial spasms   Ceclor [Cefaclor] Rash   Cephalosporins Rash   Dynacirc [Isradipine] Rash   Keflex [Cephalexin] Rash   Sulfa Antibiotics Rash   Voltaren [Diclofenac Sodium] Anxiety   "

## 2024-09-11 NOTE — ED Triage Notes (Signed)
 Pt reports she was discharged Monday from this facility after admission for flu with shob.  Pt reports she was unable to get her prednisone  from the pharmacy after discharge and is wheezing and having shob.

## 2024-09-11 NOTE — ED Provider Notes (Signed)
 " [place] [facility] AT [facility] Provider Note   CSN: [id number] Arrival date & time: 09/11/24  1217     Patient presents with: Influenza   Alice Bowers is a 74 y.o. female.  {Add pertinent medical, surgical, social history, OB history to HPI:32947} HPI     74 y.o. female with medical history significant of anxiety, right breast cancer, hypertension, hyperlipidemia, OSA on CPAP, restless leg syndrome who presented to the ER with increasing cough and shortness of breath.  Patient was recently admitted between 1/24-1/28 by hospitalist for flu and hypoxia.  Patient was discharged, after being weaned off of oxygen .  She was outside of Tamiflu window, and was given prednisone  taper.  Patient states that despite using inhalers every 4-6 hours, she continues to have severe shortness of breath and finally decided to come to the ER.  Prior to Admission medications  Medication Sig Start Date End Date Taking? Authorizing Provider  Albuterol  Sulfate 108 (90 Base) MCG/ACT AEPB Inhale 1-2 puffs into the lungs 4 (four) times daily as needed (Asthma/Wheezing/ Shortness of breath).    [provider]  anastrozole  (ARIMIDEX ) 1 MG tablet Take 1 mg by mouth daily.    [provider]  atorvastatin  (LIPITOR) 40 MG tablet Take 40 mg by mouth daily.    [name], MD  buPROPion  (WELLBUTRIN  SR) 150 MG 12 hr tablet Take 1 tablet (150 mg total) by mouth 2 (two) times daily. 07/08/24   [name], [name], MD  calcium -vitamin D 250-100 MG-UNIT tablet Take 1 tablet by mouth daily. 03/11/19   [provider]  ELIQUIS  5 MG TABS tablet Take by mouth. 05/16/22   Javaid, Adnan, MD  escitalopram  (LEXAPRO ) 20 MG tablet Take 1 tablet (20 mg total) by mouth daily. 07/08/24   [name], [name], MD  fluticasone  (FLONASE ) 50 MCG/ACT nasal spray Place 1 spray into both nostrils daily as needed for allergies or rhinitis. 12/13/19   [provider]   hydrochlorothiazide  (HYDRODIURIL ) 25 MG tablet Take 25 mg by mouth daily.    [provider]  ipratropium-albuterol  (DUONEB) 0.5-2.5 (3) MG/3ML SOLN Take 3 mLs by nebulization every 6 (six) hours as needed (asthma).    [provider]  lamoTRIgine  (LAMICTAL ) 150 MG tablet Take 1 tablet (150 mg total) by mouth daily. 07/08/24   [name], [name], MD  levothyroxine  (SYNTHROID ) 25 MCG tablet Take 25 mcg by mouth daily before breakfast.    [provider]  losartan  (COZAAR ) 100 MG tablet Take 100 mg by mouth daily.    [provider]  modafinil  (PROVIGIL ) 200 MG tablet Take 200 mg by mouth every morning. 09/09/21   [provider]  Multiple Vitamin (MULTIVITAMIN WITH MINERALS) TABS tablet Take 1 tablet by mouth daily.    [provider]  pramipexole  (MIRAPEX ) 0.5 MG tablet Take 0.5 mg by mouth in the morning and at bedtime. 09/29/21   [provider]  predniSONE  (DELTASONE ) 10 MG tablet 30 mg daily for 3 days followed by 20 mg daily for 3 days followed by 10 mg daily for 3 days then stop 09/09/24   Sigdel, Santosh, MD  TRELEGY ELLIPTA 200-62.5-25 MCG/ACT AEPB Take 1 puff by mouth daily. 09/09/21   [provider]  allopurinol (ZYLOPRIM) 300 MG tablet Take 300 mg by mouth daily.  10/06/19  [name], MD    Allergies: Azo [phenazopyridine], Pyridium [phenazopyridine hcl], Requip [ropinirole hcl], Betadine [povidone iodine], Diclofenac, Demerol [meperidine], Edta [edetic acid], Macrobid [nitrofurantoin macrocrystal],  Ceclor [cefaclor], Cephalosporins, Dynacirc [isradipine], Keflex [cephalexin], Sulfa antibiotics, and Voltaren [diclofenac sodium]    Review of Systems  All other systems reviewed and are negative.   Updated Vital Signs BP (!) 171/84   Pulse 68   Temp 97.8 F (36.6 C)   Resp (!) 24   Wt 110.3 kg   SpO2 97%   BMI 41.74 kg/m   Physical Exam Vitals and nursing note reviewed.  Constitutional:      Appearance:  She is well-developed.  HENT:     Head: Atraumatic.  Cardiovascular:     Rate and Rhythm: Normal rate.  Pulmonary:     Effort: Pulmonary effort is normal.     Breath sounds: Wheezing present.  Musculoskeletal:     Cervical back: Normal range of motion and neck supple.  Skin:    General: Skin is warm and dry.  Neurological:     Mental Status: She is alert and oriented to person, place, and time.     (all labs ordered are listed, but only abnormal results are displayed) Labs Reviewed  BASIC METABOLIC PANEL WITH GFR - Abnormal; Notable for the following components:      Result Value   Calcium  8.6 (*)    All other components within normal limits  CBC WITH DIFFERENTIAL/PLATELET - Abnormal; Notable for the following components:   nRBC 0.5 (*)    Abs Immature Granulocytes 0.09 (*)    All other components within normal limits    EKG: None  Radiology: DG Chest 2 View Result Date: 09/11/2024 EXAM: 2 VIEW(S) XRAY OF THE CHEST 09/11/2024 02:22:00 PM COMPARISON: 09/07/2024 CLINICAL HISTORY: Shortness of breath, positive influenza on 1-24. FINDINGS: LINES, TUBES AND DEVICES: Left subclavian Port-A-Cath with tip at SVC. LUNGS AND PLEURA: Bilateral interstitial and patchy airspace opacities. Trace bilateral pleural effusions. No pneumothorax. HEART AND MEDIASTINUM: No acute abnormality of the cardiac and mediastinal silhouettes. 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 Right reverse total shoulder arthroplasty noted. IMPRESSION: 1. Bilateral interstitial and patchy airspace opacities, which may reflect edema or infection. 2. Trace bilateral pleural effusions. Electronically signed by: Norleen Boxer MD 09/11/2024 02:45 PM EST RP Workstation: [id number]    {Document cardiac monitor, telemetry assessment procedure when appropriate:32947} Procedures   Medications Ordered in the ED  albuterol  (PROVENTIL ) (2.5 MG/3ML) 0.083% nebulizer solution 10 mg (10 mg  Nebulization Given 09/11/24 1418)  ipratropium (ATROVENT ) nebulizer solution 0.5 mg (0.5 mg Nebulization Given 09/11/24 1418)      {Click here for ABCD2, HEART and other calculators REFRESH Note before signing:1}                              Medical Decision Making Amount and/or Complexity of Data Reviewed Labs: ordered. Radiology: ordered.  Risk Prescription drug management.   74 y.o. female with medical history significant of anxiety, right breast cancer, hypertension, hyperlipidemia, OSA on CPAP, restless leg syndrome presents to the ER with chief complaint of shortness of breath.  She is having diffuse wheezing, but O2 sats are 97%.  Patient is tachypneic, but not in respiratory distress.  She was admitted to the hospital recently for hypoxia and influenza from 1-24 to 1-26.  I have reviewed patient's discharge summary.  Plan is to get basic labs and x-ray of the chest.  Will give her additional breathing treatment.  Final diagnoses:  None    ED Discharge Orders     None        "

## 2024-09-12 ENCOUNTER — Other Ambulatory Visit (HOSPITAL_COMMUNITY): Payer: Self-pay

## 2024-09-12 ENCOUNTER — Inpatient Hospital Stay (HOSPITAL_COMMUNITY)

## 2024-09-12 DIAGNOSIS — R0609 Other forms of dyspnea: Secondary | ICD-10-CM

## 2024-09-12 LAB — CULTURE, BLOOD (ROUTINE X 2)
Culture: NO GROWTH
Culture: NO GROWTH
Special Requests: ADEQUATE
Special Requests: ADEQUATE

## 2024-09-12 LAB — ECHOCARDIOGRAM COMPLETE
AR max vel: 2.81 cm2
AV Area VTI: 2.39 cm2
AV Area mean vel: 2.59 cm2
AV Mean grad: 7 mmHg
AV Peak grad: 14.1 mmHg
Ao pk vel: 1.88 m/s
Area-P 1/2: 3.37 cm2
Calc EF: 65.5 %
S' Lateral: 2.4 cm
Single Plane A2C EF: 72.6 %
Single Plane A4C EF: 60.2 %
Weight: 3890.68 [oz_av]

## 2024-09-12 LAB — CBC
HCT: 37.1 % (ref 36.0–46.0)
Hemoglobin: 12.7 g/dL (ref 12.0–15.0)
MCH: 30.4 pg (ref 26.0–34.0)
MCHC: 34.2 g/dL (ref 30.0–36.0)
MCV: 88.8 fL (ref 80.0–100.0)
Platelets: 212 10*3/uL (ref 150–400)
RBC: 4.18 MIL/uL (ref 3.87–5.11)
RDW: 13.3 % (ref 11.5–15.5)
WBC: 8.3 10*3/uL (ref 4.0–10.5)
nRBC: 0 % (ref 0.0–0.2)

## 2024-09-12 LAB — MRSA NEXT GEN BY PCR, NASAL: MRSA by PCR Next Gen: NOT DETECTED

## 2024-09-12 LAB — BASIC METABOLIC PANEL WITH GFR
Anion gap: 19 — ABNORMAL HIGH (ref 5–15)
BUN: 9 mg/dL (ref 8–23)
CO2: 20 mmol/L — ABNORMAL LOW (ref 22–32)
Calcium: 8.9 mg/dL (ref 8.9–10.3)
Chloride: 98 mmol/L (ref 98–111)
Creatinine, Ser: 0.7 mg/dL (ref 0.44–1.00)
GFR, Estimated: >60 mL/min
Glucose, Bld: 222 mg/dL — ABNORMAL HIGH (ref 70–99)
Potassium: 3.2 mmol/L — ABNORMAL LOW (ref 3.5–5.1)
Sodium: 137 mmol/L (ref 135–145)

## 2024-09-12 MED ORDER — CHLORHEXIDINE GLUCONATE CLOTH 2 % EX PADS
6.0000 | MEDICATED_PAD | Freq: Every day | CUTANEOUS | Status: DC
  Administered 2024-09-12 – 2024-09-16 (×5): 6 via TOPICAL

## 2024-09-12 MED ORDER — METHYLPREDNISOLONE SODIUM SUCC 125 MG IJ SOLR
62.5000 mg | Freq: Two times a day (BID) | INTRAMUSCULAR | Status: DC
  Administered 2024-09-12 – 2024-09-14 (×4): 62.5 mg via INTRAVENOUS
  Filled 2024-09-12 (×4): qty 2

## 2024-09-12 MED ORDER — DM-GUAIFENESIN ER 30-600 MG PO TB12
1.0000 | ORAL_TABLET | Freq: Two times a day (BID) | ORAL | Status: DC
  Administered 2024-09-12 – 2024-09-16 (×8): 1 via ORAL
  Filled 2024-09-12 (×8): qty 1

## 2024-09-12 MED ORDER — TRAZODONE HCL 50 MG PO TABS
50.0000 mg | ORAL_TABLET | Freq: Once | ORAL | Status: AC
  Administered 2024-09-12: 50 mg via ORAL
  Filled 2024-09-12: qty 1

## 2024-09-12 MED ORDER — IPRATROPIUM-ALBUTEROL 0.5-2.5 (3) MG/3ML IN SOLN
3.0000 mL | Freq: Four times a day (QID) | RESPIRATORY_TRACT | Status: DC
  Administered 2024-09-12 – 2024-09-16 (×16): 3 mL via RESPIRATORY_TRACT
  Filled 2024-09-12 (×15): qty 3

## 2024-09-12 MED ORDER — POTASSIUM CHLORIDE CRYS ER 10 MEQ PO TBCR
10.0000 meq | EXTENDED_RELEASE_TABLET | Freq: Once | ORAL | Status: AC
  Administered 2024-09-12: 10 meq via ORAL
  Filled 2024-09-12: qty 1

## 2024-09-12 MED ORDER — NYSTATIN 100000 UNIT/GM EX POWD
CUTANEOUS | Status: DC | PRN
  Administered 2024-09-12: 1 via TOPICAL
  Filled 2024-09-12: qty 15

## 2024-09-12 NOTE — Progress Notes (Signed)
" °  Transition of Care [facility]) Screening Note   Patient Details  Name: Alice Bowers Date of Birth: 1951-06-08   Transition of Care [facility]) CM/SW Contact:    Hoy DELENA Bigness, LCSW Phone Number: 09/12/2024, 3:24 PM    Transition of Care Department [facility]) has reviewed patient and no TOC needs have been identified at this time. We will continue to monitor patient advancement through interdisciplinary progression rounds. If new patient transition needs arise, please place a TOC consult.    09/12/24 1523  TOC Brief Assessment  Insurance and Status Reviewed  Patient has primary care physician Yes  Home environment has been reviewed Home alone  Prior level of function: Independent  Prior/Current Home Services No current home services  Social Drivers of Health Review SDOH reviewed no interventions necessary  Readmission risk has been reviewed Yes  Transition of care needs transition of care needs identified, TOC will continue to follow    "

## 2024-09-12 NOTE — Plan of Care (Signed)
   Problem: Education: Goal: Knowledge of General Education information will improve Description: Including pain rating scale, medication(s)/side effects and non-pharmacologic comfort measures Outcome: Progressing   Problem: Activity: Goal: Risk for activity intolerance will decrease Outcome: Progressing   Problem: Nutrition: Goal: Adequate nutrition will be maintained Outcome: Progressing

## 2024-09-12 NOTE — Plan of Care (Signed)
  Problem: Education: Goal: Knowledge of General Education information will improve Description: Including pain rating scale, medication(s)/side effects and non-pharmacologic comfort measures Outcome: Progressing   Problem: Health Behavior/Discharge Planning: Goal: Ability to manage health-related needs will improve Outcome: Progressing   Problem: Clinical Measurements: Goal: Ability to maintain clinical measurements within normal limits will improve Outcome: Progressing Goal: Respiratory complications will improve Outcome: Not Met (add Reason)

## 2024-09-12 NOTE — Evaluation (Signed)
 Physical Therapy Evaluation Patient Details Name: Alice Bowers MRN: [id number] DOB: 1951-03-17 Today's Date: 09/12/2024  History of Present Illness  Angeni Chaudhuri  is a 74 y.o. female, with medical history significant of anxiety, right breast cancer, postsurgery and radiation, hypertension, hyperlipidemia, OSA on CPAP, restless leg syndrome, history of multiple bilateral PE, with recent hospitalization 1/24 >> 08/2014 due to influenza infection, discharged on prednisone  taper, patient presents to ED secondary to worsening dyspnea and persistent wheezing.    - ED she was noted to have increased work of breathing and significantly diminished air entry, received IV steroids, continuous nebs, and given her work of breathing [facility] hospitalist consulted to admit.  Significant for multifocal opacity, infectious versus volume overload, she is afebrile.   Clinical Impression  Patient was agreeable to PT evaluation. Patient reports at baseline, she is a household and limited community ambulator with intermittent use of SPC and independent with ADLs. This date, patient remains at least modified independent with all bed mobility, transfers, and ambulation without AD. Patient is most limited by fatigue, exhibiting increased SOB/dyspnea while completing session on RA. Patient was received on 2 Lpm via [place] with SpO2 reading 93%. On RA  during mobility, SpO2 drops to 86% at lowest with pt demonstrating heavy SOB/wheezing. HR also reaches 105 bpm. SpO2 quickly recovers to 95% and above when back on 2 Lpm  and cueing for pursed lip breathing. Pt returns to bed at EOS, alarm set, call button in reach and nursing staff present. Patient does not present with urgent need for skilled physical therapy acutely at this time but may benefit from continued skilled physical therapy in recommended setting in order to address current deficits. Patient discharged to care of nursing for ambulation daily as tolerated for length of  stay.         If plan is discharge home, recommend the following: A little help with walking and/or transfers;Assist for transportation;Help with stairs or ramp for entrance   Can travel by private vehicle        Equipment Recommendations None recommended by PT  Recommendations for Other Services       Functional Status Assessment Patient has had a recent decline in their functional status and demonstrates the ability to make significant improvements in function in a reasonable and predictable amount of time.     Precautions / Restrictions Precautions Precautions: Fall Recall of Precautions/Restrictions: Intact Restrictions Weight Bearing Restrictions Per Provider Order: No      Mobility  Bed Mobility Overal bed mobility: Modified Independent             General bed mobility comments: HOB flat, no physical assist but inc time required secondary to slow labored movement    Transfers Overall transfer level: Modified independent Equipment used: None               General transfer comment: STS from bed and toilet in session, no AD use, pt demo slow labored movement throughout with SOB/dyspnea    Ambulation/Gait Ambulation/Gait assistance: Supervision Gait Distance (Feet): 45 Feet Assistive device: None Gait Pattern/deviations: Step-through pattern, Decreased step length - right, Decreased step length - left, Decreased stride length Gait velocity: Dec     General Gait Details: pt ambulates within room/restroom without AD but supervision, pt demo slight unsteadiness on feet as well as slow labored movement, pt with audible wheezing during gait, on RA throughout  [place]  Tilt Bed    Modified Rankin (Stroke Patients Only)       Balance Overall balance assessment: Mild deficits observed, not formally tested             Pertinent Vitals/Pain Pain Assessment Pain Assessment: No/denies pain    Home  Living Family/patient expects to be discharged to:: Private residence Living Arrangements: Alone Available Help at Discharge: Neighbor;Available PRN/intermittently Type of Home: House Home Access: Stairs to enter   Entrance Stairs-Number of Steps: 3   Home Layout: Two level;Able to live on main level with bedroom/bathroom Home Equipment: Shower seat;Cane - single point      Prior Function Prior Level of Function : Independent/Modified Independent;Working/employed;Driving             Mobility Comments: Reports intermittent SPC use, mostly household recently, reports at least 4 falls in last 6 months, still driving and working ADLs Comments: Reports independent with ADL although its been more difficult recently, assisted with iADLs at times     Extremity/Trunk Assessment   Upper Extremity Assessment Upper Extremity Assessment: Generalized weakness (Reports R shoulder operation 2 years ago with remaining AROM deficits, MMT 4/5 bilaterally)    Lower Extremity Assessment Lower Extremity Assessment: Generalized weakness (ankle DF MMT 4+/5, hip flexion 4-/5, all bilaterally)    Cervical / Trunk Assessment Cervical / Trunk Assessment: Kyphotic  Communication   Communication Communication: No apparent difficulties    Cognition Arousal: Alert Behavior During Therapy: WFL for tasks assessed/performed       Following commands: Intact       Cueing Cueing Techniques: Verbal cues, Visual cues     General Comments      Exercises     Assessment/Plan    PT Assessment All further PT needs can be met in the next venue of care  PT Problem List Decreased strength;Decreased activity tolerance;Decreased balance;Cardiopulmonary status limiting activity       PT Treatment Interventions      PT Goals (Current goals can be found in the Care Plan section)  Acute Rehab PT Goals Patient Stated Goal: Return home PT Goal Formulation: With patient Time For Goal Achievement:  09/19/24 Potential to Achieve Goals: Good    Frequency       Co-evaluation               AM-PAC PT 6 Clicks Mobility  Outcome Measure Help needed turning from your back to your side while in a flat bed without using bedrails?: None Help needed moving from lying on your back to sitting on the side of a flat bed without using bedrails?: None Help needed moving to and from a bed to a chair (including a wheelchair)?: None Help needed standing up from a chair using your arms (e.g., wheelchair or bedside chair)?: None Help needed to walk in hospital room?: A Little Help needed climbing 3-5 steps with a railing? : A Little 6 Click Score: 22    End of Session Equipment Utilized During Treatment: Oxygen  Activity Tolerance: Patient tolerated treatment well;Patient limited by fatigue Patient left: in bed;with call bell/phone within reach;with bed alarm set;with nursing/sitter in room Nurse Communication: Mobility status PT Visit Diagnosis: Unsteadiness on feet (R26.81);Difficulty in walking, not elsewhere classified (R26.2);Muscle weakness (generalized) (M62.81)    Time: 8577-8556 PT Time Calculation (min) (ACUTE ONLY): 21 min   Charges:   PT Evaluation $PT Eval Low Complexity: 1 Low   PT General Charges $$ ACUTE PT VISIT: 1 Visit  4:57 PM, 09/12/24 Rosaria Settler, PT, DPT [place] with [facility]

## 2024-09-12 NOTE — Progress Notes (Signed)
*  PRELIMINARY RESULTS* Echocardiogram 2D Echocardiogram has been performed.  Teresa Aida PARAS 09/12/2024, 2:11 PM

## 2024-09-12 NOTE — Progress Notes (Addendum)
 " PROGRESS NOTE  Alice Bowers, is a 74 y.o. female, DOB - April 19, 1951, FMW:[id number]  Admit date - 09/11/2024   Admitting Physician Brayton GORMAN Lye, MD  Outpatient Primary MD for the patient is Renella, April Y, NP  LOS - 1  Chief Complaint  Patient presents with   Influenza      Brief Narrative:  74 y.o. female, with medical history significant of anxiety, right breast cancer, postsurgery and radiation, hypertension, hyperlipidemia, OSA on CPAP, restless leg syndrome, history of multiple bilateral PE, with recent hospitalization 1/24 >> 08/2014 due to influenza infection, discharged on prednisone  taper, readmitted on 09/11/2024    -Assessment and Plan: Asthma/COPD exacerbation Influenza A infection/superimposed pneumonia -CTA chest without acute PE, it is consistent with multifocal pneumonia - Multiple antibiotic allergies, continue Levaquin  and vancomycin  -- Continue bronchodilators and mucolytics-  -Continue with IV Solu-Medrol ,  -- Consider de-escalating  Levaquin  and vancomycin  over the next 24 hours given lack of leukocytosis and negative PCT, ???  Pneumonia is Viral -MRSA swab is negative  Ongoing tobacco abuse--smoking cessation advised - Continue nicotine  patch  Acute hypoxic respiratory failure--due to above -Echo with EF of 60 to 65%, no RWMA,  no aortic stenosis -Currently weaned down to 2 L of oxygen  via nasal cannula   History of bilateral PE - Appears noncompliance with Eliquis  due to cost--TOC consult -CTA chest without acute PE -Continue Eliquis   History of breast cancer - S/p radiation and surgery, continue with anastrozole    - Morbid Obesity-/OSA -Low calorie diet, portion control and increase physical activity discussed with patient -Body mass index is 41.74 kg/m. - Continue CPAP nightly  obstructive sleep apnea  -continue with home CPAP    mixed hyperlipidemia  Continue atorvastatin   Hypothyroidism--- continue vitamin D  GERD--continue  Protonix   HTN--continue losartan  and HCTZ  Status is: Inpatient   Disposition: The patient is from: Home              Anticipated d/c is to: Home              Anticipated d/c date is: 2 days              Patient currently is not medically stable to d/c. Barriers: Not Clinically Stable-   Code Status :  -  Code Status: Full Code   Family Communication:    NA (patient is alert, awake and coherent)   DVT Prophylaxis  :   - SCDs    apixaban  (ELIQUIS ) tablet 5 mg   Lab Results  Component Value Date   PLT 212 09/12/2024    Inpatient Medications  Scheduled Meds:  anastrozole   1 mg Oral Daily   apixaban   5 mg Oral BID   atorvastatin   40 mg Oral Daily   budesonide -glycopyrrolate -formoterol   2 puff Inhalation BID   Chlorhexidine  Gluconate Cloth  6 each Topical Q0600   escitalopram   20 mg Oral Daily   hydrochlorothiazide   25 mg Oral Daily   ipratropium-albuterol   3 mL Nebulization Q6H   levothyroxine   25 mcg Oral QAC breakfast   losartan   100 mg Oral Daily   methylPREDNISolone  (SOLU-MEDROL ) injection  125 mg Intravenous Q12H   modafinil   200 mg Oral q morning   nicotine   21 mg Transdermal Daily   pantoprazole   40 mg Oral Daily   pramipexole   0.5 mg Oral TID   Continuous Infusions:  levofloxacin  (LEVAQUIN ) IV Stopped (09/11/24 2316)   vancomycin      PRN Meds:.acetaminophen  **OR** acetaminophen , albuterol , hydrALAZINE , nystatin ,  polyethylene glycol   Anti-infectives (From admission, onward)    Start     Dose/Rate Route Frequency Ordered Stop   09/12/24 2100  vancomycin  (VANCOREADY) IVPB 1250 mg/250 mL        1,250 mg 166.7 mL/hr over 90 Minutes Intravenous Every 24 hours 09/11/24 2036     09/11/24 2330  vancomycin  (VANCOCIN ) IVPB 1000 mg/200 mL premix       Placed in Followed by Linked Group   1,000 mg 200 mL/hr over 60 Minutes Intravenous  Once 09/11/24 2030 09/12/24 1443   09/11/24 2200  levofloxacin  (LEVAQUIN ) IVPB 750 mg        750 mg 100 mL/hr over 90 Minutes  Intravenous Every 24 hours 09/11/24 2034     09/11/24 2115  vancomycin  (VANCOREADY) IVPB 1500 mg/300 mL       Placed in Followed by Linked Group   1,500 mg 150 mL/hr over 120 Minutes Intravenous  Once 09/11/24 2030 09/12/24 1443         Subjective: Alice Bowers today has no fevers, no emesis,  No chest pain,    - Cough and dyspnea persist - Unable to wean off oxygen    Objective: Vitals:   09/12/24 0728 09/12/24 0826 09/12/24 1417 09/12/24 1450  BP: (!) 176/77   (!) 157/70  Pulse: 85   87  Resp: 20   (!) 22  Temp: 97.7 F (36.5 C)   98.2 F (36.8 C)  TempSrc: Oral   Oral  SpO2: 92% 94% 94% 94%  Weight:        Intake/Output Summary (Last 24 hours) at 09/12/2024 1919 Last data filed at 09/12/2024 9075 Gross per 24 hour  Intake 630 ml  Output --  Net 630 ml   Filed Weights   09/11/24 1224  Weight: 110.3 kg    Physical Exam  Gen:- Awake Alert, obese, dyspnea with minimal exertion HEENT:- [place].AT, No sclera icterus Nose- [place] 2L/min Neck-Supple Neck,No JVD,.  Lungs-diminished breath sounds with scattered rhonchi in lower fields and few wheezes in the upper fields CV- S1, S2 normal, regular  Abd-  +ve B.Sounds, Abd Soft, No tenderness, increased truncal adiposity    Extremity/Skin:- +ve  edema, pedal pulses present  Psych-affect is appropriate, oriented x3 Neuro-generalized weakness , no new focal deficits, no tremors  Data Reviewed: I have personally reviewed following labs and imaging studies  CBC: Recent Labs  Lab 09/07/24 1012 09/11/24 1304 09/12/24 0500  WBC 6.0 8.1 8.3  NEUTROABS 3.8 6.2  --   HGB 13.1 12.8 12.7  HCT 39.0 38.5 37.1  MCV 91.5 90.6 88.8  PLT 182 203 212   Basic Metabolic Panel: Recent Labs  Lab 09/07/24 1012 09/07/24 1043 09/08/24 0424 09/11/24 1304 09/12/24 0500  NA 135  --  137 141 137  K 3.2*  --  3.6 3.6 3.2*  CL 99  --  104 103 98  CO2 22  --  22 23 20*  GLUCOSE 100*  --  139* 81 222*  BUN 12  --  14 10 9    CREATININE 0.90  --  0.90 0.60 0.70  CALCIUM  9.1  --  8.9 8.6* 8.9  MG  --  1.9  --   --   --    GFR: Estimated Creatinine Clearance: 76 mL/min (by C-G formula based on SCr of 0.7 mg/dL). Liver Function Tests: Recent Labs  Lab 09/07/24 1012  AST 54*  ALT 21  ALKPHOS 106  BILITOT 0.4  PROT 6.4*  ALBUMIN  3.7   BNP (last 3 results) Recent Labs    09/11/24 1304  PROBNP 1,580.0*   Recent Results (from the past 240 hours)  Resp panel by RT-PCR (RSV, Flu A&B, Covid) Anterior Nasal Swab     Status: Abnormal   Collection Time: 09/07/24  9:53 AM   Specimen: Anterior Nasal Swab  Result Value Ref Range Status   SARS Coronavirus 2 by RT PCR NEGATIVE NEGATIVE Final    Comment: (NOTE) SARS-CoV-2 target nucleic acids are NOT DETECTED.  The SARS-CoV-2 RNA is generally detectable in upper respiratory specimens during the acute phase of infection. The lowest concentration of SARS-CoV-2 viral copies this assay can detect is 138 copies/mL. A negative result does not preclude SARS-Cov-2 infection and should not be used as the sole basis for treatment or other patient management decisions. A negative result may occur with  improper specimen collection/handling, submission of specimen other than nasopharyngeal swab, presence of viral mutation(s) within the areas targeted by this assay, and inadequate number of viral copies(<138 copies/mL). A negative result must be combined with clinical observations, patient history, and epidemiological information. The expected result is Negative.  Fact Sheet for Patients:  bloggercourse.com  Fact Sheet for Healthcare Providers:  seriousbroker.it  This test is no t yet approved or cleared by the [place]  FDA and  has been authorized for detection and/or diagnosis of SARS-CoV-2 by FDA under an Emergency Use Authorization (EUA). This EUA will remain  in effect (meaning this test can be used) for  the duration of the COVID-19 declaration under Section 564(b)(1) of the Act, 21 U.S.C.section 360bbb-3(b)(1), unless the authorization is terminated  or revoked sooner.       Influenza A by PCR POSITIVE (A) NEGATIVE Final   Influenza B by PCR NEGATIVE NEGATIVE Final    Comment: (NOTE) The Xpert Xpress SARS-CoV-2/FLU/RSV plus assay is intended as an aid in the diagnosis of influenza from Nasopharyngeal swab specimens and should not be used as a sole basis for treatment. Nasal washings and aspirates are unacceptable for Xpert Xpress SARS-CoV-2/FLU/RSV testing.  Fact Sheet for Patients: bloggercourse.com  Fact Sheet for Healthcare Providers: seriousbroker.it  This test is not yet approved or cleared by the [place]  FDA and has been authorized for detection and/or diagnosis of SARS-CoV-2 by FDA under an Emergency Use Authorization (EUA). This EUA will remain in effect (meaning this test can be used) for the duration of the COVID-19 declaration under Section 564(b)(1) of the Act, 21 U.S.C. section 360bbb-3(b)(1), unless the authorization is terminated or revoked.     Resp Syncytial Virus by PCR NEGATIVE NEGATIVE Final    Comment: (NOTE) Fact Sheet for Patients: bloggercourse.com  Fact Sheet for Healthcare Providers: seriousbroker.it  This test is not yet approved or cleared by the [place]  FDA and has been authorized for detection and/or diagnosis of SARS-CoV-2 by FDA under an Emergency Use Authorization (EUA). This EUA will remain in effect (meaning this test can be used) for the duration of the COVID-19 declaration under Section 564(b)(1) of the Act, 21 U.S.C. section 360bbb-3(b)(1), unless the authorization is terminated or revoked.  Performed at [facility], [address], [place], [place] [address]   Culture, blood (routine x 2)     Status: None   Collection  Time: 09/07/24 11:17 AM   Specimen: BLOOD  Result Value Ref Range Status   Specimen Description BLOOD LEFT ANTECUBITAL  Final   Special Requests AEROBIC BOTTLE ONLY Blood Culture adequate volume  Final   Culture  Final    NO GROWTH 5 DAYS Performed at [facility], [address], [place], [place] [address]    Report Status 09/12/2024 FINAL  Final  Culture, blood (routine x 2)     Status: None   Collection Time: 09/07/24 11:17 AM   Specimen: BLOOD  Result Value Ref Range Status   Specimen Description BLOOD BLOOD LEFT HAND  Final   Special Requests AEROBIC BOTTLE ONLY Blood Culture adequate volume  Final   Culture   Final    NO GROWTH 5 DAYS Performed at [facility], [address], [place], [place] [address]    Report Status 09/12/2024 FINAL  Final  MRSA Next Gen by PCR, Nasal     Status: None   Collection Time: 09/12/24  1:06 PM   Specimen: Nasal Mucosa; Nasal Swab  Result Value Ref Range Status   MRSA by PCR Next Gen NOT DETECTED NOT DETECTED Final    Comment: (NOTE) The GeneXpert MRSA Assay (FDA approved for NASAL specimens only), is one component of a comprehensive MRSA colonization surveillance program. It is not intended to diagnose MRSA infection nor to guide or monitor treatment for MRSA infections. Test performance is not FDA approved in patients less than 57 years old. Performed at [facility], [address] [place], [place] [address]     Radiology Studies: ECHOCARDIOGRAM COMPLETE Result Date: 09/12/2024    ECHOCARDIOGRAM REPORT   Patient Name:   Alice Bowers Date of Exam: 09/12/2024 Medical Rec #:  [id number]         Height:       64.0 in Accession #:    [id number]        Weight:       243.2 lb Date of Birth:  04-07-1951         BSA:          2.126 m Patient Age:    74 years          BP:           176/77 mmHg Patient Gender: F                 HR:           85 bpm. Exam Location:  [facility] Procedure: 2D Echo, Cardiac Doppler and Color Doppler (Both Spectral and  Color            Flow Doppler were utilized during procedure). Indications:    Dyspnea R06.00  History:        Patient has no prior history of Echocardiogram examinations.                 Risk Factors:Hypertension, Dyslipidemia and Current Smoker. Hx                 of OSA on CPAP.  Sonographer:    Aida Pizza RCS Referring Phys: [phone] Larrie Fraizer IMPRESSIONS  1. Left ventricular ejection fraction, by estimation, is 60 to 65%. The left ventricle has normal function. The left ventricle has no regional wall motion abnormalities. Left ventricular diastolic parameters were normal.  2. Right ventricular systolic function is normal. The right ventricular size is normal. Tricuspid regurgitation signal is inadequate for assessing PA pressure.  3. Left atrial size was mildly dilated.  4. The mitral valve is degenerative. Trivial mitral valve regurgitation.  5. The aortic valve is tricuspid. Aortic valve regurgitation is not visualized. Aortic valve sclerosis is present, with no evidence of aortic valve stenosis. Aortic valve  mean gradient measures 7.0 mmHg.  6. The inferior vena cava is dilated in size with >50% respiratory variability, suggesting right atrial pressure of 8 mmHg. Comparison(s): No prior Echocardiogram. FINDINGS  Left Ventricle: Left ventricular ejection fraction, by estimation, is 60 to 65%. The left ventricle has normal function. The left ventricle has no regional wall motion abnormalities. The left ventricular internal cavity size was normal in size. There is  borderline left ventricular hypertrophy. Left ventricular diastolic parameters were normal. Right Ventricle: The right ventricular size is normal. No increase in right ventricular wall thickness. Right ventricular systolic function is normal. Tricuspid regurgitation signal is inadequate for assessing PA pressure. Left Atrium: Left atrial size was mildly dilated. Right Atrium: Right atrial size was normal in size. Pericardium: There is no  evidence of pericardial effusion. Mitral Valve: The mitral valve is degenerative in appearance. Mild to moderate mitral annular calcification. Trivial mitral valve regurgitation. Tricuspid Valve: The tricuspid valve is grossly normal. Tricuspid valve regurgitation is trivial. Aortic Valve: The aortic valve is tricuspid. There is mild aortic valve annular calcification. Aortic valve regurgitation is not visualized. Aortic valve sclerosis is present, with no evidence of aortic valve stenosis. Aortic valve mean gradient measures  7.0 mmHg. Aortic valve peak gradient measures 14.1 mmHg. Aortic valve area, by VTI measures 2.39 cm. Pulmonic Valve: The pulmonic valve was not well visualized. Pulmonic valve regurgitation is trivial. Aorta: The aortic root is normal in size and structure. Venous: The inferior vena cava is dilated in size with greater than 50% respiratory variability, suggesting right atrial pressure of 8 mmHg. IAS/Shunts: No atrial level shunt detected by color flow Doppler. Additional Comments: 3D was performed not requiring image post processing on an independent workstation and was indeterminate.  LEFT VENTRICLE PLAX 2D LVIDd:         4.50 cm      Diastology LVIDs:         2.40 cm      LV e' medial:    12.30 cm/s LV PW:         1.00 cm      LV E/e' medial:  9.9 LV IVS:        1.10 cm      LV e' lateral:   14.80 cm/s LVOT diam:     2.00 cm      LV E/e' lateral: 8.2 LV SV:         100 LV SV Index:   47 LVOT Area:     3.14 cm  LV Volumes (MOD) LV vol d, MOD A2C: 108.0 ml LV vol d, MOD A4C: 104.0 ml LV vol s, MOD A2C: 29.6 ml LV vol s, MOD A4C: 41.4 ml LV SV MOD A2C:     78.4 ml LV SV MOD A4C:     104.0 ml LV SV MOD BP:      71.7 ml RIGHT VENTRICLE RV S prime:     16.00 cm/s TAPSE (M-mode): 3.0 cm LEFT ATRIUM             Index        RIGHT ATRIUM           Index LA diam:        3.60 cm 1.69 cm/m   RA Area:     18.50 cm LA Vol (A2C):   75.5 ml 35.52 ml/m  RA Volume:   55.50 ml  26.11 ml/m LA Vol (A4C):    71.6 ml 33.68 ml/m LA Biplane Vol: 74.9  ml 35.23 ml/m  AORTIC VALVE AV Area (Vmax):    2.81 cm AV Area (Vmean):   2.59 cm AV Area (VTI):     2.39 cm AV Vmax:           188.00 cm/s AV Vmean:          120.000 cm/s AV VTI:            0.419 m AV Peak Grad:      14.1 mmHg AV Mean Grad:      7.0 mmHg LVOT Vmax:         168.00 cm/s LVOT Vmean:        99.100 cm/s LVOT VTI:          0.319 m LVOT/AV VTI ratio: 0.76  AORTA Ao Root diam: 3.20 cm MITRAL VALVE MV Area (PHT): 3.37 cm     SHUNTS MV Decel Time: 225 msec     Systemic VTI:  0.32 m MV E velocity: 122.00 cm/s  Systemic Diam: 2.00 cm MV A velocity: 144.00 cm/s MV E/A ratio:  0.85 Jayson Sierras MD Electronically signed by Jayson Sierras MD Signature Date/Time: 09/12/2024/4:04:20 PM    Final    CT Angio Chest Pulmonary Embolism (PE) W or WO Contrast Result Date: 09/11/2024 CLINICAL DATA:  History of PE recent influenza short of breath EXAM: CT ANGIOGRAPHY CHEST WITH CONTRAST TECHNIQUE: Multidetector CT imaging of the chest was performed using the standard protocol during bolus administration of intravenous contrast. Multiplanar CT image reconstructions and MIPs were obtained to evaluate the vascular anatomy. RADIATION DOSE REDUCTION: This exam was performed according to the departmental dose-optimization program which includes automated exposure control, adjustment of the mA and/or kV according to patient size and/or use of iterative reconstruction technique. CONTRAST:  75mL OMNIPAQUE  IOHEXOL  350 MG/ML SOLN COMPARISON:  Chest x-ray 09/11/2024, CT 12/19/2022, 05/03/2022 FINDINGS: Cardiovascular: Satisfactory opacification of the pulmonary arteries to the segmental level. No evidence of pulmonary embolism. Mild aortic atherosclerosis. No aneurysm. Borderline cardiomegaly. No pericardial effusion. Left-sided central venous port with the tip at the SVC origin. Mediastinum/Nodes: Patent trachea. No suspicious thyroid mass. No suspicious lymph nodes. Esophagus within  normal limits Lungs/Pleura: Small bilateral pleural effusions. Suspect dependent atelectasis in the lower lobes. Heterogeneous bilateral ground-glass densities, worst in the upper lobes. Described nodules in the right upper lobe are obscured by airspace disease. Otherwise stable small scattered pulmonary nodules, for example 6 mm right lower lobe pulmonary nodule on series 11, image 57. 3 mm right lower lobe pulmonary nodule on series 11, image 37. 6 mm subpleural left lower lobe pulmonary nodule on series 11, image 81, also unchanged. Upper Abdomen: No acute finding Musculoskeletal: No acute osseous abnormality. Degenerative changes. Post mastectomy changes. Review of the MIP images confirms the above findings. IMPRESSION: 1. Negative for acute pulmonary embolus. 2. Small bilateral pleural effusions. Heterogeneous bilateral ground-glass densities, worst in the upper lobes, suspect for multifocal infection/pneumonia 3. Previously described right upper lobe pulmonary nodules are obscured by airspace disease. Otherwise stable small scattered pulmonary nodules. 4. Aortic atherosclerosis. Aortic Atherosclerosis (ICD10-I70.0). Electronically Signed   By: Luke Bun M.D.   On: 09/11/2024 18:48   DG Chest 2 View Result Date: 09/11/2024 EXAM: 2 VIEW(S) XRAY OF THE CHEST 09/11/2024 02:22:00 PM COMPARISON: 09/07/2024 CLINICAL HISTORY: Shortness of breath, positive influenza on 1-24. FINDINGS: LINES, TUBES AND DEVICES: Left subclavian Port-A-Cath with tip at SVC. LUNGS AND PLEURA: Bilateral interstitial and patchy airspace opacities. Trace bilateral pleural effusions. No pneumothorax. HEART AND MEDIASTINUM: No  acute abnormality of the cardiac and mediastinal silhouettes. 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 Right reverse total shoulder arthroplasty noted. IMPRESSION: 1. Bilateral interstitial and patchy airspace opacities, which may reflect edema or infection. 2. Trace bilateral  pleural effusions. Electronically signed by: Norleen Boxer MD 09/11/2024 02:45 PM EST RP Workstation: [id number]   Scheduled Meds:  anastrozole   1 mg Oral Daily   apixaban   5 mg Oral BID   atorvastatin   40 mg Oral Daily   budesonide -glycopyrrolate -formoterol   2 puff Inhalation BID   Chlorhexidine  Gluconate Cloth  6 each Topical Q0600   escitalopram   20 mg Oral Daily   hydrochlorothiazide   25 mg Oral Daily   ipratropium-albuterol   3 mL Nebulization Q6H   levothyroxine   25 mcg Oral QAC breakfast   losartan   100 mg Oral Daily   methylPREDNISolone  (SOLU-MEDROL ) injection  125 mg Intravenous Q12H   modafinil   200 mg Oral q morning   nicotine   21 mg Transdermal Daily   pantoprazole   40 mg Oral Daily   pramipexole   0.5 mg Oral TID   Continuous Infusions:  levofloxacin  (LEVAQUIN ) IV Stopped (09/11/24 2316)   vancomycin       LOS: 1 day   Rendall Carwin M.D on 09/12/2024 at 7:19 PM  Go to [email or web address] - for contact info  [facility] - Office  [phone]  If 7PM-7AM, please contact night-coverage [email or web address] 09/12/2024, 7:19 PM    "

## 2024-09-13 ENCOUNTER — Other Ambulatory Visit (HOSPITAL_COMMUNITY): Payer: Self-pay

## 2024-09-13 ENCOUNTER — Telehealth (HOSPITAL_COMMUNITY): Payer: Self-pay

## 2024-09-13 DIAGNOSIS — J45901 Unspecified asthma with (acute) exacerbation: Secondary | ICD-10-CM | POA: Diagnosis not present

## 2024-09-13 LAB — GLUCOSE, CAPILLARY
Glucose-Capillary: 189 mg/dL — ABNORMAL HIGH (ref 70–99)
Glucose-Capillary: 135 mg/dL — ABNORMAL HIGH (ref 70–99)

## 2024-09-13 MED ORDER — MIRTAZAPINE 15 MG PO TABS
15.0000 mg | ORAL_TABLET | Freq: Every day | ORAL | Status: DC
  Administered 2024-09-13 – 2024-09-15 (×3): 15 mg via ORAL
  Filled 2024-09-13 (×3): qty 1

## 2024-09-13 MED ORDER — DIPHENHYDRAMINE HCL 25 MG PO CAPS: 25.0000 mg | ORAL_CAPSULE | Freq: Every evening | ORAL | Status: DC | PRN

## 2024-09-13 NOTE — Plan of Care (Signed)
   Problem: Education: Goal: Knowledge of General Education information will improve Description: Including pain rating scale, medication(s)/side effects and non-pharmacologic comfort measures Outcome: Progressing   Problem: Activity: Goal: Risk for activity intolerance will decrease Outcome: Progressing   Problem: Nutrition: Goal: Adequate nutrition will be maintained Outcome: Progressing

## 2024-09-13 NOTE — Progress Notes (Signed)
 [facility] PROGRESS NOTE  Alice Bowers (DOB: [birth year]) FMW:[id number] PCP: [name], NP  Brief Narrative: Alice Bowers is a 74 y.o. female with a history of anxiety, right breast cancer, postsurgery and radiation, hypertension, hyperlipidemia, OSA on CPAP, restless leg syndrome, history of multiple bilateral PE, with recent hospitalization 1/24 - 1/26 due to influenza infection, discharged on prednisone  taper, readmitted on 09/11/2024 for ongoing shortness of breath consistent with asthma/COPD exacerbation.   Subjective: Breathing better, but not near baseline.   Objective: BP (!) 154/81 (BP Location: Left Arm)   Pulse 78   Temp 97.6 F (36.4 C) (Oral)   Resp 18   Wt 110.3 kg   SpO2 96%   BMI 41.74 kg/m   Gen: No distress, obese female Pulm: End-expiratory wheezing, no crackles, nonlabored at rest  CV: RRR, no MRG or edema GI: Soft, NT, ND, +BS Neuro: Alert and oriented. No new focal deficits. Ext: Warm, no deformities. Skin: No new rashes, lesions or ulcers on visualized skin   Assessment & Plan: Principal Problem:   Asthma exacerbation Active Problems:   OSA on CPAP   Mixed hyperlipidemia   Obesity, Class III, BMI 40-49.9 (morbid obesity) (HCC)   Restless legs syndrome (RLS)   Tobacco dependence   Malignant neoplasm of upper-outer quadrant of right female breast (HCC)   Influenza A  Asthma/COPD exacerbation Influenza A infection/superimposed pneumonia -CTA chest without acute PE, it is consistent with multifocal pneumonia. Negative MRSA PCR, DC vancomycin . Will continue levaquin  given her double worsening, though PCT reassuring.  -- Continue bronchodilators and mucolytics-  -Continue with IV Solu-Medrol     Ongoing tobacco abuse--smoking cessation advised - Continue nicotine  patch  Hypokalemia:  - Supplemented, will recheck in AM.    Acute hypoxic respiratory failure--due to above -Echo with EF of 60 to 65%, no RWMA,  no aortic  stenosis -Currently weaned down to 2 L of oxygen  via nasal cannula   History of bilateral PE: Cost-prohibitive DOAC leads to nonadherence. CTA showed no PE this admission.  - Continue eliquis , pharmacy benefit check, will provide 30-day card. If this is not remediable, would suggest coumadin.    History of breast cancer:S/p radiation and surgery - Continue with anastrozole    Class II obesity: Body mass index is 41.74 kg/m.  - Weight loss recommended.   OSA:  - Continue with home CPAP    Hyperlipidemia  - Continue atorvastatin    Hypothyroidism--- continue vitamin D   GERD--continue Protonix    HTN--continue losartan  and HCTZ  Insomnia:  - Try remeron  (trazodone  wasn't effective). Pt accepts risks of benadryl  if that is ineffective tonight.   Bernardino KATHEE Come, MD [facility] [email or web address] 09/13/2024, 2:31 PM

## 2024-09-13 NOTE — Telephone Encounter (Signed)
 Pharmacy Patient Advocate Encounter  Insurance verification completed.    The patient is insured through [employer]. Patient has Medicare and is not eligible for a copay card, but may be able to apply for patient assistance or Medicare RX Payment Plan (Patient Must reach out to their plan, if eligible for payment plan), if available.    Ran test claim for Eliquis 5mg  tablet and the current 30 day co-pay is $249 due to deductible.   This test claim was processed through [facility]- copay amounts may vary at other pharmacies due to pharmacy/plan contracts, or as the patient moves through the different stages of their insurance plan.

## 2024-09-14 DIAGNOSIS — J45901 Unspecified asthma with (acute) exacerbation: Secondary | ICD-10-CM | POA: Diagnosis not present

## 2024-09-14 LAB — BASIC METABOLIC PANEL WITH GFR
Anion gap: 15 (ref 5–15)
BUN: 15 mg/dL (ref 8–23)
CO2: 28 mmol/L (ref 22–32)
Calcium: 9.6 mg/dL (ref 8.9–10.3)
Chloride: 100 mmol/L (ref 98–111)
Creatinine, Ser: 0.79 mg/dL (ref 0.44–1.00)
GFR, Estimated: >60 mL/min
Glucose, Bld: 161 mg/dL — ABNORMAL HIGH (ref 70–99)
Potassium: 3.5 mmol/L (ref 3.5–5.1)
Sodium: 143 mmol/L (ref 135–145)

## 2024-09-14 LAB — GLUCOSE, CAPILLARY
Glucose-Capillary: 140 mg/dL — ABNORMAL HIGH (ref 70–99)
Glucose-Capillary: 144 mg/dL — ABNORMAL HIGH (ref 70–99)
Glucose-Capillary: 152 mg/dL — ABNORMAL HIGH (ref 70–99)
Glucose-Capillary: 190 mg/dL — ABNORMAL HIGH (ref 70–99)
Glucose-Capillary: 195 mg/dL — ABNORMAL HIGH (ref 70–99)

## 2024-09-14 MED ORDER — METHYLPREDNISOLONE SODIUM SUCC 40 MG IJ SOLR
40.0000 mg | Freq: Two times a day (BID) | INTRAMUSCULAR | Status: DC
  Administered 2024-09-14 – 2024-09-15 (×2): 40 mg via INTRAVENOUS
  Filled 2024-09-14 (×2): qty 1

## 2024-09-14 MED ORDER — POLYVINYL ALCOHOL 1.4 % OP SOLN
1.0000 [drp] | OPHTHALMIC | Status: DC | PRN
  Administered 2024-09-14 – 2024-09-15 (×3): 1 [drp] via OPHTHALMIC
  Filled 2024-09-14: qty 15

## 2024-09-14 NOTE — Progress Notes (Signed)
 [facility] PROGRESS NOTE  Alice Bowers (DOB: 04/20/1951) FMW:[id number] PCP: [name], NP  Brief Narrative: Alice Bowers is a 74 y.o. female with a history of anxiety, right breast cancer, postsurgery and radiation, hypertension, hyperlipidemia, OSA on CPAP, restless leg syndrome, history of multiple bilateral PE, with recent hospitalization 1/24 - 1/26 due to influenza infection, discharged on prednisone  taper, readmitted on 09/11/2024 for ongoing shortness of breath consistent with asthma/COPD exacerbation.   Subjective: Continues to feel breathing is better day by day but still not near baseline. She's severely short of breath with any exertion, no chest pain. Confirmed SpO2 into mid 80%'s with exertion. Wheezing this morning shortly after neb. No chest pain.   Objective: BP 132/61 (BP Location: Left Arm)   Pulse 67   Temp 97.8 F (36.6 C) (Oral)   Resp 14   Wt 110.3 kg   SpO2 94%   BMI 41.74 kg/m   Gen: No distress Pulm: Nonlabored but tachypneic with pan expiratory wheezing in all fields.   CV: RRR, no MRG.  GI: Soft, NT, ND, +BS  Neuro: Alert and oriented. No new focal deficits. Slightly tremulous generally having had breathing treatment.  Ext: Warm, no deformities. Skin: No rashes, lesions or ulcers on visualized skin   Assessment & Plan: Principal Problem:   Asthma exacerbation Active Problems:   OSA on CPAP   Mixed hyperlipidemia   Obesity, Class III, BMI 40-49.9 (morbid obesity) (HCC)   Restless legs syndrome (RLS)   Tobacco dependence   Malignant neoplasm of upper-outer quadrant of right female breast (HCC)   Influenza A  Asthma/COPD exacerbation Influenza A infection/superimposed multifocal pneumonia: Echo reassuring, no PE by CTA. Negative MRSA PCR, DC vancomycin .  - Will continue levaquin  given her double worsening, though PCT reassuring.  - Continue mucolytics - Continue IV steroid  - Continue breztri , scheduled duoneb, prn albuterol .     Ongoing tobacco abuse--smoking cessation advised - Continue nicotine  patch  Hypokalemia:  - Supplemented, normalized.   Acute hypoxic respiratory failure: - Remains exertionally hypoxemic this morning. Exam consistent with bronchospasm as etiology. Continue maintaining SpO2 >90% with normal WOB, weaning anticipated once wheezing resolves.    History of bilateral PE: Cost-prohibitive DOAC leads to nonadherence. CTA showed no PE this admission.  - Continue eliquis , pharmacy benefit check, will provide 30-day card. If this is not remediable, would suggest coumadin.    History of breast cancer:S/p radiation and surgery - Continue with anastrozole    Class II obesity: Body mass index is 41.74 kg/m.  - Weight loss recommended.   OSA:  - Continue with home CPAP    Hyperlipidemia  - Continue atorvastatin    Hypothyroidism:  - Continue low dose synthroid .   GERD--continue Protonix    HTN--continue losartan  and HCTZ  Insomnia:  - Continue (new) remeron  (trazodone  wasn't effective). Pt accepts risks of benadryl  if that is ineffective tonight.   Alice KATHEE Come, MD [facility] [email or web address] 09/14/2024, 10:19 AM

## 2024-09-14 NOTE — Progress Notes (Signed)
 Ambulated in back hallway about 100 feet round trip on room air and was 91% but had dyspnea with walking.

## 2024-09-15 DIAGNOSIS — J45901 Unspecified asthma with (acute) exacerbation: Secondary | ICD-10-CM | POA: Diagnosis not present

## 2024-09-15 LAB — GLUCOSE, CAPILLARY
Glucose-Capillary: 179 mg/dL — ABNORMAL HIGH (ref 70–99)
Glucose-Capillary: 169 mg/dL — ABNORMAL HIGH (ref 70–99)

## 2024-09-15 MED ORDER — PREDNISONE 20 MG PO TABS
40.0000 mg | ORAL_TABLET | Freq: Every day | ORAL | Status: DC
  Administered 2024-09-16: 40 mg via ORAL
  Filled 2024-09-15: qty 2

## 2024-09-15 NOTE — ED Provider Notes (Signed)
 Room air at rest was 94-95%.  Ambulated around entire back hall horseshoe on room air and stayed 91% and above.  Back in chair recovered to 95-96%.  Tolerated walking well.

## 2024-09-15 NOTE — Progress Notes (Signed)
 [facility] PROGRESS NOTE  Alice Bowers (DOB: Nov 07, 1950) FMW:[id number] PCP: [name], NP  Brief Narrative: Alice Bowers is a 74 y.o. female with a history of anxiety, right breast cancer, postsurgery and radiation, hypertension, hyperlipidemia, OSA on CPAP, restless leg syndrome, history of multiple bilateral PE, with recent hospitalization 1/24 - 1/26 due to influenza infection, discharged on prednisone  taper, readmitted on 09/11/2024 for ongoing shortness of breath consistent with asthma/COPD exacerbation.   Subjective: Overall improved, very short of breath with exertion. Wheezing has resolved per pt. No chest pain.   Objective: BP (!) 146/96 (BP Location: Left Arm)   Pulse 71   Temp (!) 97.5 F (36.4 C)   Resp 14   Wt 110.3 kg   SpO2 95%   BMI 41.74 kg/m   Gen: No distress Pulm: Nonlabored, has upper airway expiratory sounds that resolve with normal breathing. No lower wheezing or crackles.   CV: RRR GI: Soft, NT, ND, +BS  Neuro: Alert and oriented. No new focal deficits. Ext: Warm, no deformities. Skin: No rashes, lesions or ulcers on visualized skin   Assessment & Plan: Principal Problem:   Asthma exacerbation Active Problems:   OSA on CPAP   Mixed hyperlipidemia   Obesity, Class III, BMI 40-49.9 (morbid obesity) (HCC)   Restless legs syndrome (RLS)   Tobacco dependence   Malignant neoplasm of upper-outer quadrant of right female breast (HCC)   Influenza A  Asthma/COPD exacerbation Influenza A infection/superimposed multifocal pneumonia: Echo reassuring, no PE by CTA. Negative MRSA PCR, DC vancomycin .  - Will continue levaquin  given her double worsening, though PCT reassuring.  - Continue mucolytics - Wheezing finally resolved - will taper steroids.  - Continue breztri , scheduled duoneb, prn albuterol .    Ongoing tobacco abuse--smoking cessation advised - Continue nicotine  patch  Hypokalemia:  - Supplemented, normalized.   Acute  hypoxic respiratory failure: - Ambulatory pulse oximetry showed SpO2 to 91% with significant dyspnea which is not baseline. This patient is a bounceback readmission. Will taper steroids and anticipate DC 2/2 if improvement continues.    History of bilateral PE: Cost-prohibitive DOAC leads to nonadherence. CTA showed no PE this admission.  - Continue eliquis , pharmacy benefit check, will provide 30-day card. If this is not remediable, would suggest coumadin.    History of breast cancer:S/p radiation and surgery - Continue with anastrozole    Class II obesity: Body mass index is 41.74 kg/m.  - Weight loss recommended.   OSA:  - Continue with home CPAP    Hyperlipidemia  - Continue atorvastatin    Hypothyroidism:  - Continue low dose synthroid .   GERD--continue Protonix    HTN--continue losartan  and HCTZ  Insomnia:  - Continue (new) remeron  (trazodone  wasn't effective). Pt accepts risks of benadryl  if that is ineffective tonight.   Bernardino KATHEE Come, MD [email or web address] 09/15/2024, 1:02 PM

## 2024-09-16 LAB — GLUCOSE, CAPILLARY: Glucose-Capillary: 131 mg/dL — ABNORMAL HIGH (ref 70–99)

## 2024-09-16 MED ORDER — ELIQUIS 5 MG PO TABS: 5.0000 mg | ORAL_TABLET | Freq: Two times a day (BID) | ORAL | 0 refills | Status: AC

## 2024-09-16 MED ORDER — HEPARIN SOD (PORK) LOCK FLUSH 100 UNIT/ML IV SOLN
500.0000 [IU] | Freq: Once | INTRAVENOUS | Status: AC
  Administered 2024-09-16: 500 [IU] via INTRAVENOUS
  Filled 2024-09-16: qty 5

## 2024-09-16 MED ORDER — IPRATROPIUM-ALBUTEROL 0.5-2.5 (3) MG/3ML IN SOLN: 3.0000 mL | Freq: Four times a day (QID) | RESPIRATORY_TRACT | 0 refills | Status: AC | PRN

## 2024-09-16 MED ORDER — PREDNISONE 10 MG PO TABS: ORAL_TABLET | ORAL | 0 refills | Status: AC

## 2024-09-16 NOTE — Plan of Care (Signed)
  Problem: Clinical Measurements: Goal: Will remain free from infection Outcome: Progressing Goal: Diagnostic test results will improve Outcome: Progressing   Problem: Coping: Goal: Level of anxiety will decrease Outcome: Progressing   Problem: Skin Integrity: Goal: Risk for impaired skin integrity will decrease Outcome: Progressing

## 2024-09-16 NOTE — Care Management Important Message (Signed)
 Important Message  Patient Details  Name: Alice Bowers MRN: [id number] Date of Birth: October 31, 1950   Important Message Given:  Yes - Medicare IM (copy mailed to address on file)     [name] Ada 09/16/2024, 11:51 AM

## 2024-09-16 NOTE — Progress Notes (Signed)
 Pt rested well through the night and had no complaints of SOB while sleeping or ambulating.

## 2024-09-19 ENCOUNTER — Ambulatory Visit (HOSPITAL_COMMUNITY): Admitting: Licensed Clinical Social Worker

## 2024-10-10 ENCOUNTER — Ambulatory Visit (HOSPITAL_COMMUNITY): Admitting: Licensed Clinical Social Worker

## 2024-11-13 ENCOUNTER — Ambulatory Visit: Admitting: Cardiology
